# Patient Record
Sex: Female | Born: 1945
Health system: Southern US, Community
[De-identification: ages and names within clinical notes are randomized; demographics above are authoritative.]

## PROBLEM LIST (undated history)

## (undated) DIAGNOSIS — I10 Essential (primary) hypertension: Secondary | ICD-10-CM

## (undated) DIAGNOSIS — F419 Anxiety disorder, unspecified: Secondary | ICD-10-CM

## (undated) DIAGNOSIS — G4730 Sleep apnea, unspecified: Secondary | ICD-10-CM

## (undated) DIAGNOSIS — F32A Depression, unspecified: Secondary | ICD-10-CM

## (undated) DIAGNOSIS — T4145XA Adverse effect of unspecified anesthetic, initial encounter: Secondary | ICD-10-CM

## (undated) DIAGNOSIS — E785 Hyperlipidemia, unspecified: Secondary | ICD-10-CM

## (undated) DIAGNOSIS — T8859XA Other complications of anesthesia, initial encounter: Secondary | ICD-10-CM

## (undated) DIAGNOSIS — I639 Cerebral infarction, unspecified: Secondary | ICD-10-CM

## (undated) DIAGNOSIS — M8580 Other specified disorders of bone density and structure, unspecified site: Secondary | ICD-10-CM

## (undated) DIAGNOSIS — F329 Major depressive disorder, single episode, unspecified: Secondary | ICD-10-CM

## (undated) DIAGNOSIS — F0390 Unspecified dementia without behavioral disturbance: Secondary | ICD-10-CM

## (undated) DIAGNOSIS — M1990 Unspecified osteoarthritis, unspecified site: Secondary | ICD-10-CM

## (undated) HISTORY — PX: ROTATOR CUFF REPAIR: SHX139

## (undated) HISTORY — DX: Essential (primary) hypertension: I10

## (undated) HISTORY — PX: INTRACAPSULAR CATARACT EXTRACTION: SHX361

## (undated) HISTORY — DX: Hyperlipidemia, unspecified: E78.5

## (undated) HISTORY — DX: Cerebral infarction, unspecified: I63.9

## (undated) HISTORY — PX: COLONOSCOPY: SHX174

## (undated) HISTORY — DX: Unspecified dementia without behavioral disturbance: F03.90

## (undated) HISTORY — PX: TUBAL LIGATION: SHX77

## (undated) HISTORY — DX: Major depressive disorder, single episode, unspecified: F32.9

## (undated) HISTORY — DX: Other specified disorders of bone density and structure, unspecified site: M85.80

## (undated) HISTORY — DX: Depression, unspecified: F32.A

---
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
## 1997-07-11 ENCOUNTER — Other Ambulatory Visit: Admission: RE | Admit: 1997-07-11 | Discharge: 1997-07-11 | Payer: Self-pay | Admitting: Obstetrics and Gynecology

## 1998-02-18 ENCOUNTER — Other Ambulatory Visit: Admission: RE | Admit: 1998-02-18 | Discharge: 1998-02-18 | Payer: Self-pay | Admitting: Obstetrics and Gynecology

## 1998-09-01 ENCOUNTER — Other Ambulatory Visit: Admission: RE | Admit: 1998-09-01 | Discharge: 1998-09-01 | Payer: Self-pay | Admitting: Obstetrics and Gynecology

## 1999-03-23 ENCOUNTER — Other Ambulatory Visit: Admission: RE | Admit: 1999-03-23 | Discharge: 1999-03-23 | Payer: Self-pay | Admitting: Obstetrics and Gynecology

## 1999-06-22 ENCOUNTER — Encounter: Admission: RE | Admit: 1999-06-22 | Discharge: 1999-06-22 | Payer: Self-pay | Admitting: Obstetrics and Gynecology

## 1999-06-22 ENCOUNTER — Encounter: Payer: Self-pay | Admitting: Obstetrics and Gynecology

## 2000-04-08 ENCOUNTER — Other Ambulatory Visit: Admission: RE | Admit: 2000-04-08 | Discharge: 2000-04-08 | Payer: Self-pay | Admitting: Obstetrics and Gynecology

## 2000-06-23 ENCOUNTER — Encounter: Payer: Self-pay | Admitting: Obstetrics and Gynecology

## 2000-06-23 ENCOUNTER — Encounter: Admission: RE | Admit: 2000-06-23 | Discharge: 2000-06-23 | Payer: Self-pay | Admitting: Obstetrics and Gynecology

## 2001-04-14 ENCOUNTER — Other Ambulatory Visit: Admission: RE | Admit: 2001-04-14 | Discharge: 2001-04-14 | Payer: Self-pay | Admitting: Obstetrics and Gynecology

## 2001-07-13 ENCOUNTER — Encounter: Admission: RE | Admit: 2001-07-13 | Discharge: 2001-07-13 | Payer: Self-pay | Admitting: Obstetrics and Gynecology

## 2001-07-13 ENCOUNTER — Encounter: Payer: Self-pay | Admitting: Obstetrics and Gynecology

## 2002-06-14 ENCOUNTER — Other Ambulatory Visit: Admission: RE | Admit: 2002-06-14 | Discharge: 2002-06-14 | Payer: Self-pay | Admitting: Obstetrics and Gynecology

## 2002-11-28 ENCOUNTER — Encounter: Payer: Self-pay | Admitting: Obstetrics and Gynecology

## 2002-11-28 ENCOUNTER — Encounter: Admission: RE | Admit: 2002-11-28 | Discharge: 2002-11-28 | Payer: Self-pay | Admitting: Obstetrics and Gynecology

## 2003-08-06 ENCOUNTER — Other Ambulatory Visit: Admission: RE | Admit: 2003-08-06 | Discharge: 2003-08-06 | Payer: Self-pay | Admitting: Obstetrics and Gynecology

## 2003-12-03 ENCOUNTER — Encounter: Admission: RE | Admit: 2003-12-03 | Discharge: 2003-12-03 | Payer: Self-pay | Admitting: Obstetrics and Gynecology

## 2004-10-16 ENCOUNTER — Other Ambulatory Visit: Admission: RE | Admit: 2004-10-16 | Discharge: 2004-10-16 | Payer: Self-pay | Admitting: Addiction Medicine

## 2004-12-23 ENCOUNTER — Encounter: Admission: RE | Admit: 2004-12-23 | Discharge: 2004-12-23 | Payer: Self-pay | Admitting: Obstetrics and Gynecology

## 2005-01-04 ENCOUNTER — Encounter: Admission: RE | Admit: 2005-01-04 | Discharge: 2005-01-04 | Payer: Self-pay | Admitting: Obstetrics and Gynecology

## 2005-10-19 ENCOUNTER — Other Ambulatory Visit: Admission: RE | Admit: 2005-10-19 | Discharge: 2005-10-19 | Payer: Self-pay | Admitting: Obstetrics and Gynecology

## 2005-12-03 ENCOUNTER — Ambulatory Visit: Payer: Self-pay | Admitting: Internal Medicine

## 2005-12-13 ENCOUNTER — Ambulatory Visit: Payer: Self-pay | Admitting: Internal Medicine

## 2005-12-29 ENCOUNTER — Encounter: Admission: RE | Admit: 2005-12-29 | Discharge: 2005-12-29 | Payer: Self-pay | Admitting: Obstetrics and Gynecology

## 2006-10-25 ENCOUNTER — Other Ambulatory Visit: Admission: RE | Admit: 2006-10-25 | Discharge: 2006-10-25 | Payer: Self-pay | Admitting: Obstetrics and Gynecology

## 2006-10-25 ENCOUNTER — Encounter: Payer: Self-pay | Admitting: Obstetrics and Gynecology

## 2007-01-25 ENCOUNTER — Encounter: Admission: RE | Admit: 2007-01-25 | Discharge: 2007-01-25 | Payer: Self-pay | Admitting: Obstetrics and Gynecology

## 2007-12-19 ENCOUNTER — Other Ambulatory Visit: Admission: RE | Admit: 2007-12-19 | Discharge: 2007-12-19 | Payer: Self-pay | Admitting: Obstetrics and Gynecology

## 2008-01-29 ENCOUNTER — Encounter: Admission: RE | Admit: 2008-01-29 | Discharge: 2008-01-29 | Payer: Self-pay | Admitting: Internal Medicine

## 2008-12-25 ENCOUNTER — Ambulatory Visit: Payer: Self-pay | Admitting: Obstetrics and Gynecology

## 2008-12-25 ENCOUNTER — Other Ambulatory Visit: Admission: RE | Admit: 2008-12-25 | Discharge: 2008-12-25 | Payer: Self-pay | Admitting: Obstetrics and Gynecology

## 2008-12-25 ENCOUNTER — Encounter: Payer: Self-pay | Admitting: Obstetrics and Gynecology

## 2009-02-03 ENCOUNTER — Encounter: Admission: RE | Admit: 2009-02-03 | Discharge: 2009-02-03 | Payer: Self-pay | Admitting: Internal Medicine

## 2009-12-29 ENCOUNTER — Other Ambulatory Visit: Admission: RE | Admit: 2009-12-29 | Discharge: 2009-12-29 | Payer: Self-pay | Admitting: Obstetrics and Gynecology

## 2009-12-29 ENCOUNTER — Ambulatory Visit: Payer: Self-pay | Admitting: Obstetrics and Gynecology

## 2010-02-04 ENCOUNTER — Encounter: Admission: RE | Admit: 2010-02-04 | Discharge: 2010-02-04 | Payer: Self-pay | Admitting: Internal Medicine

## 2010-05-31 ENCOUNTER — Encounter: Payer: Self-pay | Admitting: Obstetrics and Gynecology

## 2010-12-31 ENCOUNTER — Other Ambulatory Visit (HOSPITAL_COMMUNITY)
Admission: RE | Admit: 2010-12-31 | Discharge: 2010-12-31 | Disposition: A | Discharge status: Home / Self Care | Payer: BC Managed Care – PPO | Source: Ambulatory Visit | Attending: Obstetrics and Gynecology | Admitting: Obstetrics and Gynecology

## 2010-12-31 ENCOUNTER — Encounter: Payer: Self-pay | Admitting: Obstetrics and Gynecology

## 2010-12-31 ENCOUNTER — Ambulatory Visit (INDEPENDENT_AMBULATORY_CARE_PROVIDER_SITE_OTHER): Payer: BC Managed Care – PPO | Admitting: Obstetrics and Gynecology

## 2010-12-31 VITALS — BP 110/64 | Ht 63.5 in | Wt 149.0 lb

## 2010-12-31 DIAGNOSIS — N952 Postmenopausal atrophic vaginitis: Secondary | ICD-10-CM

## 2010-12-31 DIAGNOSIS — I10 Essential (primary) hypertension: Secondary | ICD-10-CM | POA: Insufficient documentation

## 2010-12-31 DIAGNOSIS — M8580 Other specified disorders of bone density and structure, unspecified site: Secondary | ICD-10-CM | POA: Insufficient documentation

## 2010-12-31 DIAGNOSIS — Z01419 Encounter for gynecological examination (general) (routine) without abnormal findings: Secondary | ICD-10-CM

## 2010-12-31 DIAGNOSIS — N951 Menopausal and female climacteric states: Secondary | ICD-10-CM

## 2010-12-31 DIAGNOSIS — M899 Disorder of bone, unspecified: Secondary | ICD-10-CM

## 2010-12-31 DIAGNOSIS — Z780 Asymptomatic menopausal state: Secondary | ICD-10-CM

## 2010-12-31 DIAGNOSIS — E785 Hyperlipidemia, unspecified: Secondary | ICD-10-CM | POA: Insufficient documentation

## 2010-12-31 DIAGNOSIS — M949 Disorder of cartilage, unspecified: Secondary | ICD-10-CM

## 2010-12-31 NOTE — Progress Notes (Signed)
Patient came to see me today for further annual GYN care. She has vaginal dryness due to atrophic vaginitis but is not sexually active so does not need intervention. We are treating her with Prozac. She initially took it for PMS but she is now using a for menopausal symptoms with excellent results. She has osteopenia on bone density and is now doing them through her PCPs office. She takes calcium and D. She has had no fractures. She does not have loss of urine with stress. She is minimal nocturia. She is having no vaginal bleeding. She is having no pelvic pain. She is up-to-date on mammograms.  Past medical history, family history, and social history reviewed and in record.  ROS: 9 system ROS done. Other than pertinent positives listed above and hypertension and hypercholesterolemia there has been no change.  Physical examination: HEENT within normal limits. Neck: Thyroid not large. No masses. Supraclavicular nodes: not enlarged. Breasts: Examined in both sitting midline position. No skin changes and no masses. Abdomen: Soft no guarding rebound or masses or hernia. Pelvic: External: Within normal limits. BUS: Within normal limits. Vaginal:within normal limits. Good estrogen effect. No evidence of cystocele rectocele or enterocele. Cervix: clean. Uterus: Normal size and shape. Adnexa: No masses. Rectovaginal exam: Confirmatory and negative. Extremities: Within normal limits.  Assessment: 1. Menopausal symptoms 2. Atrophic vaginitis 3. Osteopenia  Plan: Continue Prozac. Continue mammograms.

## 2011-01-13 ENCOUNTER — Other Ambulatory Visit: Payer: Self-pay | Admitting: Internal Medicine

## 2011-01-13 DIAGNOSIS — Z1231 Encounter for screening mammogram for malignant neoplasm of breast: Secondary | ICD-10-CM

## 2011-02-08 ENCOUNTER — Ambulatory Visit
Admission: RE | Admit: 2011-02-08 | Discharge: 2011-02-08 | Disposition: A | Discharge status: Home / Self Care | Payer: BC Managed Care – PPO | Source: Ambulatory Visit | Attending: Internal Medicine | Admitting: Internal Medicine

## 2011-02-08 DIAGNOSIS — Z1231 Encounter for screening mammogram for malignant neoplasm of breast: Secondary | ICD-10-CM

## 2011-04-21 DIAGNOSIS — H268 Other specified cataract: Secondary | ICD-10-CM | POA: Insufficient documentation

## 2011-11-22 DIAGNOSIS — H40019 Open angle with borderline findings, low risk, unspecified eye: Secondary | ICD-10-CM | POA: Insufficient documentation

## 2012-01-03 ENCOUNTER — Encounter: Payer: Self-pay | Admitting: Obstetrics and Gynecology

## 2012-01-03 ENCOUNTER — Ambulatory Visit (INDEPENDENT_AMBULATORY_CARE_PROVIDER_SITE_OTHER): Payer: BC Managed Care – PPO | Admitting: Obstetrics and Gynecology

## 2012-01-03 VITALS — BP 124/78 | Ht 63.0 in | Wt 145.0 lb

## 2012-01-03 DIAGNOSIS — Z01419 Encounter for gynecological examination (general) (routine) without abnormal findings: Secondary | ICD-10-CM

## 2012-01-03 NOTE — Patient Instructions (Signed)
Schedule mammogram in September.

## 2012-01-03 NOTE — Progress Notes (Signed)
Patient came to see me today for her annual GYN exam. She is doing well without hormone replacement. She is having no vaginal bleeding. She is having no pelvic pain. She does her lab through PCP. She does have osteopenia but at the moment has not started medication. Last December she had a fall and tore  her right rotator cuff and the muscles around it. She has recuperated well. She's never had a abnormal Pap smear. Her last Pap smear was 2012. If she waits too long to go to the bathroom she does get some urgency and almost is incontinent. She has no nocturia. She has no dysuria.  Physical examination: Pamela Mason gardner present. HEENT within normal limits. Neck: Thyroid not large. No masses. Supraclavicular nodes: not enlarged. Breasts: Examined in both sitting and lying  position. No skin changes and no masses. Abdomen: Soft no guarding rebound or masses or hernia. Pelvic: External: Within normal limits. BUS: Within normal limits. Vaginal:within normal limits. Good estrogen effect. No evidence of cystocele rectocele or enterocele. Cervix: clean. Uterus: Normal size and shape. Adnexa: No masses. Rectovaginal exam: Confirmatory and negative. Extremities: Within normal limits.  Assessment: #1. Osteopenia #2. Urinary urgency  Plan: Mammogram in September. Continue bone densities with Dr. Waynard Mason. For the moment no treatment needed for urgency.The new Pap smear guidelines were discussed with the patient. No pap done.

## 2012-01-19 ENCOUNTER — Other Ambulatory Visit: Payer: Self-pay | Admitting: Internal Medicine

## 2012-01-19 DIAGNOSIS — Z1231 Encounter for screening mammogram for malignant neoplasm of breast: Secondary | ICD-10-CM

## 2012-02-10 ENCOUNTER — Ambulatory Visit
Admission: RE | Admit: 2012-02-10 | Discharge: 2012-02-10 | Disposition: A | Discharge status: Home / Self Care | Payer: BC Managed Care – PPO | Source: Ambulatory Visit | Attending: Internal Medicine | Admitting: Internal Medicine

## 2012-02-10 DIAGNOSIS — Z1231 Encounter for screening mammogram for malignant neoplasm of breast: Secondary | ICD-10-CM

## 2012-07-06 ENCOUNTER — Telehealth: Payer: Self-pay | Admitting: *Deleted

## 2012-07-06 NOTE — Telephone Encounter (Signed)
Prior Auth Approved 07-04-12 until 07-04-13

## 2013-01-03 ENCOUNTER — Encounter: Payer: Self-pay | Admitting: Gynecology

## 2013-01-03 ENCOUNTER — Ambulatory Visit (INDEPENDENT_AMBULATORY_CARE_PROVIDER_SITE_OTHER): Payer: 59 | Admitting: Gynecology

## 2013-01-03 VITALS — BP 124/78 | Ht 63.0 in | Wt 142.0 lb

## 2013-01-03 DIAGNOSIS — N952 Postmenopausal atrophic vaginitis: Secondary | ICD-10-CM

## 2013-01-03 DIAGNOSIS — M899 Disorder of bone, unspecified: Secondary | ICD-10-CM

## 2013-01-03 DIAGNOSIS — M8580 Other specified disorders of bone density and structure, unspecified site: Secondary | ICD-10-CM

## 2013-01-03 NOTE — Progress Notes (Signed)
Pamela Mason [facility] 11-Jun-1945 [id number]        67 y.o.  [id number] for followup exam.  Former patient of Dr. Eda Paschal. Several issues noted below.  Past medical history,surgical history, medications, allergies, family history and social history were all reviewed and documented in the EPIC chart.  ROS:  Performed and pertinent positives and negatives are included in the history, assessment and plan .  Exam: Kim assistant Filed Vitals:   01/03/13 1547  BP: 124/78  Height: 5\' 3"  (1.6 m)  Weight: 142 lb (64.411 kg)   General appearance  Normal Skin grossly normal Head/Neck normal with no cervical or supraclavicular adenopathy thyroid normal Lungs  clear Cardiac RR, without RMG Abdominal  soft, nontender, without masses, organomegaly or hernia Breasts  examined lying and sitting without masses, retractions, discharge or axillary adenopathy. Pelvic  Ext/BUS/vagina  normal with atrophic changes  Cervix  normal with atrophic changes  Uterus  axial, normal size, shape and contour, midline and mobile nontender   Adnexa  Without masses or tenderness    Anus and perineum  normal   Rectovaginal  normal sphincter tone without palpated masses or tenderness.    Assessment/Plan:  67 y.o. [id number] female for annual exam.   1. Postmenopausal/atrophic genital changes. Patient is asymptomatic without significant hot flashes, night sweats, vaginal dryness. Currently is not sexually active. We'll continue to monitor. 2. Osteopenia. Actively being followed by Dr. Waynard Edwards, recently started on alendronate. She will followup with him in reference to this. 3. Pap smear 2012. No Pap smear done today. No history of abnormal Pap smears previously. We'll plan repeat next year at 3 year interval. Options to stop screening altogether versus less frequent screening intervals discussed per current screening guidelines that she is at the age of 15. 4. Colonoscopy 2009 with recommended repeat at 10 years. 5. Mammography  02/2012. Continue with annual mammography this October. SBE monthly reviewed. 6. Health maintenance. No lab work done as it is all done through Dr. Dell Ponto office. Followup one year, sooner as needed.  Note: This document was prepared with digital dictation and possible smart phrase technology. Any transcriptional errors that result from this process are unintentional.   Dara Lords MD, 4:13 PM 01/03/2013

## 2013-01-03 NOTE — Patient Instructions (Signed)
Follow up in one year, sooner as needed. 

## 2013-01-31 ENCOUNTER — Other Ambulatory Visit: Payer: Self-pay | Admitting: Internal Medicine

## 2013-01-31 ENCOUNTER — Other Ambulatory Visit: Payer: Self-pay

## 2013-01-31 DIAGNOSIS — Z1231 Encounter for screening mammogram for malignant neoplasm of breast: Secondary | ICD-10-CM

## 2013-02-22 ENCOUNTER — Ambulatory Visit
Admission: RE | Admit: 2013-02-22 | Discharge: 2013-02-22 | Disposition: A | Discharge status: Home / Self Care | Payer: 59 | Source: Ambulatory Visit

## 2013-02-22 DIAGNOSIS — Z1231 Encounter for screening mammogram for malignant neoplasm of breast: Secondary | ICD-10-CM

## 2013-03-07 DIAGNOSIS — H109 Unspecified conjunctivitis: Secondary | ICD-10-CM | POA: Insufficient documentation

## 2013-04-02 ENCOUNTER — Other Ambulatory Visit: Payer: Self-pay | Admitting: Dermatology

## 2013-10-14 DIAGNOSIS — H35373 Puckering of macula, bilateral: Secondary | ICD-10-CM | POA: Insufficient documentation

## 2013-10-14 DIAGNOSIS — H43823 Vitreomacular adhesion, bilateral: Secondary | ICD-10-CM | POA: Insufficient documentation

## 2013-11-08 DIAGNOSIS — E538 Deficiency of other specified B group vitamins: Secondary | ICD-10-CM | POA: Diagnosis not present

## 2013-11-12 DIAGNOSIS — D1801 Hemangioma of skin and subcutaneous tissue: Secondary | ICD-10-CM | POA: Diagnosis not present

## 2013-11-12 DIAGNOSIS — L909 Atrophic disorder of skin, unspecified: Secondary | ICD-10-CM | POA: Diagnosis not present

## 2013-11-12 DIAGNOSIS — Z85820 Personal history of malignant melanoma of skin: Secondary | ICD-10-CM | POA: Diagnosis not present

## 2013-11-12 DIAGNOSIS — L919 Hypertrophic disorder of the skin, unspecified: Secondary | ICD-10-CM | POA: Diagnosis not present

## 2013-11-12 DIAGNOSIS — D239 Other benign neoplasm of skin, unspecified: Secondary | ICD-10-CM | POA: Diagnosis not present

## 2013-11-12 DIAGNOSIS — L821 Other seborrheic keratosis: Secondary | ICD-10-CM | POA: Diagnosis not present

## 2013-11-12 DIAGNOSIS — D235 Other benign neoplasm of skin of trunk: Secondary | ICD-10-CM | POA: Diagnosis not present

## 2014-01-04 ENCOUNTER — Encounter: Payer: 59 | Admitting: Gynecology

## 2014-01-10 DIAGNOSIS — E538 Deficiency of other specified B group vitamins: Secondary | ICD-10-CM | POA: Diagnosis not present

## 2014-01-15 ENCOUNTER — Encounter: Payer: Self-pay | Admitting: Gynecology

## 2014-01-15 ENCOUNTER — Ambulatory Visit (INDEPENDENT_AMBULATORY_CARE_PROVIDER_SITE_OTHER): Payer: Medicare Other | Admitting: Gynecology

## 2014-01-15 VITALS — BP 122/82 | Ht 63.5 in | Wt 141.2 lb

## 2014-01-15 DIAGNOSIS — N952 Postmenopausal atrophic vaginitis: Secondary | ICD-10-CM | POA: Diagnosis not present

## 2014-01-15 DIAGNOSIS — M8580 Other specified disorders of bone density and structure, unspecified site: Secondary | ICD-10-CM

## 2014-01-15 DIAGNOSIS — M899 Disorder of bone, unspecified: Secondary | ICD-10-CM | POA: Diagnosis not present

## 2014-01-15 DIAGNOSIS — M949 Disorder of cartilage, unspecified: Secondary | ICD-10-CM

## 2014-01-15 DIAGNOSIS — R82998 Other abnormal findings in urine: Secondary | ICD-10-CM | POA: Diagnosis not present

## 2014-01-15 DIAGNOSIS — Z124 Encounter for screening for malignant neoplasm of cervix: Secondary | ICD-10-CM

## 2014-01-15 NOTE — Progress Notes (Addendum)
Pamela Mason [facility] 11-Aug-1945 [id number]        68 y.o.  [id number] for follow up exam. Several issues that are below.  Past medical history,surgical history, problem list, medications, allergies, family history and social history were all reviewed and documented as reviewed in the EPIC chart.  ROS:  12 system ROS performed with pertinent positives and negatives included in the history, assessment and plan.   Additional significant findings :  none   Exam: Engineer, drilling Vitals:   01/15/14 1606  BP: 122/82  Height: 5' 3.5" (1.613 m)  Weight: 141 lb 3.2 oz (64.048 kg)   General appearance:  Normal affect, orientation and appearance. Skin: Grossly normal HEENT: Without gross lesions.  No cervical or supraclavicular adenopathy. Thyroid normal.  Lungs:  Clear without wheezing, rales or rhonchi Cardiac: RR, without RMG Abdominal:  Soft, nontender, without masses, guarding, rebound, organomegaly or hernia Breasts:  Examined lying and sitting without masses, retractions, discharge or axillary adenopathy. Pelvic:  Ext/BUS/vagina with generalized atrophic changes  Cervix with atrophic changes  Uterus axial to anteverted, normal size, shape and contour, midline and mobile nontender   Adnexa  Without masses or tenderness    Anus and perineum  Normal   Rectovaginal  Normal sphincter tone without palpated masses or tenderness.    Assessment/Plan:  68 y.o. [id number] female for follow up exam.  1. Postmenopausal/atrophic genital changes. Patient doing well without significant symptoms of hot flushes, night sweats, vaginal dryness. Is not sexually active. No vaginal bleeding. Continue to monitor. Report any vaginal bleeding. 2. Osteopenia. Actively being followed by Dr. Joylene Draft on Fosamax. Will continue to follow up with him in reference to this and ongoing DEXA studies. Increased calcium vitamin D reviewed. 3. Pap smear 2012. Pap done today. No history of abnormal Pap smears. Options to stop  screening altogether she is over the age of 79 versus less for screening intervals reviewed. Will readdress on an annual basis. 4. Mammography coming due in November and I reminded her to schedule this. SBE monthly reviewed. 5. Colonoscopy reported current and she will follow up for repeat at their recommended interval. 6. Health maintenance.  No routine blood work done as she has this done at Dr. Silvestre Mesi office. Follow up one year, sooner as needed.   Note: This document was prepared with digital dictation and possible smart phrase technology. Any transcriptional errors that result from this process are unintentional.   Anastasio Auerbach MD, 4:22 PM 01/15/2014

## 2014-01-15 NOTE — Patient Instructions (Signed)
Followup in one year for annual exam.  You may obtain a copy of any labs that were done today by logging onto MyChart as outlined in the instructions provided with your AVS (after visit summary). The office will not call with normal lab results but certainly if there are any significant abnormalities then we will contact you.   Health Maintenance, Female A healthy lifestyle and preventative care can promote health and wellness.  Maintain regular health, dental, and eye exams.  Eat a healthy diet. Foods like vegetables, fruits, whole grains, low-fat dairy products, and lean protein foods contain the nutrients you need without too many calories. Decrease your intake of foods high in solid fats, added sugars, and salt. Get information about a proper diet from your caregiver, if necessary.  Regular physical exercise is one of the most important things you can do for your health. Most adults should get at least 150 minutes of moderate-intensity exercise (any activity that increases your heart rate and causes you to sweat) each week. In addition, most adults need muscle-strengthening exercises on 2 or more days a week.   Maintain a healthy weight. The body mass index (BMI) is a screening tool to identify possible weight problems. It provides an estimate of body fat based on height and weight. Your caregiver can help determine your BMI, and can help you achieve or maintain a healthy weight. For adults 20 years and older:  A BMI below 18.5 is considered underweight.  A BMI of 18.5 to 24.9 is normal.  A BMI of 25 to 29.9 is considered overweight.  A BMI of 30 and above is considered obese.  Maintain normal blood lipids and cholesterol by exercising and minimizing your intake of saturated fat. Eat a balanced diet with plenty of fruits and vegetables. Blood tests for lipids and cholesterol should begin at age 20 and be repeated every 5 years. If your lipid or cholesterol levels are high, you are over  50, or you are a high risk for heart disease, you may need your cholesterol levels checked more frequently.Ongoing high lipid and cholesterol levels should be treated with medicines if diet and exercise are not effective.  If you smoke, find out from your caregiver how to quit. If you do not use tobacco, do not start.  Lung cancer screening is recommended for adults aged 55 80 years who are at high risk for developing lung cancer because of a history of smoking. Yearly low-dose computed tomography (CT) is recommended for people who have at least a 30-pack-year history of smoking and are a current smoker or have quit within the past 15 years. A pack year of smoking is smoking an average of 1 pack of cigarettes a day for 1 year (for example: 1 pack a day for 30 years or 2 packs a day for 15 years). Yearly screening should continue until the smoker has stopped smoking for at least 15 years. Yearly screening should also be stopped for people who develop a health problem that would prevent them from having lung cancer treatment.  If you are pregnant, do not drink alcohol. If you are breastfeeding, be very cautious about drinking alcohol. If you are not pregnant and choose to drink alcohol, do not exceed 1 drink per day. One drink is considered to be 12 ounces (355 mL) of beer, 5 ounces (148 mL) of wine, or 1.5 ounces (44 mL) of liquor.  Avoid use of street drugs. Do not share needles with anyone. Ask for help   if you need support or instructions about stopping the use of drugs.  High blood pressure causes heart disease and increases the risk of stroke. Blood pressure should be checked at least every 1 to 2 years. Ongoing high blood pressure should be treated with medicines, if weight loss and exercise are not effective.  If you are 55 to 69 years old, ask your caregiver if you should take aspirin to prevent strokes.  Diabetes screening involves taking a blood sample to check your fasting blood sugar level.  This should be done once every 3 years, after age 45, if you are within normal weight and without risk factors for diabetes. Testing should be considered at a younger age or be carried out more frequently if you are overweight and have at least 1 risk factor for diabetes.  Breast cancer screening is essential preventative care for women. You should practice "breast self-awareness." This means understanding the normal appearance and feel of your breasts and may include breast self-examination. Any changes detected, no matter how small, should be reported to a caregiver. Women in their 20s and 30s should have a clinical breast exam (CBE) by a caregiver as part of a regular health exam every 1 to 3 years. After age 40, women should have a CBE every year. Starting at age 40, women should consider having a mammogram (breast X-ray) every year. Women who have a family history of breast cancer should talk to their caregiver about genetic screening. Women at a high risk of breast cancer should talk to their caregiver about having an MRI and a mammogram every year.  Breast cancer gene (BRCA)-related cancer risk assessment is recommended for women who have family members with BRCA-related cancers. BRCA-related cancers include breast, ovarian, tubal, and peritoneal cancers. Having family members with these cancers may be associated with an increased risk for harmful changes (mutations) in the breast cancer genes BRCA1 and BRCA2. Results of the assessment will determine the need for genetic counseling and BRCA1 and BRCA2 testing.  The Pap test is a screening test for cervical cancer. Women should have a Pap test starting at age 21. Between ages 21 and 29, Pap tests should be repeated every 2 years. Beginning at age 30, you should have a Pap test every 3 years as long as the past 3 Pap tests have been normal. If you had a hysterectomy for a problem that was not cancer or a condition that could lead to cancer, then you no  longer need Pap tests. If you are between ages 65 and 70, and you have had normal Pap tests going back 10 years, you no longer need Pap tests. If you have had past treatment for cervical cancer or a condition that could lead to cancer, you need Pap tests and screening for cancer for at least 20 years after your treatment. If Pap tests have been discontinued, risk factors (such as a new sexual partner) need to be reassessed to determine if screening should be resumed. Some women have medical problems that increase the chance of getting cervical cancer. In these cases, your caregiver may recommend more frequent screening and Pap tests.  The human papillomavirus (HPV) test is an additional test that may be used for cervical cancer screening. The HPV test looks for the virus that can cause the cell changes on the cervix. The cells collected during the Pap test can be tested for HPV. The HPV test could be used to screen women aged 30 years and older, and   should be used in women of any age who have unclear Pap test results. After the age of 30, women should have HPV testing at the same frequency as a Pap test.  Colorectal cancer can be detected and often prevented. Most routine colorectal cancer screening begins at the age of 50 and continues through age 75. However, your caregiver may recommend screening at an earlier age if you have risk factors for colon cancer. On a yearly basis, your caregiver may provide home test kits to check for hidden blood in the stool. Use of a small camera at the end of a tube, to directly examine the colon (sigmoidoscopy or colonoscopy), can detect the earliest forms of colorectal cancer. Talk to your caregiver about this at age 50, when routine screening begins. Direct examination of the colon should be repeated every 5 to 10 years through age 75, unless early forms of pre-cancerous polyps or small growths are found.  Hepatitis C blood testing is recommended for all people born from  1945 through 1965 and any individual with known risks for hepatitis C.  Practice safe sex. Use condoms and avoid high-risk sexual practices to reduce the spread of sexually transmitted infections (STIs). Sexually active women aged 25 and younger should be checked for Chlamydia, which is a common sexually transmitted infection. Older women with new or multiple partners should also be tested for Chlamydia. Testing for other STIs is recommended if you are sexually active and at increased risk.  Osteoporosis is a disease in which the bones lose minerals and strength with aging. This can result in serious bone fractures. The risk of osteoporosis can be identified using a bone density scan. Women ages 65 and over and women at risk for fractures or osteoporosis should discuss screening with their caregivers. Ask your caregiver whether you should be taking a calcium supplement or vitamin D to reduce the rate of osteoporosis.  Menopause can be associated with physical symptoms and risks. Hormone replacement therapy is available to decrease symptoms and risks. You should talk to your caregiver about whether hormone replacement therapy is right for you.  Use sunscreen. Apply sunscreen liberally and repeatedly throughout the day. You should seek shade when your shadow is shorter than you. Protect yourself by wearing long sleeves, pants, a wide-brimmed hat, and sunglasses year round, whenever you are outdoors.  Notify your caregiver of new moles or changes in moles, especially if there is a change in shape or color. Also notify your caregiver if a mole is larger than the size of a pencil eraser.  Stay current with your immunizations. Document Released: 11/09/2010 Document Revised: 08/21/2012 Document Reviewed: 11/09/2010 [employer] Patient Information 2014 [employer].   

## 2014-01-17 ENCOUNTER — Other Ambulatory Visit (HOSPITAL_COMMUNITY)
Admission: RE | Admit: 2014-01-17 | Discharge: 2014-01-17 | Disposition: A | Discharge status: Home / Self Care | Payer: Medicare Other | Source: Ambulatory Visit | Attending: Gynecology | Admitting: Gynecology

## 2014-01-17 DIAGNOSIS — Z124 Encounter for screening for malignant neoplasm of cervix: Secondary | ICD-10-CM | POA: Diagnosis present

## 2014-01-17 LAB — URINALYSIS W MICROSCOPIC + REFLEX CULTURE
Bilirubin Urine: NEGATIVE
Casts: NONE SEEN
Glucose, UA: NEGATIVE mg/dL
HGB URINE DIPSTICK: NEGATIVE
Ketones, ur: NEGATIVE mg/dL
Nitrite: POSITIVE — AB
PROTEIN: NEGATIVE mg/dL
Specific Gravity, Urine: 1.03 (ref 1.005–1.030)
UROBILINOGEN UA: 0.2 mg/dL (ref 0.0–1.0)
pH: 5.5 (ref 5.0–8.0)

## 2014-01-17 NOTE — Addendum Note (Signed)
Addended by: Alen Blew on: 01/17/2014 09:38 AM   Modules accepted: Orders

## 2014-01-18 ENCOUNTER — Other Ambulatory Visit: Payer: Self-pay | Admitting: Gynecology

## 2014-01-18 LAB — CYTOLOGY - PAP

## 2014-01-18 MED ORDER — SULFAMETHOXAZOLE-TMP DS 800-160 MG PO TABS: 1.0000 | ORAL_TABLET | Freq: Two times a day (BID) | ORAL | Status: DC

## 2014-01-22 LAB — URINE CULTURE

## 2014-02-19 DIAGNOSIS — H6120 Impacted cerumen, unspecified ear: Secondary | ICD-10-CM | POA: Diagnosis not present

## 2014-02-19 DIAGNOSIS — I10 Essential (primary) hypertension: Secondary | ICD-10-CM | POA: Diagnosis not present

## 2014-02-19 DIAGNOSIS — H9192 Unspecified hearing loss, left ear: Secondary | ICD-10-CM | POA: Diagnosis not present

## 2014-02-19 DIAGNOSIS — Z6825 Body mass index (BMI) 25.0-25.9, adult: Secondary | ICD-10-CM | POA: Diagnosis not present

## 2014-03-01 ENCOUNTER — Other Ambulatory Visit: Payer: Self-pay

## 2014-03-01 DIAGNOSIS — Z1231 Encounter for screening mammogram for malignant neoplasm of breast: Secondary | ICD-10-CM

## 2014-03-07 DIAGNOSIS — Z23 Encounter for immunization: Secondary | ICD-10-CM | POA: Diagnosis not present

## 2014-03-07 DIAGNOSIS — E538 Deficiency of other specified B group vitamins: Secondary | ICD-10-CM | POA: Diagnosis not present

## 2014-03-11 ENCOUNTER — Encounter: Payer: Self-pay | Admitting: Gynecology

## 2014-03-15 ENCOUNTER — Ambulatory Visit: Payer: 59

## 2014-03-18 ENCOUNTER — Ambulatory Visit
Admission: RE | Admit: 2014-03-18 | Discharge: 2014-03-18 | Disposition: A | Discharge status: Home / Self Care | Payer: Medicare Other | Source: Ambulatory Visit

## 2014-03-18 ENCOUNTER — Encounter (INDEPENDENT_AMBULATORY_CARE_PROVIDER_SITE_OTHER): Payer: Self-pay

## 2014-03-18 DIAGNOSIS — Z1231 Encounter for screening mammogram for malignant neoplasm of breast: Secondary | ICD-10-CM

## 2014-03-19 ENCOUNTER — Ambulatory Visit: Payer: 59

## 2014-04-11 DIAGNOSIS — Z0000 Encounter for general adult medical examination without abnormal findings: Secondary | ICD-10-CM | POA: Diagnosis not present

## 2014-04-11 DIAGNOSIS — E785 Hyperlipidemia, unspecified: Secondary | ICD-10-CM | POA: Diagnosis not present

## 2014-04-11 DIAGNOSIS — N390 Urinary tract infection, site not specified: Secondary | ICD-10-CM | POA: Diagnosis not present

## 2014-04-11 DIAGNOSIS — R7301 Impaired fasting glucose: Secondary | ICD-10-CM | POA: Diagnosis not present

## 2014-04-11 DIAGNOSIS — E559 Vitamin D deficiency, unspecified: Secondary | ICD-10-CM | POA: Diagnosis not present

## 2014-04-11 DIAGNOSIS — E538 Deficiency of other specified B group vitamins: Secondary | ICD-10-CM | POA: Diagnosis not present

## 2014-04-11 DIAGNOSIS — Z008 Encounter for other general examination: Secondary | ICD-10-CM | POA: Diagnosis not present

## 2014-04-11 DIAGNOSIS — M8580 Other specified disorders of bone density and structure, unspecified site: Secondary | ICD-10-CM | POA: Diagnosis not present

## 2014-04-11 DIAGNOSIS — I10 Essential (primary) hypertension: Secondary | ICD-10-CM | POA: Diagnosis not present

## 2014-04-18 DIAGNOSIS — Z0000 Encounter for general adult medical examination without abnormal findings: Secondary | ICD-10-CM | POA: Diagnosis not present

## 2014-04-18 DIAGNOSIS — R7881 Bacteremia: Secondary | ICD-10-CM | POA: Diagnosis not present

## 2014-04-18 DIAGNOSIS — E785 Hyperlipidemia, unspecified: Secondary | ICD-10-CM | POA: Diagnosis not present

## 2014-04-18 DIAGNOSIS — N390 Urinary tract infection, site not specified: Secondary | ICD-10-CM | POA: Diagnosis not present

## 2014-04-18 DIAGNOSIS — E538 Deficiency of other specified B group vitamins: Secondary | ICD-10-CM | POA: Diagnosis not present

## 2014-04-18 DIAGNOSIS — R7301 Impaired fasting glucose: Secondary | ICD-10-CM | POA: Diagnosis not present

## 2014-04-18 DIAGNOSIS — F329 Major depressive disorder, single episode, unspecified: Secondary | ICD-10-CM | POA: Diagnosis not present

## 2014-04-18 DIAGNOSIS — Z6823 Body mass index (BMI) 23.0-23.9, adult: Secondary | ICD-10-CM | POA: Diagnosis not present

## 2014-04-18 DIAGNOSIS — L309 Dermatitis, unspecified: Secondary | ICD-10-CM | POA: Diagnosis not present

## 2014-04-18 DIAGNOSIS — I447 Left bundle-branch block, unspecified: Secondary | ICD-10-CM | POA: Diagnosis not present

## 2014-04-18 DIAGNOSIS — I10 Essential (primary) hypertension: Secondary | ICD-10-CM | POA: Diagnosis not present

## 2014-05-15 DIAGNOSIS — E538 Deficiency of other specified B group vitamins: Secondary | ICD-10-CM | POA: Diagnosis not present

## 2014-05-16 DIAGNOSIS — Z1212 Encounter for screening for malignant neoplasm of rectum: Secondary | ICD-10-CM | POA: Diagnosis not present

## 2014-07-11 DIAGNOSIS — E538 Deficiency of other specified B group vitamins: Secondary | ICD-10-CM | POA: Diagnosis not present

## 2014-08-27 DIAGNOSIS — M25561 Pain in right knee: Secondary | ICD-10-CM | POA: Diagnosis not present

## 2014-08-27 DIAGNOSIS — M1711 Unilateral primary osteoarthritis, right knee: Secondary | ICD-10-CM | POA: Diagnosis not present

## 2014-08-30 DIAGNOSIS — H25812 Combined forms of age-related cataract, left eye: Secondary | ICD-10-CM | POA: Insufficient documentation

## 2014-09-10 DIAGNOSIS — E538 Deficiency of other specified B group vitamins: Secondary | ICD-10-CM | POA: Diagnosis not present

## 2014-09-24 DIAGNOSIS — H25811 Combined forms of age-related cataract, right eye: Secondary | ICD-10-CM | POA: Diagnosis not present

## 2014-09-24 DIAGNOSIS — M810 Age-related osteoporosis without current pathological fracture: Secondary | ICD-10-CM | POA: Insufficient documentation

## 2014-09-24 DIAGNOSIS — F419 Anxiety disorder, unspecified: Secondary | ICD-10-CM | POA: Insufficient documentation

## 2014-10-01 DIAGNOSIS — F419 Anxiety disorder, unspecified: Secondary | ICD-10-CM | POA: Diagnosis not present

## 2014-10-01 DIAGNOSIS — Z6824 Body mass index (BMI) 24.0-24.9, adult: Secondary | ICD-10-CM | POA: Diagnosis not present

## 2014-10-01 DIAGNOSIS — E785 Hyperlipidemia, unspecified: Secondary | ICD-10-CM | POA: Diagnosis not present

## 2014-10-01 DIAGNOSIS — H25811 Combined forms of age-related cataract, right eye: Secondary | ICD-10-CM | POA: Diagnosis not present

## 2014-10-01 DIAGNOSIS — M179 Osteoarthritis of knee, unspecified: Secondary | ICD-10-CM | POA: Diagnosis not present

## 2014-10-01 DIAGNOSIS — M810 Age-related osteoporosis without current pathological fracture: Secondary | ICD-10-CM | POA: Diagnosis not present

## 2014-10-01 DIAGNOSIS — I10 Essential (primary) hypertension: Secondary | ICD-10-CM | POA: Diagnosis not present

## 2014-12-20 ENCOUNTER — Encounter: Payer: Self-pay | Admitting: Internal Medicine

## 2014-12-23 DIAGNOSIS — D1801 Hemangioma of skin and subcutaneous tissue: Secondary | ICD-10-CM | POA: Diagnosis not present

## 2014-12-23 DIAGNOSIS — L918 Other hypertrophic disorders of the skin: Secondary | ICD-10-CM | POA: Diagnosis not present

## 2014-12-23 DIAGNOSIS — Z85820 Personal history of malignant melanoma of skin: Secondary | ICD-10-CM | POA: Diagnosis not present

## 2014-12-23 DIAGNOSIS — L300 Nummular dermatitis: Secondary | ICD-10-CM | POA: Diagnosis not present

## 2014-12-23 DIAGNOSIS — D225 Melanocytic nevi of trunk: Secondary | ICD-10-CM | POA: Diagnosis not present

## 2014-12-23 DIAGNOSIS — L814 Other melanin hyperpigmentation: Secondary | ICD-10-CM | POA: Diagnosis not present

## 2014-12-23 DIAGNOSIS — L821 Other seborrheic keratosis: Secondary | ICD-10-CM | POA: Diagnosis not present

## 2014-12-23 DIAGNOSIS — L738 Other specified follicular disorders: Secondary | ICD-10-CM | POA: Diagnosis not present

## 2014-12-26 DIAGNOSIS — Z7982 Long term (current) use of aspirin: Secondary | ICD-10-CM | POA: Diagnosis not present

## 2014-12-26 DIAGNOSIS — E785 Hyperlipidemia, unspecified: Secondary | ICD-10-CM | POA: Diagnosis not present

## 2014-12-26 DIAGNOSIS — I10 Essential (primary) hypertension: Secondary | ICD-10-CM | POA: Diagnosis not present

## 2014-12-26 DIAGNOSIS — M810 Age-related osteoporosis without current pathological fracture: Secondary | ICD-10-CM | POA: Diagnosis not present

## 2014-12-26 DIAGNOSIS — H25812 Combined forms of age-related cataract, left eye: Secondary | ICD-10-CM | POA: Diagnosis not present

## 2014-12-26 DIAGNOSIS — Z79899 Other long term (current) drug therapy: Secondary | ICD-10-CM | POA: Diagnosis not present

## 2014-12-27 DIAGNOSIS — Z961 Presence of intraocular lens: Secondary | ICD-10-CM | POA: Insufficient documentation

## 2014-12-27 DIAGNOSIS — Z9842 Cataract extraction status, left eye: Secondary | ICD-10-CM | POA: Diagnosis not present

## 2014-12-27 DIAGNOSIS — Z9841 Cataract extraction status, right eye: Secondary | ICD-10-CM | POA: Diagnosis not present

## 2014-12-27 DIAGNOSIS — Z48810 Encounter for surgical aftercare following surgery on the sense organs: Secondary | ICD-10-CM | POA: Diagnosis not present

## 2014-12-31 DIAGNOSIS — E538 Deficiency of other specified B group vitamins: Secondary | ICD-10-CM | POA: Diagnosis not present

## 2015-01-17 ENCOUNTER — Ambulatory Visit (INDEPENDENT_AMBULATORY_CARE_PROVIDER_SITE_OTHER): Payer: Medicare Other | Admitting: Gynecology

## 2015-01-17 ENCOUNTER — Encounter: Payer: Self-pay | Admitting: Gynecology

## 2015-01-17 VITALS — BP 120/78 | Ht 63.0 in | Wt 143.0 lb

## 2015-01-17 DIAGNOSIS — Z01419 Encounter for gynecological examination (general) (routine) without abnormal findings: Secondary | ICD-10-CM

## 2015-01-17 DIAGNOSIS — N952 Postmenopausal atrophic vaginitis: Secondary | ICD-10-CM

## 2015-01-17 DIAGNOSIS — M8580 Other specified disorders of bone density and structure, unspecified site: Secondary | ICD-10-CM | POA: Diagnosis not present

## 2015-01-17 NOTE — Patient Instructions (Signed)

## 2015-01-17 NOTE — Progress Notes (Signed)
Pamela Mason [facility] Mar 23, 1946 [id number]        69 y.o.  [id number] for breast and pelvic exam.  Past medical history,surgical history, problem list, medications, allergies, family history and social history were all reviewed and documented as reviewed in the EPIC chart.  ROS:  Performed with pertinent positives and negatives included in the history, assessment and plan.   Additional significant findings :  none   Exam: [name] Vitals:   01/17/15 0845  BP: 120/78  Height: 5\' 3"  (1.6 m)  Weight: 143 lb (64.864 kg)   General appearance:  Normal affect, orientation and appearance. Skin: Grossly normal HEENT: Without gross lesions.  No cervical or supraclavicular adenopathy. Thyroid normal.  Lungs:  Clear without wheezing, rales or rhonchi Cardiac: RR, without RMG Abdominal:  Soft, nontender, without masses, guarding, rebound, organomegaly or hernia Breasts:  Examined lying and sitting without masses, retractions, discharge or axillary adenopathy. Pelvic:  Ext/BUS/vagina with atrophic changes  Cervix with atrophic changes  Uterus axial to anteverted, normal size, shape and contour, midline and mobile nontender   Adnexa  Without masses or tenderness    Anus and perineum  Normal   Rectovaginal  Normal sphincter tone without palpated masses or tenderness.    Assessment/Plan:  69 y.o. [id number] female for breast and pelvic exam.   1. Postmenopausal/atrophic genital changes. Patient without significant hot flushes, night sweats, vaginal dryness or any vaginal bleeding. Continue to monitor and report any vaginal bleeding. 2. Osteopenia. Followed by Dr. Joylene Draft on Fosamax. I do not have copies of her bone densities and she'll continue to follow up with him in reference to this. 3. Pap smear 2015. No Pap smear done today. Options to stop screening issues over the age of 6 with no history of abnormal Pap smears versus less frequent screening intervals reviewed. Will readdress on  annual basis. 4. Mammography coming due in November and I reminded her to schedule this. SBE monthly reviewed. 5. Colonoscopy approaching 10 years ago. She is going to follow up with the gastroenterologist to arrange for her colonoscopy. 6. Health maintenance. No routine blood work done as this is done at her primary physician's office. Follow up in one year, sooner as needed.   Anastasio Auerbach MD, 9:17 AM 01/17/2015

## 2015-02-15 DIAGNOSIS — Z23 Encounter for immunization: Secondary | ICD-10-CM | POA: Diagnosis not present

## 2015-02-18 DIAGNOSIS — E538 Deficiency of other specified B group vitamins: Secondary | ICD-10-CM | POA: Diagnosis not present

## 2015-02-19 ENCOUNTER — Other Ambulatory Visit: Payer: Self-pay

## 2015-02-19 DIAGNOSIS — Z1231 Encounter for screening mammogram for malignant neoplasm of breast: Secondary | ICD-10-CM

## 2015-03-18 DIAGNOSIS — M859 Disorder of bone density and structure, unspecified: Secondary | ICD-10-CM | POA: Diagnosis not present

## 2015-03-21 ENCOUNTER — Ambulatory Visit
Admission: RE | Admit: 2015-03-21 | Discharge: 2015-03-21 | Disposition: A | Discharge status: Home / Self Care | Payer: Medicare Other | Source: Ambulatory Visit

## 2015-03-21 DIAGNOSIS — Z1231 Encounter for screening mammogram for malignant neoplasm of breast: Secondary | ICD-10-CM | POA: Diagnosis not present

## 2015-05-16 DIAGNOSIS — H40003 Preglaucoma, unspecified, bilateral: Secondary | ICD-10-CM | POA: Diagnosis not present

## 2015-05-22 DIAGNOSIS — N390 Urinary tract infection, site not specified: Secondary | ICD-10-CM | POA: Diagnosis not present

## 2015-05-22 DIAGNOSIS — R7301 Impaired fasting glucose: Secondary | ICD-10-CM | POA: Diagnosis not present

## 2015-05-22 DIAGNOSIS — E784 Other hyperlipidemia: Secondary | ICD-10-CM | POA: Diagnosis not present

## 2015-05-22 DIAGNOSIS — I10 Essential (primary) hypertension: Secondary | ICD-10-CM | POA: Diagnosis not present

## 2015-05-22 DIAGNOSIS — E538 Deficiency of other specified B group vitamins: Secondary | ICD-10-CM | POA: Diagnosis not present

## 2015-05-22 DIAGNOSIS — E559 Vitamin D deficiency, unspecified: Secondary | ICD-10-CM | POA: Diagnosis not present

## 2015-05-22 DIAGNOSIS — R8299 Other abnormal findings in urine: Secondary | ICD-10-CM | POA: Diagnosis not present

## 2015-05-28 DIAGNOSIS — F329 Major depressive disorder, single episode, unspecified: Secondary | ICD-10-CM | POA: Diagnosis not present

## 2015-05-28 DIAGNOSIS — R7301 Impaired fasting glucose: Secondary | ICD-10-CM | POA: Diagnosis not present

## 2015-05-28 DIAGNOSIS — Z85820 Personal history of malignant melanoma of skin: Secondary | ICD-10-CM | POA: Diagnosis not present

## 2015-05-28 DIAGNOSIS — E538 Deficiency of other specified B group vitamins: Secondary | ICD-10-CM | POA: Diagnosis not present

## 2015-05-28 DIAGNOSIS — Z6825 Body mass index (BMI) 25.0-25.9, adult: Secondary | ICD-10-CM | POA: Diagnosis not present

## 2015-05-28 DIAGNOSIS — I10 Essential (primary) hypertension: Secondary | ICD-10-CM | POA: Diagnosis not present

## 2015-05-28 DIAGNOSIS — E784 Other hyperlipidemia: Secondary | ICD-10-CM | POA: Diagnosis not present

## 2015-05-28 DIAGNOSIS — Z0000 Encounter for general adult medical examination without abnormal findings: Secondary | ICD-10-CM | POA: Diagnosis not present

## 2015-05-28 DIAGNOSIS — Z1389 Encounter for screening for other disorder: Secondary | ICD-10-CM | POA: Diagnosis not present

## 2015-05-28 DIAGNOSIS — N390 Urinary tract infection, site not specified: Secondary | ICD-10-CM | POA: Diagnosis not present

## 2015-05-30 DIAGNOSIS — Z1212 Encounter for screening for malignant neoplasm of rectum: Secondary | ICD-10-CM | POA: Diagnosis not present

## 2015-07-30 DIAGNOSIS — H268 Other specified cataract: Secondary | ICD-10-CM | POA: Diagnosis not present

## 2015-07-30 DIAGNOSIS — Z961 Presence of intraocular lens: Secondary | ICD-10-CM | POA: Diagnosis not present

## 2015-08-14 DIAGNOSIS — N390 Urinary tract infection, site not specified: Secondary | ICD-10-CM | POA: Diagnosis not present

## 2015-08-14 DIAGNOSIS — R8299 Other abnormal findings in urine: Secondary | ICD-10-CM | POA: Diagnosis not present

## 2015-09-25 DIAGNOSIS — E538 Deficiency of other specified B group vitamins: Secondary | ICD-10-CM | POA: Diagnosis not present

## 2015-09-25 DIAGNOSIS — E784 Other hyperlipidemia: Secondary | ICD-10-CM | POA: Diagnosis not present

## 2015-09-25 DIAGNOSIS — N390 Urinary tract infection, site not specified: Secondary | ICD-10-CM | POA: Diagnosis not present

## 2015-12-31 ENCOUNTER — Encounter: Payer: Self-pay | Admitting: Internal Medicine

## 2016-01-19 ENCOUNTER — Encounter: Payer: Self-pay | Admitting: Gynecology

## 2016-01-19 ENCOUNTER — Ambulatory Visit (INDEPENDENT_AMBULATORY_CARE_PROVIDER_SITE_OTHER): Payer: Medicare Other | Admitting: Gynecology

## 2016-01-19 VITALS — BP 120/76 | Ht 63.0 in | Wt 144.0 lb

## 2016-01-19 DIAGNOSIS — N952 Postmenopausal atrophic vaginitis: Secondary | ICD-10-CM

## 2016-01-19 DIAGNOSIS — Z01419 Encounter for gynecological examination (general) (routine) without abnormal findings: Secondary | ICD-10-CM | POA: Diagnosis not present

## 2016-01-19 NOTE — Progress Notes (Signed)
    Pamela Mason [facility] [birth year] [id number]        70 y.o.  [id number]  for breast and pelvic exam  Past medical history,surgical history, problem list, medications, allergies, family history and social history were all reviewed and documented as reviewed in the EPIC chart.  ROS:  Performed with pertinent positives and negatives included in the history, assessment and plan.   Additional significant findings :  None   Exam: Pamela Mason assistant Vitals:   01/19/16 0928  BP: 120/76  Weight: 144 lb (65.3 kg)  Height: 5\' 3"  (1.6 m)   Body mass index is 25.51 kg/m.  General appearance:  Normal affect, orientation and appearance. Skin: Grossly normal HEENT: Without gross lesions.  No cervical or supraclavicular adenopathy. Thyroid normal.  Lungs:  Clear without wheezing, rales or rhonchi Cardiac: RR, without RMG Abdominal:  Soft, nontender, without masses, guarding, rebound, organomegaly or hernia Breasts:  Examined lying and sitting without masses, retractions, discharge or axillary adenopathy. Pelvic:  Ext/BUS/Vagina with atrophic changes  Cervix with atrophic changes  Uterus anteverted, normal size, shape and contour, midline and mobile nontender   Adnexa without masses or tenderness    Anus and perineum normal   Rectovaginal normal sphincter tone without palpated masses or tenderness.    Assessment/Plan:  70 y.o. [id number] female for breast and pelvic exam  1. Postmenopausal/atrophic genital changes. No significant hot flushes, night sweats, vaginal dryness or any vaginal bleeding. Continue to monitor and report any issues or vaginal bleeding. 2. Osteopenia followed by Dr. Joylene Mason on Fosamax. I do not have copies of her bone densities. She'll continue to follow up with Dr. Joylene Mason in reference to her bone health. 3. Colonoscopy 2007. Reviewed with her that she is due for repeat now. She states that she is going to discuss with Dr. Joylene Mason and schedule. 4. Mammography 03/2015. Reminded  her that she is coming due. SBE monthly reviewed. 5. Pap smear 01/2014. No Pap smear done today. No history of significant abnormal Pap smears. Options to stop screening per current screening guidelines based on age reviewed. Will readdressed on an annual basis. 6. Health maintenance. No routine lab work done as this is done at Dr. Silvestre Mason office. Follow up 1 year, sooner as needed.   Pamela Auerbach MD, 10:04 AM 01/19/2016

## 2016-01-19 NOTE — Patient Instructions (Signed)
Discussed with Dr. Joylene Draft about scheduling your colonoscopy.  Follow up with Dr. Joylene Draft in reference to your bone health

## 2016-02-11 DIAGNOSIS — Z85820 Personal history of malignant melanoma of skin: Secondary | ICD-10-CM | POA: Diagnosis not present

## 2016-02-11 DIAGNOSIS — D225 Melanocytic nevi of trunk: Secondary | ICD-10-CM | POA: Diagnosis not present

## 2016-02-11 DIAGNOSIS — D1801 Hemangioma of skin and subcutaneous tissue: Secondary | ICD-10-CM | POA: Diagnosis not present

## 2016-02-11 DIAGNOSIS — L738 Other specified follicular disorders: Secondary | ICD-10-CM | POA: Diagnosis not present

## 2016-02-11 DIAGNOSIS — D2272 Melanocytic nevi of left lower limb, including hip: Secondary | ICD-10-CM | POA: Diagnosis not present

## 2016-02-11 DIAGNOSIS — L918 Other hypertrophic disorders of the skin: Secondary | ICD-10-CM | POA: Diagnosis not present

## 2016-02-11 DIAGNOSIS — L821 Other seborrheic keratosis: Secondary | ICD-10-CM | POA: Diagnosis not present

## 2016-03-03 DIAGNOSIS — M25562 Pain in left knee: Secondary | ICD-10-CM | POA: Diagnosis not present

## 2016-03-03 DIAGNOSIS — G8929 Other chronic pain: Secondary | ICD-10-CM | POA: Diagnosis not present

## 2016-03-03 DIAGNOSIS — M1712 Unilateral primary osteoarthritis, left knee: Secondary | ICD-10-CM | POA: Diagnosis not present

## 2016-03-20 DIAGNOSIS — Z23 Encounter for immunization: Secondary | ICD-10-CM | POA: Diagnosis not present

## 2016-03-26 DIAGNOSIS — M1712 Unilateral primary osteoarthritis, left knee: Secondary | ICD-10-CM | POA: Diagnosis not present

## 2016-05-10 DIAGNOSIS — F0390 Unspecified dementia without behavioral disturbance: Secondary | ICD-10-CM

## 2016-05-10 HISTORY — DX: Unspecified dementia, unspecified severity, without behavioral disturbance, psychotic disturbance, mood disturbance, and anxiety: F03.90

## 2016-06-03 ENCOUNTER — Other Ambulatory Visit: Payer: Self-pay | Admitting: Internal Medicine

## 2016-06-03 DIAGNOSIS — Z1231 Encounter for screening mammogram for malignant neoplasm of breast: Secondary | ICD-10-CM

## 2016-06-04 DIAGNOSIS — I10 Essential (primary) hypertension: Secondary | ICD-10-CM | POA: Diagnosis not present

## 2016-06-04 DIAGNOSIS — E559 Vitamin D deficiency, unspecified: Secondary | ICD-10-CM | POA: Diagnosis not present

## 2016-06-04 DIAGNOSIS — M859 Disorder of bone density and structure, unspecified: Secondary | ICD-10-CM | POA: Diagnosis not present

## 2016-06-04 DIAGNOSIS — R7301 Impaired fasting glucose: Secondary | ICD-10-CM | POA: Diagnosis not present

## 2016-06-04 DIAGNOSIS — R8299 Other abnormal findings in urine: Secondary | ICD-10-CM | POA: Diagnosis not present

## 2016-06-04 DIAGNOSIS — Z0000 Encounter for general adult medical examination without abnormal findings: Secondary | ICD-10-CM | POA: Diagnosis not present

## 2016-06-04 DIAGNOSIS — E538 Deficiency of other specified B group vitamins: Secondary | ICD-10-CM | POA: Diagnosis not present

## 2016-06-04 DIAGNOSIS — E785 Hyperlipidemia, unspecified: Secondary | ICD-10-CM | POA: Diagnosis not present

## 2016-06-11 DIAGNOSIS — Z85820 Personal history of malignant melanoma of skin: Secondary | ICD-10-CM | POA: Diagnosis not present

## 2016-06-11 DIAGNOSIS — R7301 Impaired fasting glucose: Secondary | ICD-10-CM | POA: Diagnosis not present

## 2016-06-11 DIAGNOSIS — E559 Vitamin D deficiency, unspecified: Secondary | ICD-10-CM | POA: Diagnosis not present

## 2016-06-11 DIAGNOSIS — E538 Deficiency of other specified B group vitamins: Secondary | ICD-10-CM | POA: Diagnosis not present

## 2016-06-11 DIAGNOSIS — H6122 Impacted cerumen, left ear: Secondary | ICD-10-CM | POA: Diagnosis not present

## 2016-06-11 DIAGNOSIS — I10 Essential (primary) hypertension: Secondary | ICD-10-CM | POA: Diagnosis not present

## 2016-06-11 DIAGNOSIS — Z1389 Encounter for screening for other disorder: Secondary | ICD-10-CM | POA: Diagnosis not present

## 2016-06-11 DIAGNOSIS — Z0000 Encounter for general adult medical examination without abnormal findings: Secondary | ICD-10-CM | POA: Diagnosis not present

## 2016-06-11 DIAGNOSIS — I447 Left bundle-branch block, unspecified: Secondary | ICD-10-CM | POA: Diagnosis not present

## 2016-06-11 DIAGNOSIS — E784 Other hyperlipidemia: Secondary | ICD-10-CM | POA: Diagnosis not present

## 2016-06-11 DIAGNOSIS — Z6824 Body mass index (BMI) 24.0-24.9, adult: Secondary | ICD-10-CM | POA: Diagnosis not present

## 2016-06-11 DIAGNOSIS — M859 Disorder of bone density and structure, unspecified: Secondary | ICD-10-CM | POA: Diagnosis not present

## 2016-06-14 ENCOUNTER — Encounter: Payer: Self-pay | Admitting: Internal Medicine

## 2016-06-16 ENCOUNTER — Ambulatory Visit
Admission: RE | Admit: 2016-06-16 | Discharge: 2016-06-16 | Disposition: A | Discharge status: Home / Self Care | Payer: Medicare Other | Source: Ambulatory Visit | Attending: Internal Medicine | Admitting: Internal Medicine

## 2016-06-16 DIAGNOSIS — Z1231 Encounter for screening mammogram for malignant neoplasm of breast: Secondary | ICD-10-CM | POA: Diagnosis not present

## 2016-08-02 ENCOUNTER — Ambulatory Visit (AMBULATORY_SURGERY_CENTER): Payer: Self-pay | Admitting: *Deleted

## 2016-08-02 ENCOUNTER — Other Ambulatory Visit: Payer: Medicare Other

## 2016-08-02 VITALS — Ht 63.5 in | Wt 139.0 lb

## 2016-08-02 DIAGNOSIS — Z1211 Encounter for screening for malignant neoplasm of colon: Secondary | ICD-10-CM

## 2016-08-02 MED ORDER — NA SULFATE-K SULFATE-MG SULF 17.5-3.13-1.6 GM/177ML PO SOLN: ORAL | 0 refills | Status: DC

## 2016-08-02 NOTE — Progress Notes (Signed)
Patient denies any allergies to eggs or soy. Patient states "hard time waking up with anesthesia".  Patient denies any oxygen use at home and does not take any diet/weight loss medications. EMMI education declined by patient.

## 2016-08-04 DIAGNOSIS — H35372 Puckering of macula, left eye: Secondary | ICD-10-CM | POA: Diagnosis not present

## 2016-08-04 DIAGNOSIS — Z961 Presence of intraocular lens: Secondary | ICD-10-CM | POA: Diagnosis not present

## 2016-08-04 DIAGNOSIS — H268 Other specified cataract: Secondary | ICD-10-CM | POA: Diagnosis not present

## 2016-08-04 DIAGNOSIS — H33323 Round hole, bilateral: Secondary | ICD-10-CM | POA: Diagnosis not present

## 2016-08-16 ENCOUNTER — Encounter: Payer: Self-pay | Admitting: Internal Medicine

## 2016-08-16 ENCOUNTER — Ambulatory Visit (AMBULATORY_SURGERY_CENTER): Payer: Medicare Other | Admitting: Internal Medicine

## 2016-08-16 VITALS — BP 104/62 | HR 62 | Temp 98.0°F | Resp 12 | Ht 63.0 in | Wt 139.0 lb

## 2016-08-16 DIAGNOSIS — Z1211 Encounter for screening for malignant neoplasm of colon: Secondary | ICD-10-CM

## 2016-08-16 DIAGNOSIS — Z1212 Encounter for screening for malignant neoplasm of rectum: Secondary | ICD-10-CM

## 2016-08-16 DIAGNOSIS — F329 Major depressive disorder, single episode, unspecified: Secondary | ICD-10-CM | POA: Diagnosis not present

## 2016-08-16 DIAGNOSIS — I10 Essential (primary) hypertension: Secondary | ICD-10-CM | POA: Diagnosis not present

## 2016-08-16 MED ORDER — SODIUM CHLORIDE 0.9 % IV SOLN: 500.0000 mL | INTRAVENOUS | Status: DC

## 2016-08-16 NOTE — Op Note (Signed)
[place] Patient Name: Pamela Mason Procedure Date: 08/16/2016 10:57 AM MRN: [id number] Endoscopist: Docia Chuck. Henrene Pastor , MD Age: 71 Referring MD:  Date of Birth: 1945/10/12 Gender: Female Account #: [id number] Procedure:                Colonoscopy Indications:              Screening for colorectal malignant neoplasm.                            Negative index examination 2007 Medicines:                Monitored Anesthesia Care Procedure:                Pre-Anesthesia Assessment:                           - Prior to the procedure, a History and Physical                            was performed, and patient medications and                            allergies were reviewed. The patient's tolerance of                            previous anesthesia was also reviewed. The risks                            and benefits of the procedure and the sedation                            options and risks were discussed with the patient.                            All questions were answered, and informed consent                            was obtained. Prior Anticoagulants: The patient has                            taken no previous anticoagulant or antiplatelet                            agents. ASA Grade Assessment: II - A patient with                            mild systemic disease. After reviewing the risks                            and benefits, the patient was deemed in                            satisfactory condition to undergo the procedure.  After obtaining informed consent, the colonoscope                            was passed under direct vision. Throughout the                            procedure, the patient's blood pressure, pulse, and                            oxygen saturations were monitored continuously. The                            Colonoscope was introduced through the anus and                            advanced to the the cecum,  identified by                            appendiceal orifice and ileocecal valve. The                            ileocecal valve, appendiceal orifice, and rectum                            were photographed. The quality of the bowel                            preparation was excellent. The colonoscopy was                            performed without difficulty. The patient tolerated                            the procedure well. The bowel preparation used was                            SUPREP. Scope In: 11:04:39 AM Scope Out: 11:14:09 AM Scope Withdrawal Time: 0 hours 7 minutes 24 seconds  Total Procedure Duration: 0 hours 9 minutes 30 seconds  Findings:                 Multiple diverticula were found in the sigmoid                            colon.                           Internal hemorrhoids were found during retroflexion.                           The exam was otherwise without abnormality on                            direct and retroflexion views. Complications:            No immediate complications. Estimated blood loss:  None. Estimated Blood Loss:     Estimated blood loss: none. Impression:               - Diverticulosis in the sigmoid colon.                           - Internal hemorrhoids.                           - The examination was otherwise normal on direct                            and retroflexion views.                           - No specimens collected. Recommendation:           - Repeat colonoscopy is not recommended for                            screening purposes.                           - Patient has a contact number available for                            emergencies. The signs and symptoms of potential                            delayed complications were discussed with the                            patient. Return to normal activities tomorrow.                            Written discharge instructions were provided to the                             patient.                           - Resume previous diet.                           - Continue present medications. Docia Chuck. Henrene Pastor, MD 08/16/2016 11:17:51 AM This report has been signed electronically.

## 2016-08-16 NOTE — Patient Instructions (Signed)
Handouts given:Diverticulosis, and Hemorrhoids.    YOU HAD AN ENDOSCOPIC PROCEDURE TODAY AT [facility]:   Refer to the procedure report that was given to you for any specific questions about what was found during the examination.  If the procedure report does not answer your questions, please call your gastroenterologist to clarify.  If you requested that your care partner not be given the details of your procedure findings, then the procedure report has been included in a sealed envelope for you to review at your convenience later.  YOU SHOULD EXPECT: Some feelings of bloating in the abdomen. Passage of more gas than usual.  Walking can help get rid of the air that was put into your GI tract during the procedure and reduce the bloating. If you had a lower endoscopy (such as a colonoscopy or flexible sigmoidoscopy) you may notice spotting of blood in your stool or on the toilet paper. If you underwent a bowel prep for your procedure, you may not have a normal bowel movement for a few days.  Please Note:  You might notice some irritation and congestion in your nose or some drainage.  This is from the oxygen used during your procedure.  There is no need for concern and it should clear up in a day or so.  SYMPTOMS TO REPORT IMMEDIATELY:   Following lower endoscopy (colonoscopy or flexible sigmoidoscopy):  Excessive amounts of blood in the stool  Significant tenderness or worsening of abdominal pains  Swelling of the abdomen that is new, acute  Fever of 100F or higher   For urgent or emergent issues, a gastroenterologist can be reached at any hour by calling [phone].   DIET:  We do recommend a small meal at first, but then you may proceed to your regular diet.  Drink plenty of fluids but you should avoid alcoholic beverages for 24 hours.  ACTIVITY:  You should plan to take it easy for the rest of today and you should NOT DRIVE or use heavy machinery until tomorrow  (because of the sedation medicines used during the test).    FOLLOW UP: Our staff will call the number listed on your records the next business day following your procedure to check on you and address any questions or concerns that you may have regarding the information given to you following your procedure. If we do not reach you, we will leave a message.  However, if you are feeling well and you are not experiencing any problems, there is no need to return our call.  We will assume that you have returned to your regular daily activities without incident.  If any biopsies were taken you will be contacted by phone or by letter within the next 1-3 weeks.  Please call us at [phone] if you have not heard about the biopsies in 3 weeks.    SIGNATURES/CONFIDENTIALITY: You and/or your care partner have signed paperwork which will be entered into your electronic medical record.  These signatures attest to the fact that that the information above on your After Visit Summary has been reviewed and is understood.  Full responsibility of the confidentiality of this discharge information lies with you and/or your care-partner.

## 2016-08-16 NOTE — Progress Notes (Signed)
Pt's states no medical or surgical changes since previsit or office visit. 

## 2016-08-16 NOTE — Progress Notes (Signed)
Report to PACU, RN, vss, BBS= Clear.  

## 2016-08-17 ENCOUNTER — Telehealth: Payer: Self-pay | Admitting: *Deleted

## 2016-08-17 NOTE — Telephone Encounter (Signed)
  Follow up Call-  Call back number 08/16/2016  Post procedure Call Back phone  # [phone]  Permission to leave phone message Yes  Some recent data might be hidden     Patient questions:  Do you have a fever, pain , or abdominal swelling? No. Pain Score  0 *  Have you tolerated food without any problems? Yes.    Have you been able to return to your normal activities? Yes.    Do you have any questions about your discharge instructions: Diet   No. Medications  No. Follow up visit  No.  Do you have questions or concerns about your Care? No.  Actions: * If pain score is 4 or above: No action needed, pain <4.

## 2016-10-06 DIAGNOSIS — Z6823 Body mass index (BMI) 23.0-23.9, adult: Secondary | ICD-10-CM | POA: Diagnosis not present

## 2016-10-06 DIAGNOSIS — F3289 Other specified depressive episodes: Secondary | ICD-10-CM | POA: Diagnosis not present

## 2016-10-06 DIAGNOSIS — R413 Other amnesia: Secondary | ICD-10-CM | POA: Diagnosis not present

## 2016-10-07 ENCOUNTER — Other Ambulatory Visit: Payer: Self-pay | Admitting: Internal Medicine

## 2016-10-07 DIAGNOSIS — R413 Other amnesia: Secondary | ICD-10-CM

## 2016-11-01 ENCOUNTER — Ambulatory Visit
Admission: RE | Admit: 2016-11-01 | Discharge: 2016-11-01 | Disposition: A | Discharge status: Home / Self Care | Payer: Medicare Other | Source: Ambulatory Visit | Attending: Internal Medicine | Admitting: Internal Medicine

## 2016-11-01 DIAGNOSIS — R413 Other amnesia: Secondary | ICD-10-CM

## 2016-11-05 ENCOUNTER — Encounter: Payer: Self-pay | Admitting: Psychology

## 2016-12-10 ENCOUNTER — Encounter: Payer: Medicare Other | Attending: Psychology | Admitting: Psychology

## 2016-12-10 DIAGNOSIS — F329 Major depressive disorder, single episode, unspecified: Secondary | ICD-10-CM | POA: Insufficient documentation

## 2016-12-10 DIAGNOSIS — F0789 Other personality and behavioral disorders due to known physiological condition: Secondary | ICD-10-CM

## 2016-12-10 DIAGNOSIS — F09 Unspecified mental disorder due to known physiological condition: Secondary | ICD-10-CM

## 2016-12-10 DIAGNOSIS — F321 Major depressive disorder, single episode, moderate: Secondary | ICD-10-CM

## 2016-12-10 DIAGNOSIS — R413 Other amnesia: Secondary | ICD-10-CM | POA: Diagnosis not present

## 2016-12-23 DIAGNOSIS — F331 Major depressive disorder, recurrent, moderate: Secondary | ICD-10-CM | POA: Diagnosis not present

## 2016-12-24 DIAGNOSIS — F3289 Other specified depressive episodes: Secondary | ICD-10-CM | POA: Diagnosis not present

## 2016-12-24 DIAGNOSIS — R413 Other amnesia: Secondary | ICD-10-CM | POA: Diagnosis not present

## 2016-12-24 DIAGNOSIS — R7301 Impaired fasting glucose: Secondary | ICD-10-CM | POA: Diagnosis not present

## 2016-12-24 DIAGNOSIS — Z6823 Body mass index (BMI) 23.0-23.9, adult: Secondary | ICD-10-CM | POA: Diagnosis not present

## 2016-12-24 DIAGNOSIS — I10 Essential (primary) hypertension: Secondary | ICD-10-CM | POA: Diagnosis not present

## 2016-12-26 ENCOUNTER — Encounter: Payer: Self-pay | Admitting: Psychology

## 2016-12-26 NOTE — Progress Notes (Signed)
Neuropsychological Consultation   Patient:   Pamela Mason   DOB:   Apr 27, 1946  MR Number:  [id number]  Location:  [facility] [address] [place] [id number] [place] [address] Dept: [phone]           Date of Service:   12/10/2016  Start Time:   9 AM End Time:   10 AM  Provider/Observer:  Ilean Skill, Psy.D.       Clinical Neuropsychologist       Billing Code/Service: [phone] neurobehavioral status exam  Chief Complaint:    Pamela Mason is a 71 year old female referred by Dr. Crist Infante for neuropsychological evaluation due to concerns about possible early symptoms of cognitive dementia. The patient is describing is having increasing short-term memory issues. She reports that they're not significant this time but are causing some significant problems. The patient reports that she is not remembering everything that she should. The patient reports that she will read something have to go back and repeated again.  The patient also describes some word finding difficulties. She describes her memory issues as episodic in nature and that sometimes she seems to be doing okay. She denies any changes in motor functioning and denies any tremors. She also denies any geographic disorientation and reports that she is still driving without any problems. The patient has begun to worry more and feeling symptoms of depression. She is worried about the unknown and scared about what would happen if she does develop more memory issues.  Reason for Service:  Pamela Mason is a 71 year old female who was referred by her internal medicine physician due to concerns about possible early symptoms of dementia.The patient describes short-term memory deficits and learning new information deficits. She denies any geographic disorientation and is still driving without incident. The patient has been  worrying more and feeling some depressive symptoms due to her confusion and memory issues.  Current Status:  The patient describes mild symptoms of depression, confusion and memory problems.  Reliability of Information: Information is derived from 1 hour face-to-face clinical interview with the patient as well as review of available medical records.  Behavioral Observation: Pamela Mason  presents as a 71 y.o.-year-old Right handed Female who appeared her stated age. her dress was Appropriate and she was Well Groomed and her manners were Appropriate to the situation.  her participation was indicative of Appropriate and Attentive behaviors.  There were not any physical disabilities noted.  she displayed an appropriate level of cooperation and motivation.     Interactions:    Active Appropriate and Attentive  Attention:   within normal limits and attention span appeared shorter than expected for age  Memory:   abnormal; remote memory intact, recent memory impaired  Visuo-spatial:  within normal limits  Speech (Volume):  normal  Speech:   normal; some word finding issues noted.  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  Substance Use:  No concerns of substance abuse are reported.    Education:   Engineering geologist History:   Past Medical History:  Diagnosis Date  . Depression   . Hyperlipidemia   . Hypertension   . Osteopenia        Abuse/Trauma History: The patient denies any history of traumatic  or abusive experiences.  Psychiatric History:  The patient has been treated recently for issues of depression with SSRI medications (Prozac) as well as  Wellbutrin.  medical recordsindicate that she may have n having some issues with Wellbutrin.  Family Med/Psych History:  Family History  Problem Relation Age of Onset  . Hypertension Mother   . Ovarian cancer Mother   . Heart disease Father   . Colon cancer Neg Hx     Risk of Suicide/Violence: virtually non-existent the patient denies any suicidal or homicidal ideation.  Impression/DX:  Pamela Mason is a 71 year old female referred by Dr. Crist Infante for neuropsychological evaluation due to concerns about possible early symptoms of cognitive dementia. The patient is describing is having increasing short-term memory issues. She reports that they're not significant this time but are causing some significant problems. The patient reports that she is not remembering everything that she should. The patient reports that she will read something have to go back and repeated again.  The patient also describes some word finding difficulties. She describes her memory issues as episodic in nature and that sometimes she seems to be doing okay. She denies any changes in motor functioning and denies any tremors. She also denies any geographic disorientation and reports that she is still driving without any problems. The patient has begun to worry more and feeling symptoms of depression. She is worried about the unknown and scared about what would happen if she does develop more memory issues.  At this point, there do appear to be fairly mild symptoms that the patient is increasingly becoming anxious about. Anxiety and depression may be the primary culprits but this may be some early symptoms of difficulties. The patient denies any geographic disorientation or significant changes in expressive language but does note some word finding  difficulties.  Disposition/Plan:  We will set the patient up for neuropsychological testing initially utilizing the RBANS assessment tools.  Diagnosis:    Cognitive and neurobehavioral dysfunction  Current moderate episode of major depressive disorder without prior episode ([place])         Electronically Signed   _______________________ Ilean Skill, Psy.D.

## 2017-01-13 ENCOUNTER — Encounter: Payer: Self-pay | Admitting: Psychology

## 2017-01-13 ENCOUNTER — Encounter: Payer: Medicare Other | Attending: Psychology | Admitting: Psychology

## 2017-01-13 DIAGNOSIS — F0789 Other personality and behavioral disorders due to known physiological condition: Secondary | ICD-10-CM | POA: Diagnosis not present

## 2017-01-13 DIAGNOSIS — R413 Other amnesia: Secondary | ICD-10-CM | POA: Insufficient documentation

## 2017-01-13 DIAGNOSIS — F419 Anxiety disorder, unspecified: Secondary | ICD-10-CM | POA: Diagnosis not present

## 2017-01-13 DIAGNOSIS — F329 Major depressive disorder, single episode, unspecified: Secondary | ICD-10-CM | POA: Insufficient documentation

## 2017-01-13 DIAGNOSIS — G3184 Mild cognitive impairment, so stated: Secondary | ICD-10-CM

## 2017-01-13 DIAGNOSIS — F09 Unspecified mental disorder due to known physiological condition: Secondary | ICD-10-CM

## 2017-01-13 NOTE — Progress Notes (Signed)
Neuropsychological Consultation  Patient:  Pamela Mason   DOB: 1945-06-10  MR Number: [id number]  Location: [facility] [address], [place] [id number] [place] [address] Dept: [phone]  Start: 9 AM End: 11 AM  Provider/Observer:     Edgardo Roys PSYD  Chief Complaint:      Chief Complaint  Patient presents with  . Memory Loss  . Anxiety    Reason For Service:     Pamela Mason is a 71 year old female who was referred by her internal medicine physician due to concerns about possible early symptoms of dementia.The patient describes short-term memory deficits and learning new information deficits. She denies any geographic disorientation and is still driving without incident. The patient has been worrying more and feeling some depressive symptoms due to her confusion and memory issues.  Testing Administered:  RBANS A  Participation Level:   Active  Participation Quality:  Appropriate and Attentive      Behavioral Observation:  Well Groomed, Alert, and Appropriate.   Test Results:        RBANS Update Form A Total Scale 86  From a global perspective, the patient produced a composite score of INDICES within the battery of 86. This global composite represents a performance that falls in the low average range and is somewhat below what would be predicted based on her education and life experiences. The score would suggest some areas of impairment overall. The patient did show some significant variability in subtest performance and therefore this global indexes likely not a particularly great indices regarding the severity or efficient performance she displayed on various subtests.     RBANS Update Form A Immediate Memory 65  The patient produced an immediate memory index score that falls in the extremely low range of functioning. The patient had  significant difficulty initial learning both complex and simple verbal information. This level of memory difficulties and learning new information would suggest some difficulties managing basic day-to-day issues such as medication management, cooking, or appointment schedules.        RBANS Update Form A Visuospatial/ Constructional 121  The patient's performance on basic visual spatial perceptual and visual constructional tasks falls in the superior range of functioning. The patient did exceptionally well on measures requiring her to process visual information and showed no indications of visual neglect or visual inattention. This would suggest that she does maintain the visual processing and visual attentional abilities for driving with regard to this area of neuropsychological functioning.        RBANS Update Form A Attention 109  The patient produced and attentional index score of 109 which falls at the upper end of the average to high average range of functioning. The patient clearly was able to process simple auditory information as well as maintain good information processing speed and good visual scanning and visual search and abilities.         RBANS Update Form A Language 88  The patient produced a language index score of 88 which falls in the low average range of functioning.  The patient did fairly well with regard to targeted naming abilities but had greater difficulty with semantic fluency and verbal fluency challenges.     RBANS Update Form A Delayed Memory 71  The patient's performance with regard to delayed memory index which measures delayed recall and recognition of both verbal and visual information falls in the borderline range  of functioning. This pattern indicates that the patient is not only having difficulty with free recall and targeted recall of information that is also having difficulty storing and maintaining that information of her period of delay. However, this  pattern is most indicative of an reflex above the significant difficulties even with immediate memory. The degree with which she was unable to learn new information initially also lowered the baseline which with she could recall after period of delay.  Summary of Results:   Results of this current comprehensive objective neuropsychological battery assessing a broad range of neuropsychological areas strongly suggests significant memory difficulties in sharp contrast to her excellent encoding abilities and attentional abilities. The patient also showed excellent visual spatial abilities as well. The both the patient's immediate memory as well as delayed memory were significantly impaired and the patient showed significant impairments for both free recall as well as cued recall of learned information. The patient also had some mild but significant difficulties with verbal fluency and semantic fluency while her naming ability was within normal limits.  Impression/Diagnosis:   Results of the current neuropsychological evaluation are consistent with the patient's descriptions of significant and observable difficulties with learning new information and recalling that information after period of delay. The patient had reported that she has been doing very well with geographic orientation and her extremely good scores on visual spatial and visual constructional abilities would suggest that this is indeed true. The contrast between the significant memory deficits and mild deficits with regard to expressive language functioning compared to superior functioning for visual-spatial visual constructional abilities as well as attentional abilities, show patterns which are not specifically consistent with those typically found with cortical dementia such as Alzheimer's or Lewy body. The patient does have some potential risk factors for cerebrovascular issues that could explain some of the difficulties that she is having related  to hyperlipidemia and hypertension. However, she has been well managed for these conditions. At this point, the patient does clearly have some significant memory deficits that are related to simply free recall of information that she also showed difficulties with storage and fundamental memory processes. Therefore, the memory and language weaknesses that were identified cannot be simply explained by anxiety or other mood disorder types of symptoms such as anxiety or depression.  These memory deficits were identified were more related to verbal memory deficits and visual memory was more efficient.    Recommendations:   Given the pattern of neuropsychological strengths and weaknesses and the extreme variability of subtest performance it will be important to retest the patient and her proximally 6-9 months. We will need to assess the degree of change as a function of time to determine if there is any progressive deterioration in any of the specific neuropsychological areas. While we can't rule out the possibility of early stage Alzheimer's the patient's pattern of neuropsychological strengths and weaknesses are not consistent with the most typical pattern seen with Alzheimer's disease as the patient is showing significant memory weaknesses and deficits but not showing any visual-spatial or geographic disorientation types of patterns. The patient is driven in travel to a number of cities that she does not typically spend time in and had no issues or difficulties. Therefore, the patient does meet the diagnostic criterion for objective memory deficits but we will need repeat testing to do within subject comparisons for more accurate definitive diagnostic considerations.  Diagnosis:    Axis I: Memory impairment of gradual onset  Mild cognitive impairment with memory loss  Cognitive and neurobehavioral dysfunction  Anxiety   Ilean Skill, Psy.D. Neuropsychologist

## 2017-01-18 ENCOUNTER — Encounter (HOSPITAL_BASED_OUTPATIENT_CLINIC_OR_DEPARTMENT_OTHER): Payer: Medicare Other | Admitting: Psychology

## 2017-01-18 DIAGNOSIS — F0789 Other personality and behavioral disorders due to known physiological condition: Secondary | ICD-10-CM | POA: Diagnosis not present

## 2017-01-18 DIAGNOSIS — R413 Other amnesia: Secondary | ICD-10-CM | POA: Diagnosis not present

## 2017-01-18 DIAGNOSIS — F321 Major depressive disorder, single episode, moderate: Secondary | ICD-10-CM | POA: Diagnosis not present

## 2017-01-18 DIAGNOSIS — F329 Major depressive disorder, single episode, unspecified: Secondary | ICD-10-CM | POA: Diagnosis not present

## 2017-01-18 DIAGNOSIS — F09 Unspecified mental disorder due to known physiological condition: Secondary | ICD-10-CM | POA: Diagnosis not present

## 2017-01-18 DIAGNOSIS — F419 Anxiety disorder, unspecified: Secondary | ICD-10-CM

## 2017-01-18 DIAGNOSIS — G3184 Mild cognitive impairment, so stated: Secondary | ICD-10-CM

## 2017-01-19 ENCOUNTER — Encounter: Payer: Self-pay | Admitting: Gynecology

## 2017-01-19 ENCOUNTER — Ambulatory Visit (INDEPENDENT_AMBULATORY_CARE_PROVIDER_SITE_OTHER): Payer: Medicare Other | Admitting: Gynecology

## 2017-01-19 VITALS — BP 110/78 | Ht 63.0 in | Wt 134.0 lb

## 2017-01-19 DIAGNOSIS — Z01411 Encounter for gynecological examination (general) (routine) with abnormal findings: Secondary | ICD-10-CM

## 2017-01-19 DIAGNOSIS — N952 Postmenopausal atrophic vaginitis: Secondary | ICD-10-CM

## 2017-01-19 DIAGNOSIS — M8580 Other specified disorders of bone density and structure, unspecified site: Secondary | ICD-10-CM

## 2017-01-19 NOTE — Patient Instructions (Signed)
Followup in one year for annual exam, sooner if any issues 

## 2017-01-19 NOTE — Progress Notes (Signed)
    Shira Bobst [facility] [birth year] [id number]        71 y.o.  [id number] for breast and pelvic exam. Without GYN complaints.  Past medical history,surgical history, problem list, medications, allergies, family history and social history were all reviewed and documented as reviewed in the EPIC chart.  ROS:  Performed with pertinent positives and negatives included in the history, assessment and plan.   Additional significant findings :  None   Exam: Wandra Scot assistant Vitals:   01/19/17 0857  BP: 110/78  Weight: 134 lb (60.8 kg)  Height: 5\' 3"  (1.6 m)   Body mass index is 23.74 kg/m.  General appearance:  Normal affect, orientation and appearance. Skin: Grossly normal HEENT: Without gross lesions.  No cervical or supraclavicular adenopathy. Thyroid normal.  Lungs:  Clear without wheezing, rales or rhonchi Cardiac: RR, without RMG Abdominal:  Soft, nontender, without masses, guarding, rebound, organomegaly or hernia Breasts:  Examined lying and sitting without masses, retractions, discharge or axillary adenopathy. Pelvic:  Ext, BUS, Vagina: With atrophic changes  Cervix: With atrophic changes  Uterus: Anteverted, normal size, shape and contour, midline and mobile nontender   Adnexa: Without masses or tenderness    Anus and perineum: Normal   Rectovaginal: Normal sphincter tone without palpated masses or tenderness.    Assessment/Plan:  71 y.o. [id number] female for breast and pelvic exam .   1. Postmenopausal/atrophic genital changes. No significant hot flushes, night sweats, vaginal dryness or any bleeding. Continue to monitor and report any issues or bleeding. 2. Osteopenia. Followed by Dr. Joylene Draft. Previously on Fosamax but now off. She'll continue to follow up with him in reference to this. 3. Colonoscopy 2018. Repeat at their recommended interval. 4. Pap smear 2015. No Pap smear done today. No history of abnormal Pap smears previously. Options to stop screening per current  screening guidelines reviewed and the patient is comfortable with the stop screening recommendation. 5. Mammography 06/2016. Continue with annual mammography when due. SBE monthly reviewed. 6. Health maintenance. No routine lab work done as patient does this elsewhere. Follow up 1 year, sooner as needed.   Anastasio Auerbach MD, 9:14 AM 01/19/2017

## 2017-01-26 DIAGNOSIS — F331 Major depressive disorder, recurrent, moderate: Secondary | ICD-10-CM | POA: Diagnosis not present

## 2017-01-30 NOTE — Progress Notes (Signed)
Provided feedback regarding the results of the current evaluation.  Below are the results of that evaluation.  Summary of Results:                        Results of this current comprehensive objective neuropsychological battery assessing a broad range of neuropsychological areas strongly suggests significant memory difficulties in sharp contrast to her excellent encoding abilities and attentional abilities. The patient also showed excellent visual spatial abilities as well. The both the patient's immediate memory as well as delayed memory were significantly impaired and the patient showed significant impairments for both free recall as well as cued recall of learned information. The patient also had some mild but significant difficulties with verbal fluency and semantic fluency while her naming ability was within normal limits.  Impression/Diagnosis:                     Results of the current neuropsychological evaluation are consistent with the patient's descriptions of significant and observable difficulties with learning new information and recalling that information after period of delay. The patient had reported that she has been doing very well with geographic orientation and her extremely good scores on visual spatial and visual constructional abilities would suggest that this is indeed true. The contrast between the significant memory deficits and mild deficits with regard to expressive language functioning compared to superior functioning for visual-spatial visual constructional abilities as well as attentional abilities, show patterns which are not specifically consistent with those typically found with cortical dementia such as Alzheimer's or Lewy body. The patient does have some potential risk factors for cerebrovascular issues that could explain some of the difficulties that she is having related to hyperlipidemia and hypertension. However, she has been well managed for these conditions. At this  point, the patient does clearly have some significant memory deficits that are related to simply free recall of information that she also showed difficulties with storage and fundamental memory processes. Therefore, the memory and language weaknesses that were identified cannot be simply explained by anxiety or other mood disorder types of symptoms such as anxiety or depression.  These memory deficits were identified were more related to verbal memory deficits and visual memory was more efficient.    Recommendations:                          Given the pattern of neuropsychological strengths and weaknesses and the extreme variability of subtest performance it will be important to retest the patient and her proximally 6-9 months. We will need to assess the degree of change as a function of time to determine if there is any progressive deterioration in any of the specific neuropsychological areas. While we can't rule out the possibility of early stage Alzheimer's the patient's pattern of neuropsychological strengths and weaknesses are not consistent with the most typical pattern seen with Alzheimer's disease as the patient is showing significant memory weaknesses and deficits but not showing any visual-spatial or geographic disorientation types of patterns. The patient is driven in travel to a number of cities that she does not typically spend time in and had no issues or difficulties. Therefore, the patient does meet the diagnostic criterion for objective memory deficits but we will need repeat testing to do within subject comparisons for more accurate definitive diagnostic considerations.  Diagnosis:  Axis I: Memory impairment of gradual onset  Mild cognitive impairment with memory loss  Cognitive and neurobehavioral dysfunction  Anxiety

## 2017-03-05 DIAGNOSIS — Z23 Encounter for immunization: Secondary | ICD-10-CM | POA: Diagnosis not present

## 2017-03-15 DIAGNOSIS — M1712 Unilateral primary osteoarthritis, left knee: Secondary | ICD-10-CM | POA: Diagnosis not present

## 2017-03-15 DIAGNOSIS — M170 Bilateral primary osteoarthritis of knee: Secondary | ICD-10-CM | POA: Diagnosis not present

## 2017-03-15 DIAGNOSIS — M1711 Unilateral primary osteoarthritis, right knee: Secondary | ICD-10-CM | POA: Diagnosis not present

## 2017-03-16 DIAGNOSIS — F331 Major depressive disorder, recurrent, moderate: Secondary | ICD-10-CM | POA: Diagnosis not present

## 2017-03-24 DIAGNOSIS — H9193 Unspecified hearing loss, bilateral: Secondary | ICD-10-CM | POA: Diagnosis not present

## 2017-03-24 DIAGNOSIS — Z6823 Body mass index (BMI) 23.0-23.9, adult: Secondary | ICD-10-CM | POA: Diagnosis not present

## 2017-03-24 DIAGNOSIS — H6123 Impacted cerumen, bilateral: Secondary | ICD-10-CM | POA: Diagnosis not present

## 2017-04-12 DIAGNOSIS — Z85820 Personal history of malignant melanoma of skin: Secondary | ICD-10-CM | POA: Diagnosis not present

## 2017-04-12 DIAGNOSIS — D692 Other nonthrombocytopenic purpura: Secondary | ICD-10-CM | POA: Diagnosis not present

## 2017-04-12 DIAGNOSIS — D225 Melanocytic nevi of trunk: Secondary | ICD-10-CM | POA: Diagnosis not present

## 2017-04-12 DIAGNOSIS — M859 Disorder of bone density and structure, unspecified: Secondary | ICD-10-CM | POA: Diagnosis not present

## 2017-04-12 DIAGNOSIS — B078 Other viral warts: Secondary | ICD-10-CM | POA: Diagnosis not present

## 2017-04-12 DIAGNOSIS — D1801 Hemangioma of skin and subcutaneous tissue: Secondary | ICD-10-CM | POA: Diagnosis not present

## 2017-04-12 DIAGNOSIS — L738 Other specified follicular disorders: Secondary | ICD-10-CM | POA: Diagnosis not present

## 2017-05-11 DIAGNOSIS — F331 Major depressive disorder, recurrent, moderate: Secondary | ICD-10-CM | POA: Diagnosis not present

## 2017-05-17 ENCOUNTER — Other Ambulatory Visit: Payer: Self-pay | Admitting: Internal Medicine

## 2017-05-17 DIAGNOSIS — Z1231 Encounter for screening mammogram for malignant neoplasm of breast: Secondary | ICD-10-CM

## 2017-06-17 ENCOUNTER — Ambulatory Visit
Admission: RE | Admit: 2017-06-17 | Discharge: 2017-06-17 | Disposition: A | Discharge status: Home / Self Care | Payer: Medicare Other | Source: Ambulatory Visit | Attending: Internal Medicine | Admitting: Internal Medicine

## 2017-06-17 DIAGNOSIS — Z1231 Encounter for screening mammogram for malignant neoplasm of breast: Secondary | ICD-10-CM

## 2017-07-07 DIAGNOSIS — F331 Major depressive disorder, recurrent, moderate: Secondary | ICD-10-CM | POA: Diagnosis not present

## 2017-07-25 DIAGNOSIS — I10 Essential (primary) hypertension: Secondary | ICD-10-CM | POA: Diagnosis not present

## 2017-07-25 DIAGNOSIS — E7849 Other hyperlipidemia: Secondary | ICD-10-CM | POA: Diagnosis not present

## 2017-07-25 DIAGNOSIS — R7301 Impaired fasting glucose: Secondary | ICD-10-CM | POA: Diagnosis not present

## 2017-07-25 DIAGNOSIS — E559 Vitamin D deficiency, unspecified: Secondary | ICD-10-CM | POA: Diagnosis not present

## 2017-07-25 DIAGNOSIS — E538 Deficiency of other specified B group vitamins: Secondary | ICD-10-CM | POA: Diagnosis not present

## 2017-07-25 DIAGNOSIS — R82998 Other abnormal findings in urine: Secondary | ICD-10-CM | POA: Diagnosis not present

## 2017-08-01 DIAGNOSIS — Z1389 Encounter for screening for other disorder: Secondary | ICD-10-CM | POA: Diagnosis not present

## 2017-08-01 DIAGNOSIS — M859 Disorder of bone density and structure, unspecified: Secondary | ICD-10-CM | POA: Diagnosis not present

## 2017-08-01 DIAGNOSIS — E538 Deficiency of other specified B group vitamins: Secondary | ICD-10-CM | POA: Diagnosis not present

## 2017-08-01 DIAGNOSIS — I10 Essential (primary) hypertension: Secondary | ICD-10-CM | POA: Diagnosis not present

## 2017-08-01 DIAGNOSIS — F329 Major depressive disorder, single episode, unspecified: Secondary | ICD-10-CM | POA: Diagnosis not present

## 2017-08-01 DIAGNOSIS — Z6825 Body mass index (BMI) 25.0-25.9, adult: Secondary | ICD-10-CM | POA: Diagnosis not present

## 2017-08-01 DIAGNOSIS — I447 Left bundle-branch block, unspecified: Secondary | ICD-10-CM | POA: Diagnosis not present

## 2017-08-01 DIAGNOSIS — Z85820 Personal history of malignant melanoma of skin: Secondary | ICD-10-CM | POA: Diagnosis not present

## 2017-08-01 DIAGNOSIS — R7301 Impaired fasting glucose: Secondary | ICD-10-CM | POA: Diagnosis not present

## 2017-08-01 DIAGNOSIS — Z0000 Encounter for general adult medical examination without abnormal findings: Secondary | ICD-10-CM | POA: Diagnosis not present

## 2017-08-01 DIAGNOSIS — E785 Hyperlipidemia, unspecified: Secondary | ICD-10-CM | POA: Diagnosis not present

## 2017-08-01 DIAGNOSIS — R413 Other amnesia: Secondary | ICD-10-CM | POA: Diagnosis not present

## 2017-08-05 DIAGNOSIS — Z1212 Encounter for screening for malignant neoplasm of rectum: Secondary | ICD-10-CM | POA: Diagnosis not present

## 2017-08-22 DIAGNOSIS — Z23 Encounter for immunization: Secondary | ICD-10-CM | POA: Diagnosis not present

## 2017-09-07 DIAGNOSIS — Z961 Presence of intraocular lens: Secondary | ICD-10-CM | POA: Diagnosis not present

## 2017-09-07 DIAGNOSIS — F331 Major depressive disorder, recurrent, moderate: Secondary | ICD-10-CM | POA: Diagnosis not present

## 2017-09-07 DIAGNOSIS — H268 Other specified cataract: Secondary | ICD-10-CM | POA: Diagnosis not present

## 2017-09-14 DIAGNOSIS — M1712 Unilateral primary osteoarthritis, left knee: Secondary | ICD-10-CM | POA: Diagnosis not present

## 2017-10-12 DIAGNOSIS — F331 Major depressive disorder, recurrent, moderate: Secondary | ICD-10-CM | POA: Diagnosis not present

## 2017-10-18 ENCOUNTER — Ambulatory Visit: Payer: Medicare Other | Admitting: Psychology

## 2017-10-19 ENCOUNTER — Encounter: Payer: Self-pay | Admitting: Neurology

## 2017-10-19 ENCOUNTER — Ambulatory Visit (INDEPENDENT_AMBULATORY_CARE_PROVIDER_SITE_OTHER): Payer: Medicare Other | Admitting: Neurology

## 2017-10-19 VITALS — BP 133/86 | HR 80 | Ht 64.0 in | Wt 144.0 lb

## 2017-10-19 DIAGNOSIS — G3184 Mild cognitive impairment, so stated: Secondary | ICD-10-CM | POA: Diagnosis not present

## 2017-10-19 NOTE — Patient Instructions (Signed)
You have complaints of memory loss: memory loss or changes in cognitive function can have many reasons and does not always mean you have dementia. Conditions that can contribute to subjective or objective memory loss include: depression, stress, poor sleep from insomnia or sleep apnea, dehydration, fluctuation in blood sugar values, thyroid or electrolyte dysfunction and certain vitamin deficiencies. Dementia can be caused by stroke, brain atherosclerosis or brain vascular disease due to vascular risk factors (smoking, high blood pressure, high cholesterol, obesity and uncontrolled diabetes), certain degenerative brain disorders (including Parkinson's disease and Multiple sclerosis) and by Alzheimer's disease or other, more rare and sometimes hereditary causes.   We will monitor your symptoms and exam. You have had regular blood work through Dr. Joylene Draft.   You had a recent brain scan.   You can continue with the Galantamine long-acting at 8 mg.   Your memory loss is rather mild at this point, which, of course is reassuring. Also, you do not have a family history of dementia, which is good of course.   Please ask family and friends, if you snore and if so, how loud it is, and if you have breathing related issues in your sleep, such as: snorting sounds, choking sounds, pauses in your breathing or shallow breathing events. These may be symptoms of obstructive sleep apnea (OSA). We may consider sleep testing if needed.   You should keep your follow up with Dr. Jefm Miles.

## 2017-10-19 NOTE — Progress Notes (Signed)
Subjective:    Patient ID: Pamela Mason is a 72 y.o. female.  HPI     Star Age, MD, PhD [facility] [address] [place], [place] [address]  Dear Dr. Joylene Draft,   I saw your patient, Pamela Mason, upon your kind request, in my neurologic clinic today for initial consultation of her memory loss and recurrent headaches. The patient is unaccompanied today. As you Pamela Mason is a 72 year old right-handed woman with an underlying medical history of hypertension, osteopenia, hyperlipidemia, depression, and anxiety, who reports short-term memory problems for the past year or so, noted primarily by her children as she was forgetful and asks the same questions over again. I reviewed your office note from 08/01/17, which you kindly included. She had neuropsychological evaluation with Dr. Jefm Miles in September 2018 which indicated mild cognitive impairment and depression/anxiety, mild early dementia not excluded. Retesting was recommended in 6-9 months. She has been on galantamine 8 mg strength. She is on oral vitamin B12 supplement. For her depression she is on Prozac which was increased last year. She is widowed and lives alone. She is retired. She finished college. She has 3 children, smoked briefly in the 36s but quit in 67. She drinks alcohol in the form of wine, 1-2 glasses every other day or so. She does not have a history of alcohol dependence. She drinks caffeine in the form of coffee and soda. She had a brain MRI without contrast on 11/01/2016 and I reviewed the results: IMPRESSION: 1. No acute intracranial abnormality or mass. 2. Mild-to-moderate cerebral atrophy.  She tries to hydrate well with water. She sleeps well. She tries to be in bed around 10 and rise time is between 6 and 7. She's not sure if she snores. Her husband died at age 59 about 55 years ago. She does not have a BF or significant other. Her son, Pamela Mason is the oldest and lives in  [place]. She is comfortable driving to [place] and [place], [place] where her youngest daughter Pamela Mason lives. Her middle daughter Pamela Mason lives in [place] and she flies to [place], she recently returned from a visit. She has 7 grandchildren. She has not noticed any difficulty driving and uses a GPS successfully. She drove herself to this appointment. She has a follow-up appointment with Dr. Jefm Miles in July. She sees her psychiatrist on a regular basis, Dr. Aline August out of [place]. She was changed to Pristiq, and is now in the process of tapering off of Pristiq and starting sertraline titration. She has no family history of dementia, but she is not aware of her father's side of the history, her parents were divorced and she did not know her father after that. Her mother lived to be 51 years old and died from cancer. She has 1 sister, Pamela Mason who is 3 years younger and one brother, Pamela Mason who is 4 years younger, neither sibling with memory loss as far she knows. Her weight has remained stable. 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 She has been on galantamine since last year but does not know how long it has been. She tries to exercise in the form of walking in her neighborhood.  Her Past Medical History Is Significant For: Past Medical History:  Diagnosis Date  . Depression   . Hyperlipidemia   . Hypertension   . Osteopenia     Her Past Surgical History Is Significant For: Past Surgical History:  Procedure Laterality Date  . INTRACAPSULAR CATARACT EXTRACTION    .  ROTATOR CUFF REPAIR    . TUBAL LIGATION      Her Family History Is Significant For: Family History  Problem Relation Age of Onset  . Hypertension Mother   . Ovarian cancer Mother   . Heart disease Father   . Colon cancer Neg Hx     Her Social History Is Significant For: Social History   Socioeconomic History  . Marital status: Widowed    Spouse name: Not on file  . Number of children: Not on file   . Years of education: Not on file  . Highest education level: Not on file  Occupational History  . Not on file  Social Needs  . Financial resource strain: Not on file  . Food insecurity:    Worry: Not on file    Inability: Not on file  . Transportation needs:    Medical: Not on file    Non-medical: Not on file  Tobacco Use  . Smoking status: Former Research scientist (life sciences)  . Smokeless tobacco: Never Used  Substance and Sexual Activity  . Alcohol use: Yes    Alcohol/week: 7.2 oz    Types: 5 Standard drinks or equivalent, 7 Glasses of wine per week    Comment: wine daily  . Drug use: No  . Sexual activity: Never    Birth control/protection: Post-menopausal    Comment: 1st intercourse 54 yrs-Fewer than 5 partners  Lifestyle  . Physical activity:    Days per week: Not on file    Minutes per session: Not on file  . Stress: Not on file  Relationships  . Social connections:    Talks on phone: Not on file    Gets together: Not on file    Attends religious service: Not on file    Active member of club or organization: Not on file    Attends meetings of clubs or organizations: Not on file    Relationship status: Not on file  Other Topics Concern  . Not on file  Social History Narrative  . Not on file    Her Allergies Are:  Allergies  Allergen Reactions  . Tape Other (See Comments)    redness  :   Her Current Medications Are:  Outpatient Encounter Medications as of 10/19/2017  Medication Sig  . Cyanocobalamin (RA VITAMIN B-12 TR) 1000 MCG TBCR Take 1 tablet by mouth daily.  Marland Kitchen Desvenlafaxine Succinate (PRISTIQ PO) Take by mouth.  . ezetimibe (ZETIA) 10 MG tablet Take 10 mg by mouth daily.  Marland Kitchen galantamine (RAZADYNE ER) 8 MG 24 hr capsule Take 8 mg by mouth daily with breakfast.  . lisinopril (PRINIVIL,ZESTRIL) 10 MG tablet Take 1 tablet by mouth daily.  . Pediatric Multivitamins-Iron (FLINTSTONES PLUS IRON PO) Take by mouth.  . sertraline (ZOLOFT) 50 MG tablet Take 25 mg by mouth daily.   . simvastatin (ZOCOR) 40 MG tablet Take 40 mg by mouth at bedtime.    . Vitamin D, Ergocalciferol, (DRISDOL) 50000 units CAPS capsule Take 1 capsule by mouth once a week.  . [DISCONTINUED] aspirin 81 MG tablet Take 81 mg by mouth daily.    . [DISCONTINUED] buPROPion (WELLBUTRIN XL) 150 MG 24 hr tablet Take 150 mg by mouth daily.  . [DISCONTINUED] FLUoxetine (PROZAC) 40 MG capsule Take 40 mg by mouth daily.   Facility-Administered Encounter Medications as of 10/19/2017  Medication  . 0.9 %  sodium chloride infusion  :   Review of Systems:  Out of a complete 14 point review of systems, all  are reviewed and negative with the exception of these symptoms as listed below:  Review of Systems  Neurological:       Pt presents today to discuss her memory. Pt was told that she has a mild cognitive impairment.    Objective:  Neurological Exam  Physical Exam Physical Examination:   Vitals:   10/19/17 1005  BP: 133/86  Pulse: 80   General Examination: The patient is a very pleasant 72 y.o. female in no acute distress. She appears well-developed and well-nourished and well groomed.   HEENT: Normocephalic, atraumatic, pupils are equal, round and reactive to light and accommodation. Corrective eyeglasses in place, status post cataracts repairs. Extraocular tracking is good without limitation to gaze excursion or nystagmus noted. Normal smooth pursuit is noted. Hearing is grossly intact. Face is symmetric with normal facial animation and normal facial sensation. Speech is clear with no dysarthria noted. There is no hypophonia. There is no lip, neck/head, jaw or voice tremor. Neck is supple with full range of passive and active motion. There are no carotid bruits on auscultation. Oropharynx exam reveals: mild mouth dryness, adequate dental hygiene and moderate airway crowding, due to quite small airway entry and tonsils of 1+. Mallampati is class I. Tongue protrudes centrally and palate elevates  symmetrically.    Chest: Clear to auscultation without wheezing, rhonchi or crackles noted.  Heart: S1+S2+0, regular and normal without murmurs, rubs or gallops noted.   Abdomen: Soft, non-tender and non-distended with normal bowel sounds appreciated on auscultation.  Extremities: There is no pitting edema in the distal lower extremities bilaterally. Pedal pulses are intact.  Skin: Warm and dry without trophic changes noted.  Musculoskeletal: exam reveals no obvious joint deformities, tenderness or joint swelling or erythema.   Neurologically:  Mental status: The patient is awake, alert and oriented in all 4 spheres. Her immediate and remote memory, attention, language skills and fund of knowledge are fairly appropriate, she is able to give her history quite well but has difficulty with timeline and accurate dates.  On 10/19/2017: MMSE: 30/30, CDT: 4/4, AFT: 11/min.  Thought process is linear. Mood is normal and affect is normal.  Cranial nerves II - XII are as described above under HEENT exam. In addition: shoulder shrug is normal with equal shoulder height noted. Motor exam: Normal bulk, strength and tone is noted. There is no drift, tremor or rebound. Romberg is negative. Fine motor skills and coordination: grossly intact.  Cerebellar testing: No dysmetria or intention tremor. There is no truncal or gait ataxia.  Sensory exam: intact to light touch.  Gait, station and balance: She stands easily. No veering to one side is noted. No leaning to one side is noted. Posture is age-appropriate and stance is narrow based. Gait shows normal stride length and normal pace. No problems turning are noted.                Assessment and Plan:   In summary, SANAYA GWILLIAM is a very pleasant 72 y.o.-year old female with an underlying medical history of hypertension, osteopenia, hyperlipidemia, depression, and anxiety, who presents for neurologic consultation of her memory loss of about 1 years  duration. She has on examination mild memory issues. She had recent neuropsychological evaluation which supported the diagnosis of mild cognitive impairment. She does not have a telltale family history of dementia but is unfortunately not familiar with her father side of the history. She has an otherwise nonfocal neurological exam which is reassuring. She had a recent  brain MRI about a year ago. We will continue to monitor her. We talked about the importance of healthy lifestyle, physical activity, healthy nutrition and hydration. She indicates that she sleeps well, nevertheless, she does have a smaller appearing airway and is encouraged to talk to her family about snoring or any breathing dysfunction at night which may have been witnessed by her children or grandchildren. We may pursue a sleep study down the [place]. For now, no additional testing is recommended from my end of things. She can continue with her galantamine at the current dose as well. I will see her back routinely in 6 months, sooner if needed. I answered all her questions today and she was in agreement.  Thank you very much for allowing me to participate in the care of this nice patient. If I can be of any further assistance to you please do not hesitate to call me at [phone].  Sincerely,   Star Age, MD, PhD

## 2017-10-25 DIAGNOSIS — M25562 Pain in left knee: Secondary | ICD-10-CM | POA: Diagnosis not present

## 2017-10-25 DIAGNOSIS — M1712 Unilateral primary osteoarthritis, left knee: Secondary | ICD-10-CM | POA: Diagnosis not present

## 2017-11-17 DIAGNOSIS — M170 Bilateral primary osteoarthritis of knee: Secondary | ICD-10-CM | POA: Diagnosis not present

## 2017-11-17 DIAGNOSIS — M25562 Pain in left knee: Secondary | ICD-10-CM | POA: Diagnosis not present

## 2017-11-17 DIAGNOSIS — M1711 Unilateral primary osteoarthritis, right knee: Secondary | ICD-10-CM | POA: Diagnosis not present

## 2017-11-17 DIAGNOSIS — M1712 Unilateral primary osteoarthritis, left knee: Secondary | ICD-10-CM | POA: Diagnosis not present

## 2017-11-30 DIAGNOSIS — F331 Major depressive disorder, recurrent, moderate: Secondary | ICD-10-CM | POA: Diagnosis not present

## 2017-12-01 ENCOUNTER — Encounter: Payer: Self-pay | Admitting: Psychology

## 2017-12-01 ENCOUNTER — Encounter: Payer: Medicare Other | Attending: Psychology | Admitting: Psychology

## 2017-12-01 DIAGNOSIS — F419 Anxiety disorder, unspecified: Secondary | ICD-10-CM | POA: Diagnosis not present

## 2017-12-01 DIAGNOSIS — R413 Other amnesia: Secondary | ICD-10-CM | POA: Diagnosis not present

## 2017-12-01 DIAGNOSIS — G3184 Mild cognitive impairment, so stated: Secondary | ICD-10-CM | POA: Insufficient documentation

## 2017-12-01 NOTE — Progress Notes (Signed)
Today was a follow-up visit to continue to monitor the patient with regard to changes in memory and cognitive functioning.  This is a 63-month follow-up.  The patient was evaluated back in August/September 2018 with the results of the neuropsychological evaluation being consistent with her descriptions of significant and observable difficulties with learning new information and recalling that information after period of delay.  However, there were some areas that the patient did very well and in the formal testing including visual spatial abilities and attention abilities.  There were inconsistencies with typical patterns of degenerative dementia such as Alzheimer's dementia.  The patient did have memory issues and geographic disorientation.  However, today the patient reports that there have been no worsening of the symptoms as far as her subjective experiences.  The patient reports that she is continuing to take care of her grandkids as well as other activities that she had been doing.  The patient reports that she does not feel like her memory issues have changed over the past 9 months.  We have set the patient up for formal follow-up neuropsychological testing utilizing the RBANS B to make direct comparisons to the initial testing that utilized the RBANS A.  Edgardo Roys, PsyD  Neuropsychologist

## 2018-01-02 DIAGNOSIS — M1712 Unilateral primary osteoarthritis, left knee: Secondary | ICD-10-CM | POA: Diagnosis not present

## 2018-01-10 DIAGNOSIS — M1712 Unilateral primary osteoarthritis, left knee: Secondary | ICD-10-CM | POA: Diagnosis not present

## 2018-01-17 DIAGNOSIS — M1712 Unilateral primary osteoarthritis, left knee: Secondary | ICD-10-CM | POA: Diagnosis not present

## 2018-01-20 ENCOUNTER — Encounter: Payer: Self-pay | Admitting: Gynecology

## 2018-01-20 ENCOUNTER — Ambulatory Visit (INDEPENDENT_AMBULATORY_CARE_PROVIDER_SITE_OTHER): Payer: Medicare Other | Admitting: Gynecology

## 2018-01-20 VITALS — BP 122/82 | Ht 64.0 in | Wt 138.0 lb

## 2018-01-20 DIAGNOSIS — N952 Postmenopausal atrophic vaginitis: Secondary | ICD-10-CM

## 2018-01-20 DIAGNOSIS — Z01419 Encounter for gynecological examination (general) (routine) without abnormal findings: Secondary | ICD-10-CM | POA: Diagnosis not present

## 2018-01-20 NOTE — Patient Instructions (Signed)
You are due for your mammogram in February 2020.

## 2018-01-20 NOTE — Progress Notes (Signed)
    Pamela Mason [facility] [birth year] [id number]        72 y.o.  [id number] for breast and pelvic exam.  Patient notify me that she is being seen now for early dementia.  Past medical history,surgical history, problem list, medications, allergies, family history and social history were all reviewed and documented as reviewed in the EPIC chart.  ROS:  Performed with pertinent positives and negatives included in the history, assessment and plan.   Additional significant findings : None   Exam: Wandra Scot assistant Vitals:   01/20/18 0850  BP: 122/82  Weight: 138 lb (62.6 kg)  Height: 5\' 4"  (1.626 m)   Body mass index is 23.69 kg/m.  General appearance:  Normal affect, orientation and appearance. Skin: Grossly normal HEENT: Without gross lesions.  No cervical or supraclavicular adenopathy. Thyroid normal.  Lungs:  Clear without wheezing, rales or rhonchi Cardiac: RR, without RMG Abdominal:  Soft, nontender, without masses, guarding, rebound, organomegaly or hernia Breasts:  Examined lying and sitting without masses, retractions, discharge or axillary adenopathy. Pelvic:  Ext, BUS, Vagina: With atrophic changes  Cervix: With atrophic changes  Uterus: Anteverted, normal size, shape and contour, midline and mobile nontender   Adnexa: Without masses or tenderness    Anus and perineum: Normal   Rectovaginal: Normal sphincter tone without palpated masses or tenderness.    Assessment/Plan:  72 y.o. [id number] female for breast and pelvic exam.   1. Postmenopausal/atrophic genital changes.  No significant menopausal symptoms or any bleeding. 2. Osteopenia.  Being followed by Dr. Haynes Kerns historically.  We will continue to follow-up with him in reference to this.  Does have a history of Fosamax in the past. 3. Colonoscopy 2018.  Repeat at their recommended interval. 4. Pap smear 2015.  No Pap smear done today.  No history of abnormal Pap smears.  We discussed current screening guidelines and both  are comfortable with stop screening based on age. 5. Mammography 06/2017.  Continue with annual mammography when due next year.  Breast exam normal today. 6. Health maintenance.  No routine lab work done as this is done elsewhere.  The following is a list of her mental health providers that she gave me:  Psychiatrist Dr. Berniece Andreas Psychologist Dr. Sima Matas Neurologist Dr. Pauline Aus MD, 9:12 AM 01/20/2018

## 2018-01-24 DIAGNOSIS — F331 Major depressive disorder, recurrent, moderate: Secondary | ICD-10-CM | POA: Diagnosis not present

## 2018-01-31 ENCOUNTER — Encounter: Payer: Medicare Other | Attending: Psychology | Admitting: Psychology

## 2018-01-31 ENCOUNTER — Encounter: Payer: Self-pay | Admitting: Psychology

## 2018-01-31 DIAGNOSIS — G3184 Mild cognitive impairment, so stated: Secondary | ICD-10-CM | POA: Diagnosis not present

## 2018-01-31 DIAGNOSIS — F09 Unspecified mental disorder due to known physiological condition: Secondary | ICD-10-CM

## 2018-01-31 DIAGNOSIS — F0789 Other personality and behavioral disorders due to known physiological condition: Secondary | ICD-10-CM | POA: Diagnosis not present

## 2018-01-31 DIAGNOSIS — R413 Other amnesia: Secondary | ICD-10-CM

## 2018-01-31 DIAGNOSIS — F419 Anxiety disorder, unspecified: Secondary | ICD-10-CM | POA: Insufficient documentation

## 2018-01-31 NOTE — Progress Notes (Signed)
Today I administered the RBANS B neuropsychological test battery to follow-up with direct comparisons to previous assessments utilizing this measure in an alternative version of this measure approximately 1 year ago.  The patient appeared to fully participate during the testing procedures today and this does appear to be a fair and valid assessment.  Today was administration and scoring of this measure.  Administration and scoring took 1 hour.  I will be performing the interpretation and report writing on 02/03/2018.  Feedback to the patient will be will be provided on 02/06/2018.  The reason for the evaluation can be found in the patient's chart.  The patient has continued to report changes in memory and cognitive functioning.  We are performing a five 59-month follow-up visit.  The patient was evaluated back in August/September 2018 with the results of the neuropsychological evaluation being consistent with her description of significant and observable difficulties with learning new information and recalling that information after period of delay.  However, there were some areas that the patient did very well on and in the formal testing including visual spatial abilities and attentional abilities.  There were also inconsistencies with typical patterns of degenerative dementia such as Alzheimer's dementia.  While the patient did have some memory issues and geographic disorientation.  The patient has denied any worsening of her symptoms over the past 9 months to a year.

## 2018-02-03 ENCOUNTER — Encounter (HOSPITAL_BASED_OUTPATIENT_CLINIC_OR_DEPARTMENT_OTHER): Payer: Medicare Other | Admitting: Psychology

## 2018-02-03 ENCOUNTER — Encounter: Payer: Self-pay | Admitting: Psychology

## 2018-02-03 DIAGNOSIS — R413 Other amnesia: Secondary | ICD-10-CM

## 2018-02-03 DIAGNOSIS — F419 Anxiety disorder, unspecified: Secondary | ICD-10-CM

## 2018-02-03 DIAGNOSIS — G3184 Mild cognitive impairment, so stated: Secondary | ICD-10-CM | POA: Diagnosis not present

## 2018-02-03 NOTE — Progress Notes (Addendum)
Patient:  Pamela Mason   DOB: 12-Jun-1945  MR Number: [id number]  Location: [facility] [place], [id number] [place] [id number] Dept: [phone]  Start: 7:45 AM End: 8:45 AM  Provider/Observer:     Edgardo Roys PsyD  Chief Complaint:      Chief Complaint  Patient presents with  . Memory Loss  . Anxiety    Reason For Service:     Coralynn Gaona is a 72 year old female who was referred by Dr.Mark Perini for neuropsychological evaluation due to concerns about possible early symptoms of cognitive dementia.  The patient had been describing increasing short-term memory issues.  She reports that there not particularly significant the time but have been causing some significant problems with functioning.  The patient had been reporting that while she read something she has to go back and repeated again to remember it.  The patient also describes some word finding difficulties.  Initially she had been describing her memory issues as episodic in nature and that sometimes she seems to be doing okay.  She denied any changes in motor function and denied any tremors.  The patient also denied any geographic disorientation and reports that she had continued to drive without problems.  The patient reports that she has been worrying more and having more feelings of anxiety and depression.  She did acknowledge that would not there was a increase in worry and anxiety it was also highly correlated with her memory issues.  During the formal testing last year the patient showed excellent abilities for visual-spatial task as well as attention and concentration measures.  While there were some memory if issues noted the pattern of neuropsychological strengths and weaknesses was not consistent with what those typically seen with other cortical dementias.  The patient does have a past history  of migraine headache that was severe.  Patient did not have visual Aura.    The patient had been evaluated with formal neuropsychological testing in 2018.  During the clinical interview recently as a 1 year follow-up the patient reports that she feels like there was no worsening of her symptoms over the past year and both any objective signs of cognitive decline or subjective experiences.  The patient reports that she is continuing to take care of her grandkids and engage in all other activities that she had been doing.  The patient did not feel like her memory issues have changed over the past 9 months to year.  Testing Administered:  RBANS B  Participation Level:   Active  Participation Quality:  Appropriate and Attentive      Behavioral Observation:  Well Groomed, Alert, and Appropriate.   Test Results:   The patient was administered the RBANS B to allow for direct comparisons to her previous assessment utilizing the A version of this neuropsychological battery.  The patient appeared to fully participate in this does appear to be a fair and valid assessment of her current neuropsychological functioning.  Direct comparisons will be done to both the 2018 testing in the current 2019 testing.  These evaluations are essentially 1 year apart.    Test Results:  Total scale index score 2018 = 86 and 2019 = 99  The total index score is a global composite score of all of the subtest and indices of the neuropsychological test battery.  From a global perspective not only has the patient currently performing in the average range overall the patient is actually improved 15 points over the past year.  This is not indicative in any way of any type of progressive decline in overall functioning.                                                               RBANS Update Form A vs Form B Immediate Memory            2018 = 65   2019 = 81  The patient's immediate memory index score had initially been 1 showing some significant impairment on the first testing.  Her testing in 2018 produced an index score of 65 showing moderate deficits with regard to initial learning and memory.  Today, while the patient is showing some very mild deficits there is a 16 point increase in overall functioning.  While the patient did display some difficulties with story learning she did quite well on learning list of items on the current assessment.  There is clearly no indication of progressive decline in fact the patient's performance has been improving.                                                               RBANS Update Form A  Vs Form B Visuospatial/ Constructional          2018 = 121    2019 = 112  While the patient is overall score on this index is down somewhat from the first testing her initial performance was in the high average range to superior range and her current performance still is in the upper end of the average range.  While this is a relative decline within the patient's own performance both of these measures are indicative of good visual spatial/constructional abilities.                                                               RBANS Update Form A vs Form B  Attention    2018 = 109   2019 = 121  On the initial assessment in 2018 the patient performed in the upper end of the average range and there were no indications of deficits with regard to attention and concentration.    On the assessment in 2019 the patient's performance actually increased significantly and her performance falls in the superior range for functioning relative to her age-matched peers groups.  There are no indications of any deficits with regard to auditory or visual encoding or other as elements of attention and concentration.  She did excellent on task assessing auditory encoding  as well as overall information processing speed  and visual scanning/visual searching abilities.                                                                                     RBANS Update Form A vs Form B Language  2018 = 88 2019 = 82  The patient continues to produce a language index score in the low average range.  Statistically speaking her performance is consistent and essentially the same over this period of time with only a six-point difference (drop) between these 2 assessments.  The patient did excellent on measures of targeted naming task but did have some difficulty with semantic fluency measures.  This was true of both assessments and her deficits had to do with verbal fluency rather than targeted naming difficulties.                                                               RBANS Update Form A vs Form B  Delayed Memory   2018 = 71   2019 = 95  Comparing the 2018 in 2019 assessment scores the patient produced an index score on the current measure that is in the average range and is 24 standard points improvements from the initial assessment.  The patient's delayed memory was quite good for remembering previously learned list as well as recognition of the previously learning list.  She did well for recall of story information and remembered almost all of the information that she initially remembered after only a very brief delay.  The patient also did well on recall of visual information that she previously learned.  The patient's delayed memory function currently falls in the average range.   Summary of Results:                         Overall, the results of these 2 neuropsychological assessments with the first one being conducted in 2018 and the second one being done this month, shows significant improvements in her overall functioning.  The patient's only area of continued weakness has to do with immediate memory and learning for stories and verbal fluency/semantic fluency, although there was improvement over last years  functioning.  All other areas of neuropsychological functioning currently are in the average to superior range of functioning.  The patient has shown significant improvement in almost every area initially assessed when compared to her current functioning.  The patient currently shows no deficits with regard to delayed memory and only mild difficulties for immediate memory.  Attention and concentration, visual-spatial abilities are in the high average to superior range of functioning.    As before, her current patterns of neuropsychological strengths and weaknesses are not consistent with those typically seen with degenerative types of cortical dementia.  The patient did have MRI in 2018 that show no acute infarct etc but some mild generalized cerebral atrophy consistent with age.   There was  some scattered small foci of hyperintensity in the cerebral white matter and pons compatible with chronic small vessel ischemic disease.  White matter changes could explain issues with verbal fluency and memory retrieval.  The patient has a history of severe migraines from puberty to menopause happening each month at menstrual cycle.   There may have been some vascular issues associated with the recurrent chronic migraines or it is also possible that she is still having the prodromal phase and aura phase.  The patient denies ever having visual aura but the language and memory issues may be related to left frontal aura phase.  The patient reports that she stopped having the Attack Phase of migraines after menopause.  The patient is improving over past year.  Another possible culprit for some of her memory and verbal fluency issues are likely related to some anxiety which tends to affect these particular areas of functioning.   I do not think the patient would have any difficulty taking care of her grandchildren are going about in her normal day-to-day life.  If the patient does start having renewed worsening, neurological  review for possible vascular involvement would be warranted.  I have instructed the patient to maintain good diet, physical activity and sleep patterns to address what she can about small vessel ischemic disease.      Diagnosis:    Axis I: Anxiety  Memory impairment of gradual onset   Ilean Skill, Psy.D. Neuropsychologist

## 2018-02-06 ENCOUNTER — Encounter (HOSPITAL_BASED_OUTPATIENT_CLINIC_OR_DEPARTMENT_OTHER): Payer: Medicare Other | Admitting: Psychology

## 2018-02-06 ENCOUNTER — Encounter: Payer: Self-pay | Admitting: Psychology

## 2018-02-06 DIAGNOSIS — G3184 Mild cognitive impairment, so stated: Secondary | ICD-10-CM

## 2018-02-06 DIAGNOSIS — R413 Other amnesia: Secondary | ICD-10-CM

## 2018-02-06 DIAGNOSIS — F419 Anxiety disorder, unspecified: Secondary | ICD-10-CM

## 2018-02-06 NOTE — Progress Notes (Signed)
Today I provided feedback regarding the results of the current neuropsychological evaluation.  I will add a copy to the note today from the summary from her formal evaluation that was written up on 02/03/2018.  The patient has improved significantly over the past year with regard to cognitive functioning and while there does continue to be some mild issues related to verbal fluency and initial memory retrieval the patient does not show patterns consistent with any type of degenerative dementia.  The issues that may caused her problems may be related to some local small vessel disease affecting cortical white matter and/or issues related to her adult history of migraines and possible aura phases continuing but not having the attack phase.  Below you will find a summary of the results that were presented to the patient today.  Summary of Results: Overall, the results of these 2 neuropsychological assessments with the first one being conducted in 2018 and the second one being done this month, shows significant improvements in her overall functioning.  The patient's only area of continued weakness has to do with immediate memory and learning for stories and verbal fluency/semantic fluency, although there was improvement over last years functioning.  All other areas of neuropsychological functioning currently are in the average to superior range of functioning.  The patient has shown significant improvement in almost every area initially assessed when compared to her current functioning.  The patient currently shows no deficits with regard to delayed memory and only mild difficulties for immediate memory.  Attention and concentration, visual-spatial abilities are in the high average to superior range of functioning.    As before, her current patterns of neuropsychological strengths and weaknesses are not consistent with those typically seen with degenerative types of cortical dementia.  The  patient did have MRI in 2018 that show no acute infarct etc but some mild generalized cerebral atrophy consistent with age.   There was some scattered small foci of hyperintensity in the cerebral white matter and pons compatible with chronic small vessel ischemic disease.  White matter changes could explain issues with verbal fluency and memory retrieval.  The patient has a history of severe migraines from puberty to menopause happening each month at menstrual cycle.   There may have been some vascular issues associated with the recurrent chronic migraines or it is also possible that she is still having the prodromal phase and aura phase.  The patient denies ever having visual aura but the language and memory issues may be related to left frontal aura phase.  The patient reports that she stopped having the Attack Phase of migraines after menopause.  The patient is improving over past year.  Another possible culprit for some of her memory and verbal fluency issues are likely related to some anxiety which tends to affect these particular areas of functioning.   I do not think the patient would have any difficulty taking care of her grandchildren are going about in her normal day-to-day life.  If the patient does start having renewed worsening, neurological review for possible vascular involvement would be warranted.  I have instructed the patient to maintain good diet, physical activity and sleep patterns to address what she can about small vessel ischemic disease.      Diagnosis:                               Axis I: Anxiety  Memory impairment of gradual onset  Ilean Skill, Psy.D. Neuropsychologist

## 2018-02-15 DIAGNOSIS — Z23 Encounter for immunization: Secondary | ICD-10-CM | POA: Diagnosis not present

## 2018-02-20 DIAGNOSIS — Z6823 Body mass index (BMI) 23.0-23.9, adult: Secondary | ICD-10-CM | POA: Diagnosis not present

## 2018-02-20 DIAGNOSIS — K529 Noninfective gastroenteritis and colitis, unspecified: Secondary | ICD-10-CM | POA: Diagnosis not present

## 2018-03-01 DIAGNOSIS — K529 Noninfective gastroenteritis and colitis, unspecified: Secondary | ICD-10-CM | POA: Diagnosis not present

## 2018-03-01 DIAGNOSIS — R197 Diarrhea, unspecified: Secondary | ICD-10-CM | POA: Diagnosis not present

## 2018-03-08 DIAGNOSIS — R197 Diarrhea, unspecified: Secondary | ICD-10-CM | POA: Diagnosis not present

## 2018-03-08 DIAGNOSIS — K529 Noninfective gastroenteritis and colitis, unspecified: Secondary | ICD-10-CM | POA: Diagnosis not present

## 2018-03-08 DIAGNOSIS — Z6823 Body mass index (BMI) 23.0-23.9, adult: Secondary | ICD-10-CM | POA: Diagnosis not present

## 2018-03-24 IMAGING — MR MR HEAD W/O CM
11 series · 48 of 48 positions shown · non-contrast
Comparison: None.

CLINICAL DATA: Memory loss.

EXAM:
MRI HEAD WITHOUT CONTRAST
TECHNIQUE: Multiplanar, multiecho pulse sequences of the brain and surrounding
structures were obtained without intravenous contrast.

[Series 2: T1 · sagittal · 5.0mm · 0.45mm/px · 1 of 21 slices shown]
[im 1/21]
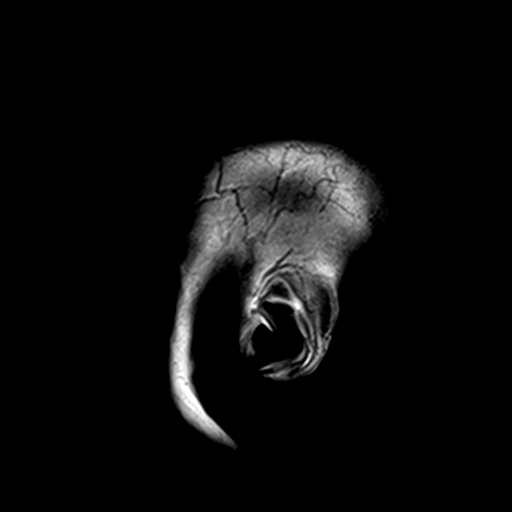

[Series 3: DWI · axial · 3.0mm · 1.80mm/px · z∈[-43,+103]mm · 9 of 100 slices shown (1 of 4)]
[im 1/100]
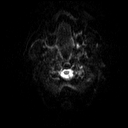
[im 13/100]
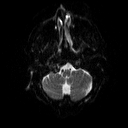
[im 25/100]
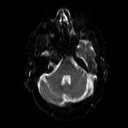
[im 38/100]
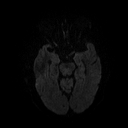
[im 50/100]
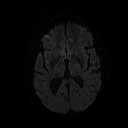
[im 62/100]
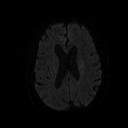
[im 75/100]
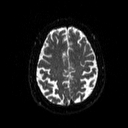
[im 87/100]
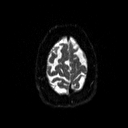
[im 100/100]
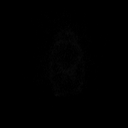

[Series 4: DWI · axial · 3.0mm · 1.80mm/px · z∈[-43,+103]mm · 4 of 48 slices shown (2 of 4)]
[im 1/48]
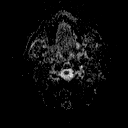
[im 16/48]
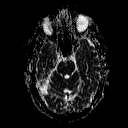
[im 32/48]
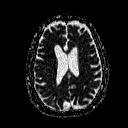
[im 48/48]
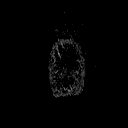

[Series 5: DWI · coronal · 5.0mm · 1.80mm/px · 6 of 68 slices shown (3 of 4)]
[im 1/68]
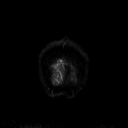
[im 14/68]
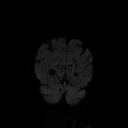
[im 27/68]
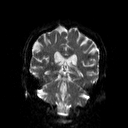
[im 41/68]
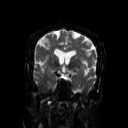
[im 54/68]
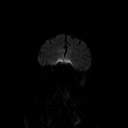
[im 68/68]
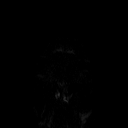

[Series 6: DWI · coronal · 5.0mm · 1.80mm/px · 3 of 34 slices shown (4 of 4)]
[im 1/34]
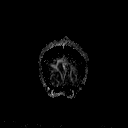
[im 17/34]
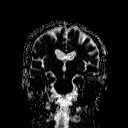
[im 34/34]
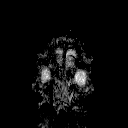

[Series 7: T2 · axial · 5.0mm · 0.51mm/px · z∈[-42,+104]mm · 2 of 22 slices shown (1 of 2)]
[im 1/22]
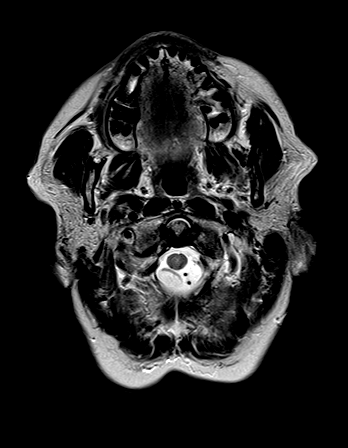
[im 22/22]
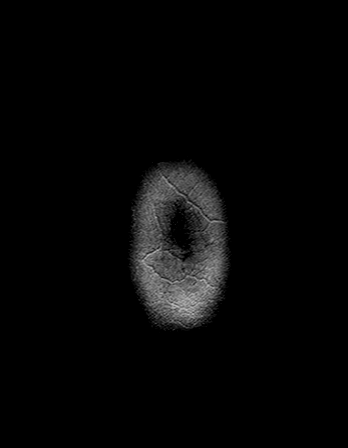

[Series 8: FLAIR · axial · 3.0mm · 0.45mm/px · z∈[-40,+103]mm · 3 of 32 slices shown]
[im 1/32]
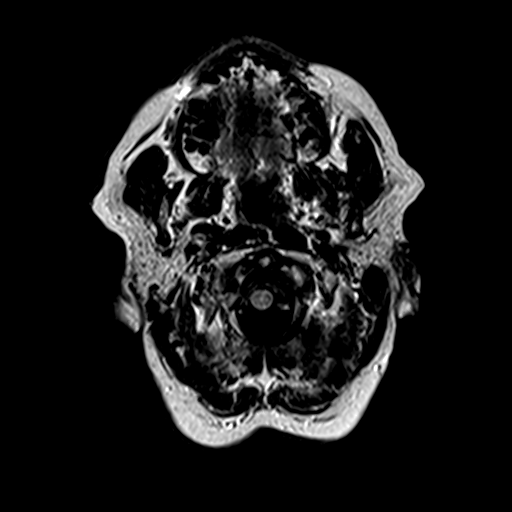
[im 16/32]
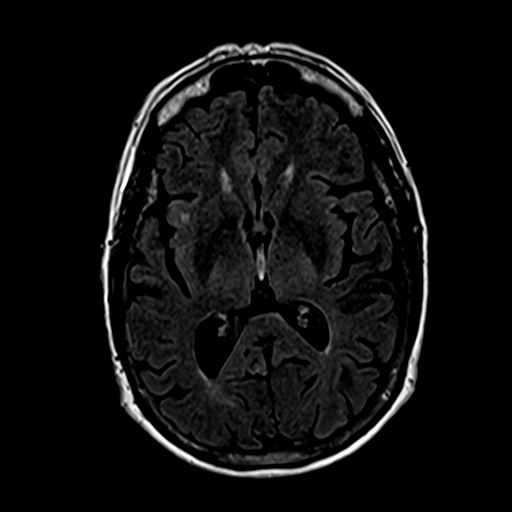
[im 32/32]
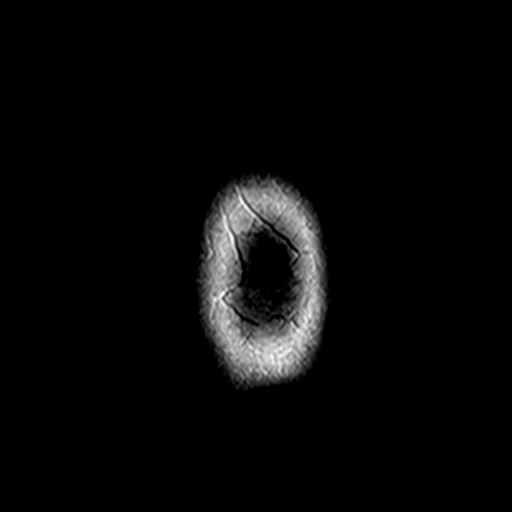

[Series 9: mip_images(sw) · axial · 40.0mm · 0.90mm/px · z∈[-23,+86]mm · 2 of 23 slices shown]
[im 1/23]
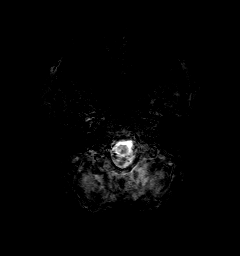
[im 23/23]
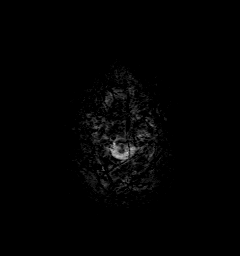

[Series 10: swi_images · axial · 5.0mm · 0.90mm/px · z∈[-40,+104]mm · 3 of 30 slices shown]
[im 1/30]
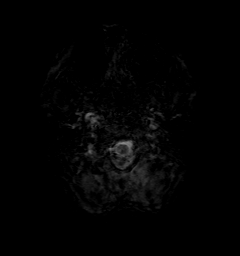
[im 15/30]
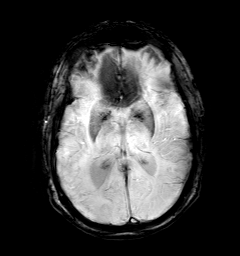
[im 30/30]
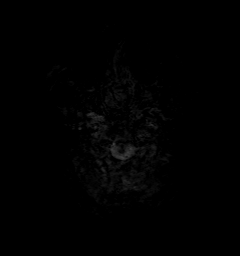

[Series 11: t1_mpr_tra · axial · 1.0mm · 0.72mm/px · z∈[-40,+102]mm · 13 of 144 slices shown]
[im 1/144]
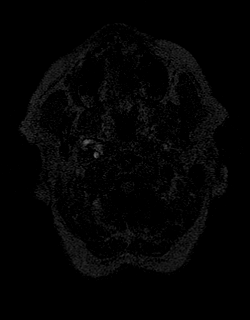
[im 12/144]
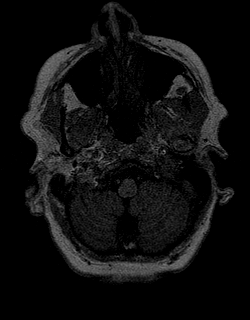
[im 24/144]
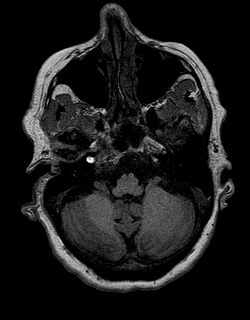
[im 36/144]
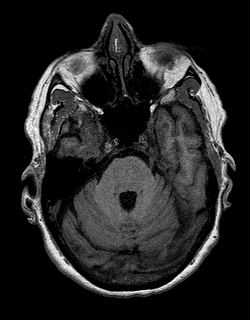
[im 48/144]
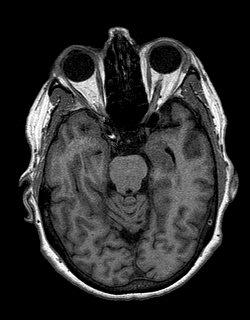
[im 60/144]
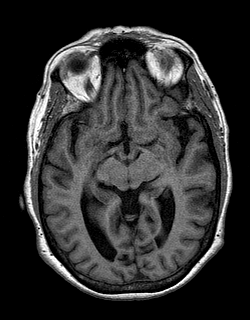
[im 72/144]
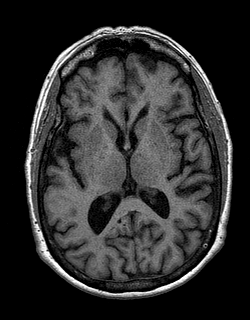
[im 84/144]
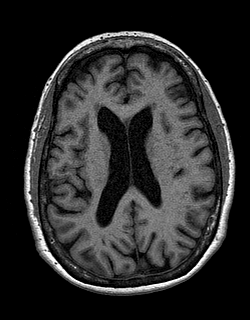
[im 96/144]
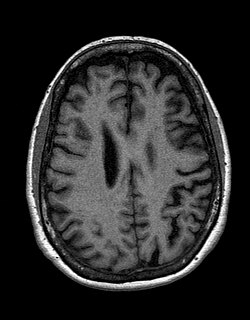
[im 108/144]
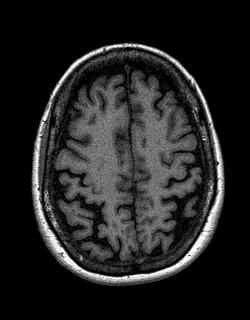
[im 120/144]
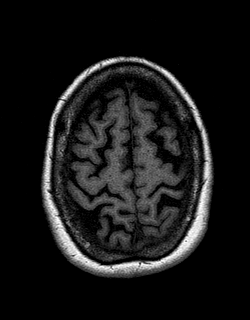
[im 132/144]
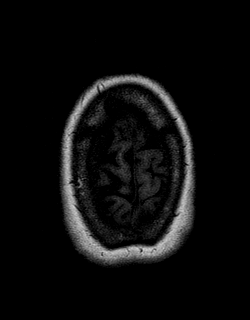
[im 144/144]
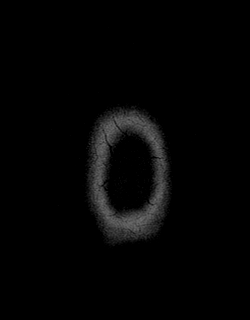

[Series 12: T2 · coronal · 5.0mm · 0.45mm/px · 2 of 25 slices shown (2 of 2)]
[im 1/25]
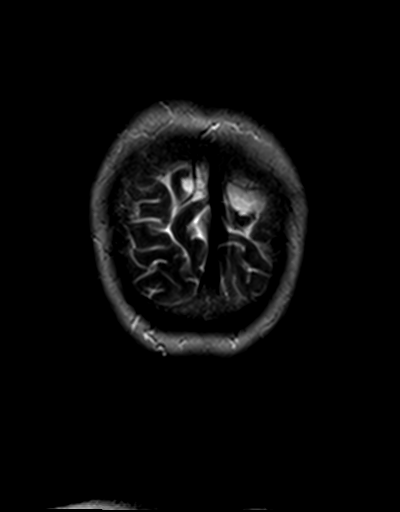
[im 25/25]
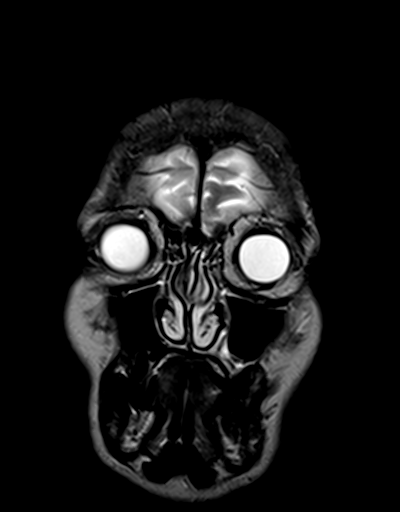

[48 of 48 positions shown; findings below may reference images not displayed]

FINDINGS: Brain: There is no evidence of acute infarct, intracranial
hemorrhage, mass, midline shift, or extra-axial fluid collection.
There is mild-to-moderate generalized cerebral atrophy. Scattered
small foci of T2 hyperintensity in the cerebral white matter and
pons are nonspecific but compatible with minimal chronic small
vessel ischemic disease, not greater than expected for patient's
age.

Vascular: Major intracranial vascular flow voids are preserved.

Skull and upper cervical spine: No suspicious marrow lesion.

Sinuses/Orbits: Trace right mastoid fluid and minimal scattered
paranasal sinus mucosal thickening. Bilateral cataract extraction.

Other: None.
IMPRESSION: 1. No acute intracranial abnormality or mass.
2. Mild-to-moderate cerebral atrophy.

## 2018-03-29 ENCOUNTER — Encounter: Payer: Self-pay | Admitting: Internal Medicine

## 2018-03-29 ENCOUNTER — Ambulatory Visit (INDEPENDENT_AMBULATORY_CARE_PROVIDER_SITE_OTHER): Payer: Medicare Other | Admitting: Internal Medicine

## 2018-03-29 VITALS — BP 116/78 | HR 72 | Ht 64.0 in | Wt 128.4 lb

## 2018-03-29 DIAGNOSIS — R197 Diarrhea, unspecified: Secondary | ICD-10-CM | POA: Diagnosis not present

## 2018-03-29 DIAGNOSIS — A09 Infectious gastroenteritis and colitis, unspecified: Secondary | ICD-10-CM

## 2018-03-29 NOTE — Patient Instructions (Signed)
Take 1 Restora a day for a month  You may take 1-2 Imodium daily for diarrhea

## 2018-03-29 NOTE — Progress Notes (Signed)
HISTORY OF PRESENT ILLNESS:  Pamela Mason is a 72 y.o. female with past medical history as listed below who presents today regarding problems with change in bowel habits, specifically diarrhea.  The patient was last seen in this facility August 16, 2016 when she underwent routine screening colonoscopy.  Examination was normal except for sigmoid diverticulosis and internal hemorrhoids.  Given her age, no routine follow-up screening recommended.  She tells me that she was in her usual state of health until October when she developed pretty significant watery diarrhea for several days.  There was no associated nausea, vomiting, abdominal pain, or fever.  No bleeding.  She was seen by Dr. Joylene Draft and was, according to the patient, treated with an unspecified antibiotic.  Her problems with diarrhea resolved for a short while then return.  Currently she describes before loose bowel movements daily exclusively after meals.  No nocturnal component.  Again, no nausea, vomiting, fever, bleeding, or pain.  Bowels are more formed than initially.  Now described as soft.  No incontinence.  She rarely used Kaopectate.  She has had mild weight loss.  She is going [place] next week with her family for Thanksgiving  REVIEW OF SYSTEMS:  All non-GI ROS negative except for arthritis, depression  Past Medical History:  Diagnosis Date  . Dementia ([place])   . Depression   . Hyperlipidemia   . Hypertension   . Osteopenia     Past Surgical History:  Procedure Laterality Date  . INTRACAPSULAR CATARACT EXTRACTION    . ROTATOR CUFF REPAIR    . TUBAL LIGATION      Social History Pamela Mason  reports that she has quit smoking. She has never used smokeless tobacco. She reports that she drinks about 12.0 standard drinks of alcohol per week. She reports that she does not use drugs.  family history includes Heart disease in her father; Hypertension in her mother; Ovarian cancer in her mother.  Allergies   Allergen Reactions  . Tape Other (See Comments)    redness       PHYSICAL EXAMINATION: Vital signs: BP 116/78   Pulse 72   Ht 5\' 4"  (1.626 m)   Wt 128 lb 6 oz (58.2 kg)   BMI 22.04 kg/m   Constitutional: generally well-appearing, no acute distress Psychiatric: alert and oriented x3, cooperative Eyes: extraocular movements intact, anicteric, conjunctiva pink Mouth: oral pharynx moist, no lesions Neck: supple no lymphadenopathy Cardiovascular: heart regular rate and rhythm, no murmur Lungs: clear to auscultation bilaterally Abdomen: soft, nontender, nondistended, no obvious ascites, no peritoneal signs, normal bowel sounds, no organomegaly Rectal: Omitted Extremities: no clubbing, cyanosis, or lower extremity edema bilaterally Skin: no lesions on visible extremities Neuro: No focal deficits.  Cranial nerves intact  ASSESSMENT:  1.  Acute diarrheal illness as described.  Followed by postprandial loose stools as described.  Initial insult was almost certainly a viral gastroenteritis.  The current symptom complex is the residual of that with a postinfectious motility disturbance as is often seen.  No alarm features 2.  Colonoscopy last year as described  PLAN:  1.  Reassurance 2.  Probiotic Restora 1 daily.  1 month of samples provided 3.  Okay to use Imodium once or twice daily as needed 4.  Time 5.  GI follow-up if symptoms persist despite the above measures  25-minute spent face-to-face with the patient.  Greater than 50% the time used for counseling regarding her diarrheal illness and its management

## 2018-04-20 ENCOUNTER — Encounter: Payer: Self-pay | Admitting: Neurology

## 2018-04-20 ENCOUNTER — Ambulatory Visit (INDEPENDENT_AMBULATORY_CARE_PROVIDER_SITE_OTHER): Payer: Medicare Other | Admitting: Neurology

## 2018-04-20 VITALS — BP 123/79 | HR 67 | Ht 64.0 in | Wt 127.0 lb

## 2018-04-20 DIAGNOSIS — G3184 Mild cognitive impairment, so stated: Secondary | ICD-10-CM | POA: Diagnosis not present

## 2018-04-20 NOTE — Progress Notes (Signed)
Subjective:    Patient ID: Pamela Mason is a 72 y.o. female.  HPI     Interim history:   Pamela Mason is a 72 year old right-handed woman with an underlying medical history of hypertension, osteopenia, hyperlipidemia, depression, and anxiety, who  for follow-up consultation of her memory loss. The patient is unaccompanied today. I first met her on 10/19/2013 at the request of her primary care physician, at which time she reported forgetfulness and her kids had noticed that she would ask the same questions over and over again. She also had neuropsychological testing prior to the visit with me which supported the diagnosis of mild cognitive impairment. She had been on galantamine 8 mg daily. She had a prior MRI in June 2018 which did show mild to moderate atrophy. We had talked about her snoring at the time and considered a sleep study.  Today, 04/20/2018: She reports doing well. She had the stomach flu unfortunately in October 2019 but is recuperating from it. Generally speaking, she eats well. She is still living independently, her 3 children are spread out, she has 7 grandchildren. She still drives to [place] without problems. She tries to hydrate well. She continues to be on galantamine 8 mg. She sees Pamela Mason and requests that I send her a copy of our visit today. She feels that she sleeps fairly well although she does snore. Epworth sleepiness score is 6 out of 24, fatigue score is 15 out of 63. Bedtime is around 10, rise time around 7. She denies recurrent headaches at this time she has nocturia about once per average night. She would like to hold off on sleep study testing at this time. She has no pets and does not typically watch TV at night while in bed.  The patient's allergies, current medications, family history, past medical history, past social history, past surgical history and problem list were reviewed and updated as appropriate.   Previously:  10/19/2017: (She)  reports short-term memory problems for the past year or so, noted primarily by her children as she was forgetful and asks the same questions over again. I reviewed your office note from 08/01/17, which you kindly included. She had neuropsychological evaluation with Pamela Mason in September 2018 which indicated mild cognitive impairment and depression/anxiety, mild early dementia not excluded. Retesting was recommended in 6-9 months. She has been on galantamine 8 mg strength. She is on oral vitamin B12 supplement. For her depression she is on Prozac which was increased last year. She is widowed and lives alone. She is retired. She finished college. She has 3 children, smoked briefly in the 38s but quit in 67. She drinks alcohol in the form of wine, 1-2 glasses every other day or so. She does not have a history of alcohol dependence. She drinks caffeine in the form of coffee and soda. She had a brain MRI without contrast on 11/01/2016 and I reviewed the results: IMPRESSION: 1. No acute intracranial abnormality or mass. 2. Mild-to-moderate cerebral atrophy.   She tries to hydrate well with water. She sleeps well. She tries to be in bed around 10 and rise time is between 6 and 7. She's not sure if she snores. Her husband died at age 67 about 58 years ago. She does not have a BF or significant other. Her son, Pamela Mason is the oldest and lives in [place]. She is comfortable driving to [place] and [place], [place] where her youngest daughter Pamela Mason lives. Her middle daughter Pamela Mason lives in [place] and  she flies to [place], she recently returned from a visit. She has 7 grandchildren. She has not noticed any difficulty driving and uses a GPS successfully. She drove herself to this appointment. She has a follow-up appointment with Pamela Mason in July. She sees her psychiatrist on a regular basis, Pamela Mason out of [place]. She was changed to Pristiq, and is now in the process of tapering off of Pristiq  and starting sertraline titration. She has no family history of dementia, but she is not aware of her father's side of the history, her parents were divorced and she did not know her father after that. Her mother lived to be 39 years old and died from cancer. She has 1 sister, Pamela Mason who is 3 years younger and one brother, Pamela Mason who is 4 years younger, neither sibling with memory loss as far she knows. Her weight has remained stable. 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 She has been on galantamine since last year but does not know how long it has been. She tries to exercise in the form of walking in her neighborhood.  His Past Medical History Is Significant For: Past Medical History:  Diagnosis Date  . Dementia ([place])   . Depression   . Hyperlipidemia   . Hypertension   . Osteopenia     His Past Surgical History Is Significant For: Past Surgical History:  Procedure Laterality Date  . INTRACAPSULAR CATARACT EXTRACTION    . ROTATOR CUFF REPAIR    . TUBAL LIGATION      His Family History Is Significant For: Family History  Problem Relation Age of Onset  . Hypertension Mother   . Ovarian cancer Mother   . Heart disease Father   . Colon cancer Neg Hx     His Social History Is Significant For: Social History   Socioeconomic History  . Marital status: Widowed    Spouse name: Not on file  . Number of children: Not on file  . Years of education: Not on file  . Highest education level: Not on file  Occupational History  . Not on file  Social Needs  . Financial resource strain: Not on file  . Food insecurity:    Worry: Not on file    Inability: Not on file  . Transportation needs:    Medical: Not on file    Non-medical: Not on file  Tobacco Use  . Smoking status: Former Research scientist (life sciences)  . Smokeless tobacco: Never Used  Substance and Sexual Activity  . Alcohol use: Yes    Alcohol/week: 12.0 standard drinks    Types: 7 Glasses of wine, 5 Standard drinks or equivalent  per week    Comment: wine daily  . Drug use: No  . Sexual activity: Not Currently    Birth control/protection: Post-menopausal    Comment: 1st intercourse 25 yrs-Fewer than 5 partners  Lifestyle  . Physical activity:    Days per week: Not on file    Minutes per session: Not on file  . Stress: Not on file  Relationships  . Social connections:    Talks on phone: Not on file    Gets together: Not on file    Attends religious service: Not on file    Active member of club or organization: Not on file    Attends meetings of clubs or organizations: Not on file    Relationship status: Not on file  Other Topics Concern  . Not on file  Social History Narrative  .  Not on file    His Allergies Are:  Allergies  Allergen Reactions  . Tape Other (See Comments)    redness  :   His Current Medications Are:  Outpatient Encounter Medications as of 04/20/2018  Medication Sig  . Cyanocobalamin (RA VITAMIN B-12 TR) 1000 MCG TBCR Take 1 tablet by mouth daily.  Marland Kitchen galantamine (RAZADYNE ER) 8 MG 24 hr capsule Take 8 mg by mouth daily with breakfast.  . lisinopril (PRINIVIL,ZESTRIL) 10 MG tablet Take 1 tablet by mouth daily.  . Pediatric Multivitamins-Iron (FLINTSTONES PLUS IRON PO) Take by mouth.  . sertraline (ZOLOFT) 50 MG tablet Take 100 mg by mouth daily.   . Vitamin D, Ergocalciferol, (DRISDOL) 1.25 MG (50000 UT) CAPS capsule Take 50,000 Units by mouth every 7 (seven) days.   No facility-administered encounter medications on file as of 04/20/2018.   :  Review of Systems:  Out of a complete 14 point review of systems, all are reviewed and negative with the exception of these symptoms as listed below: Review of Systems  Neurological:       Pt presents today to discuss her memory. Pt also reports that she has been told that she snores. Pt has never had a sleep study.  Epworth Sleepiness Scale 0= would never doze 1= slight chance of dozing 2= moderate chance of dozing 3= high chance of  dozing  Sitting and reading: 1 Watching TV: 2 Sitting inactive in a public place (ex. Theater or meeting): 0 As a passenger in a car for an hour without a break: 1 Lying down to rest in the afternoon: 2 Sitting and talking to someone: 0 Sitting quietly after lunch (no alcohol): 0 In a car, while stopped in traffic: 0 Total: 6     Objective:  Neurological Exam  Physical Exam Physical Examination:   Vitals:   04/20/18 0928  BP: 123/79  Pulse: 67    General Examination: The patient is a very pleasant 72 y.o. female in no acute distress. He appears well-developed and well-nourished and well groomed.   HEENT: Normocephalic, atraumatic, pupils are equal, round and reactive to light and accommodation. Corrective eyeglasses in place, status post cataracts repairs. Extraocular tracking is good without limitation to gaze excursion or nystagmus noted. Normal smooth pursuit is noted. Hearing is grossly intact. Face is symmetric with normal facial animation and normal facial sensation. Speech is clear with no dysarthria noted. There is no hypophonia. Neck is supple with full range of passive and active motion. There are no carotid bruits on auscultation. Oropharynx exam reveals: mild mouth dryness, adequate dental hygiene and moderate airway crowding, due to quite small airway entry and tonsils of 1+, thicker soft palate, larger appearing uvula. Mallampati is class I.   Chest: Clear to auscultation without wheezing, rhonchi or crackles noted.  Heart: S1+S2+0, regular and normal without murmurs, rubs or gallops noted.   Abdomen: Soft, non-tender and non-distended with normal bowel sounds appreciated on auscultation.  Extremities: There is no pitting edema in the distal lower extremities bilaterally.   Skin: Warm and dry without trophic changes noted.  Musculoskeletal: exam reveals no obvious joint deformities, tenderness or joint swelling or erythema.   Neurologically:  Mental  status: The patient is awake, alert and oriented in all 4 spheres. Her immediate and remote memory, attention, language skills and fund of knowledge are fairly appropriate, she is able to give her history quite well but has difficulty with timeline and accurate dates.  On 10/19/2017: MMSE: 30/30, CDT:  4/4, AFT: 11/min.  On 04/20/2018: MMSE: 30/30, CDT: 4/4, AFT: 8/min.  Thought process is linear. Mood is normal and affect is normal.  Cranial nerves II - XII are as described above under HEENT exam.  Motor exam: Normal bulk, strength and tone is noted. There is no tremor. Fine motor skills and coordination: grossly intact.  Cerebellar testing: No dysmetria or intention tremor. There is no truncal or gait ataxia.  Sensory exam: intact to light touch.  Gait, station and balance: She stands easily. No veering to one side is noted. No leaning to one side is noted. Posture is age-appropriate and stance is narrow based. Gait shows normal stride length and normal pace. No problems turning are noted.                Assessment and Plan:   In summary, Pamela Mason is a very pleasant 72 year old female with an underlying medical history of hypertension, osteopenia, hyperlipidemia, depression, and anxiety, who presents for neurologic follow-up consultation of her memory loss of about 18 months duration. She has mild issues. Neuropsychological evaluation last year supported the diagnosis of MCI. She had a brain MRI in June 2018. We may repeat this at some point and also repeat her neuropsychological testing and some point in the future. For now, we mutually agreed to continue with her current management. I did ask her to consider coming in for a sleep study. She would like to hold off at this time and would like to revisit this at our next visit. We talked about the importance of healthy lifestyle including exercise, healthy nutrition and good hydration with water. She is encouraged to monitor her ability to  drive.  For now, no additional testing will be ordered and She is advised to follow-up routinely in 6 months, sooner, if needed. I answered all her questions today and she was in agreement. I spent 25 minutes in total face-to-face time with the patient, more than 50% of which was spent in counseling and coordination of care, reviewing test results, reviewing medication and discussing or reviewing the diagnosis of memory loss, its prognosis and treatment options. Pertinent laboratory and imaging test results that were available during this visit with the patient were reviewed by me and considered in my medical decision making (see chart for details).

## 2018-04-20 NOTE — Patient Instructions (Addendum)
Please consider doing a sleep study to rule out sleep apnea, as sleep apnea in the moderate to severe range may increase the risk for vascular complications, including heart disease and stroke and may increase the risk for dementia.  We can certainly revisit next time.  I am glad you are doing well.  Continue with current management. See you in 6 months.

## 2018-05-11 ENCOUNTER — Other Ambulatory Visit: Payer: Self-pay | Admitting: Internal Medicine

## 2018-05-11 DIAGNOSIS — Z1231 Encounter for screening mammogram for malignant neoplasm of breast: Secondary | ICD-10-CM

## 2018-05-24 DIAGNOSIS — D2272 Melanocytic nevi of left lower limb, including hip: Secondary | ICD-10-CM | POA: Diagnosis not present

## 2018-05-24 DIAGNOSIS — L821 Other seborrheic keratosis: Secondary | ICD-10-CM | POA: Diagnosis not present

## 2018-05-24 DIAGNOSIS — D225 Melanocytic nevi of trunk: Secondary | ICD-10-CM | POA: Diagnosis not present

## 2018-05-24 DIAGNOSIS — D1801 Hemangioma of skin and subcutaneous tissue: Secondary | ICD-10-CM | POA: Diagnosis not present

## 2018-06-05 DIAGNOSIS — F331 Major depressive disorder, recurrent, moderate: Secondary | ICD-10-CM | POA: Diagnosis not present

## 2018-06-19 ENCOUNTER — Ambulatory Visit: Payer: Medicare Other

## 2018-06-21 ENCOUNTER — Ambulatory Visit
Admission: RE | Admit: 2018-06-21 | Discharge: 2018-06-21 | Disposition: A | Discharge status: Home / Self Care | Payer: Medicare Other | Source: Ambulatory Visit | Attending: Internal Medicine | Admitting: Internal Medicine

## 2018-06-21 DIAGNOSIS — Z1231 Encounter for screening mammogram for malignant neoplasm of breast: Secondary | ICD-10-CM | POA: Diagnosis not present

## 2018-08-24 DIAGNOSIS — M859 Disorder of bone density and structure, unspecified: Secondary | ICD-10-CM | POA: Diagnosis not present

## 2018-08-24 DIAGNOSIS — R7301 Impaired fasting glucose: Secondary | ICD-10-CM | POA: Diagnosis not present

## 2018-08-24 DIAGNOSIS — I10 Essential (primary) hypertension: Secondary | ICD-10-CM | POA: Diagnosis not present

## 2018-08-24 DIAGNOSIS — E538 Deficiency of other specified B group vitamins: Secondary | ICD-10-CM | POA: Diagnosis not present

## 2018-08-24 DIAGNOSIS — E7849 Other hyperlipidemia: Secondary | ICD-10-CM | POA: Diagnosis not present

## 2018-08-25 DIAGNOSIS — I10 Essential (primary) hypertension: Secondary | ICD-10-CM | POA: Diagnosis not present

## 2018-08-25 DIAGNOSIS — R82998 Other abnormal findings in urine: Secondary | ICD-10-CM | POA: Diagnosis not present

## 2018-08-29 DIAGNOSIS — F331 Major depressive disorder, recurrent, moderate: Secondary | ICD-10-CM | POA: Diagnosis not present

## 2018-08-31 DIAGNOSIS — H9193 Unspecified hearing loss, bilateral: Secondary | ICD-10-CM | POA: Diagnosis not present

## 2018-08-31 DIAGNOSIS — R7301 Impaired fasting glucose: Secondary | ICD-10-CM | POA: Diagnosis not present

## 2018-08-31 DIAGNOSIS — I10 Essential (primary) hypertension: Secondary | ICD-10-CM | POA: Diagnosis not present

## 2018-08-31 DIAGNOSIS — M8580 Other specified disorders of bone density and structure, unspecified site: Secondary | ICD-10-CM | POA: Diagnosis not present

## 2018-08-31 DIAGNOSIS — E538 Deficiency of other specified B group vitamins: Secondary | ICD-10-CM | POA: Diagnosis not present

## 2018-08-31 DIAGNOSIS — Z85820 Personal history of malignant melanoma of skin: Secondary | ICD-10-CM | POA: Diagnosis not present

## 2018-08-31 DIAGNOSIS — Z0000 Encounter for general adult medical examination without abnormal findings: Secondary | ICD-10-CM | POA: Diagnosis not present

## 2018-08-31 DIAGNOSIS — F329 Major depressive disorder, single episode, unspecified: Secondary | ICD-10-CM | POA: Diagnosis not present

## 2018-08-31 DIAGNOSIS — E785 Hyperlipidemia, unspecified: Secondary | ICD-10-CM | POA: Diagnosis not present

## 2018-08-31 DIAGNOSIS — R413 Other amnesia: Secondary | ICD-10-CM | POA: Diagnosis not present

## 2018-08-31 DIAGNOSIS — E559 Vitamin D deficiency, unspecified: Secondary | ICD-10-CM | POA: Diagnosis not present

## 2018-08-31 DIAGNOSIS — Z1331 Encounter for screening for depression: Secondary | ICD-10-CM | POA: Diagnosis not present

## 2018-09-05 ENCOUNTER — Other Ambulatory Visit: Payer: Self-pay | Admitting: Internal Medicine

## 2018-09-05 DIAGNOSIS — E785 Hyperlipidemia, unspecified: Secondary | ICD-10-CM

## 2018-09-14 DIAGNOSIS — M1712 Unilateral primary osteoarthritis, left knee: Secondary | ICD-10-CM | POA: Diagnosis not present

## 2018-09-21 DIAGNOSIS — M1712 Unilateral primary osteoarthritis, left knee: Secondary | ICD-10-CM | POA: Diagnosis not present

## 2018-09-28 DIAGNOSIS — M1712 Unilateral primary osteoarthritis, left knee: Secondary | ICD-10-CM | POA: Diagnosis not present

## 2018-10-18 ENCOUNTER — Telehealth: Payer: Self-pay | Admitting: Neurology

## 2018-10-18 NOTE — Telephone Encounter (Signed)
Due to current COVID 19 pandemic, our office is severely reducing in office visits until further notice, in order to minimize the risk to our patients and healthcare providers.   Called patient regarding her 6/15 appointment. LVM asking her to call me back and gave my direct contact info for patient to call me back to discuss virtual visit.

## 2018-10-19 NOTE — Telephone Encounter (Signed)
I called pt. Pt's meds, allergies, and PMH were updated.  Pt reports that she is doing fairly well. She considered her sleep study and is interested in discussing it further with Dr. Rexene Alberts. Pt has never had a sleep study and does not know if she snores.  MMSE was completed via phone: 19/22, AFT of 6.  Pt verbalized understanding of doxy.me process.  Epworth Sleepiness Scale 0= would never doze 1= slight chance of dozing 2= moderate chance of dozing 3= high chance of dozing  Sitting and reading: 0 Watching TV: 1 Sitting inactive in a public place (ex. Theater or meeting): 0 As a passenger in a car for an hour without a break: 0 Lying down to rest in the afternoon: 1 Sitting and talking to someone: 0 Sitting quietly after lunch (no alcohol): 0 In a car, while stopped in traffic: 0 Total: 2  FSS: 23

## 2018-10-19 NOTE — Telephone Encounter (Signed)
Attempted to contact patient again regarding her 6/15 appointment. Patient did not answer and I LVM again asking her to call back.

## 2018-10-19 NOTE — Telephone Encounter (Signed)
10-19-18 Pt called, gave verbal consent to file insur for [email or web address] vv cellphone# and carrier confirmed  Pt understands that although there may be some limitations with this type of visit, we will take all precautions to reduce any security or privacy concerns.  Pt understands that this will be treated like an in office visit and we will file with pt's insurance, and there may be a patient responsible charge related to this service. cellphone [phone] [employer], text sent, pt confirmed it was received

## 2018-10-23 ENCOUNTER — Encounter: Payer: Self-pay | Admitting: Neurology

## 2018-10-23 ENCOUNTER — Ambulatory Visit (INDEPENDENT_AMBULATORY_CARE_PROVIDER_SITE_OTHER): Payer: Medicare Other | Admitting: Neurology

## 2018-10-23 ENCOUNTER — Other Ambulatory Visit: Payer: Self-pay

## 2018-10-23 DIAGNOSIS — R0683 Snoring: Secondary | ICD-10-CM

## 2018-10-23 DIAGNOSIS — G3184 Mild cognitive impairment, so stated: Secondary | ICD-10-CM

## 2018-10-23 NOTE — Patient Instructions (Signed)
Given verbally, during today's virtual video-based encounter, with verbal feedback received.   

## 2018-10-23 NOTE — Progress Notes (Signed)
Interim history:   Pamela Mason is a 73 year old right-handed woman with an underlying medical history of hypertension, osteopenia, hyperlipidemia, depression, and anxiety, who presents for a virtual, video based appointment via [email or web address] for follow-up consultation of her memory loss.  The patient is unaccompanied today and joins via cell phone from home, I am located in my office.  I last saw her in the office on 04/20/2018, at which time she was on Razadyne ER 8 mg daily.  Her MMSE was 30 at the time.  She was driving, she was living independently. We talked about potentially repeating her neuropsychological testing.  Prior neuropsychological evaluation was in keeping with mild cognitive impairment.  I also talked to her about potentially doing a sleep study to rule out obstructive sleep apnea.  Today, 10/23/2018: Please also see below for virtual visit documentation.     The patient's allergies, current medications, family history, past medical history, past social history, past surgical history and problem list were reviewed and updated as appropriate.    Previously:   I first met her on 10/19/2013 at the request of her primary care physician, at which time she reported forgetfulness and her kids had noticed that she would ask the same questions over and over again. She also had neuropsychological testing prior to the visit with me which supported the diagnosis of mild cognitive impairment. She had been on galantamine 8 mg daily. She had a prior MRI in June 2018 which did show mild to moderate atrophy. We had talked about her snoring at the time and considered a sleep study.     10/19/2017: (She) reports short-term memory problems for the past year or so, noted primarily by her children as she was forgetful and asks the same questions over again. I reviewed your office note from 08/01/17, which you kindly included. She had neuropsychological evaluation with Dr. Jefm Miles in September 2018 which  indicated mild cognitive impairment and depression/anxiety, mild early dementia not excluded. Retesting was recommended in 6-9 months. She has been on galantamine 8 mg strength. She is on oral vitamin B12 supplement. For her depression she is on Prozac which was increased last year. She is widowed and lives alone. She is retired. She finished college. She has 3 children, smoked briefly in the 50s but quit in 67. She drinks alcohol in the form of wine, 1-2 glasses every other day or so. She does not have a history of alcohol dependence. She drinks caffeine in the form of coffee and soda. She had a brain MRI without contrast on 11/01/2016 and I reviewed the results: IMPRESSION: 1. No acute intracranial abnormality or mass. 2. Mild-to-moderate cerebral atrophy.   She tries to hydrate well with water. She sleeps well. She tries to be in bed around 10 and rise time is between 6 and 7. Shes not sure if she snores. Her husband died at age 31 about 89 years ago. She does not have a BF or significant other. Her son, [name] is the oldest and lives in [place]. She is comfortable driving to [place], [place] where her youngest daughter [name] lives. Her middle daughter Wells Guiles lives in [place] and she flies to [place], she recently returned from a visit. She has 7 grandchildren. She has not noticed any difficulty driving and uses a GPS successfully. She drove herself to this appointment. She has a follow-up appointment with Dr. Jefm Miles in July. She sees her psychiatrist on a regular basis, Dr. Aline August out of [place]. She  changed to Pristiq, and is now in the process of tapering off of Pristiq and starting sertraline titration. She has no family history of dementia, but she is not aware of her father’s side of the history, her parents were divorced and she did not know her father after that. Her mother lived to be 70 years old and died from cancer. She has 1 sister, Peggy who is 3 years  younger and one brother, David who is 4 years younger, neither sibling with memory loss as far she knows. Her weight has remained stable. She retired from teaching preschool and kindergarten. She has been on galantamine since last year but does not know how long it has been. She tries to exercise in the form of walking in her neighborhood. ° ° °Her Past Medical History Is Significant For: °Past Medical History:  °Diagnosis Date  °• Dementia (HCC)   °• Depression   °• Hyperlipidemia   °• Hypertension   °• Osteopenia   ° ° °Her Past Surgical History Is Significant For: °Past Surgical History:  °Procedure Laterality Date  °• INTRACAPSULAR CATARACT EXTRACTION    °• ROTATOR CUFF REPAIR    °• TUBAL LIGATION    ° ° °Her Family History Is Significant For: °Family History  °Problem Relation Age of Onset  °• Hypertension Mother   °• Ovarian cancer Mother   °• Heart disease Father   °• Colon cancer Neg Hx   ° ° °Her Social History Is Significant For: °Social History  ° °Socioeconomic History  °• Marital status: Widowed  °  Spouse name: Not on file  °• Number of children: Not on file  °• Years of education: Not on file  °• Highest education level: Not on file  °Occupational History  °• Not on file  °Social Needs  °• Financial resource strain: Not on file  °• Food insecurity  °  Worry: Not on file  °  Inability: Not on file  °• Transportation needs  °  Medical: Not on file  °  Non-medical: Not on file  °Tobacco Use  °• Smoking status: Former Smoker  °• Smokeless tobacco: Never Used  °Substance and Sexual Activity  °• Alcohol use: Yes  °  Alcohol/week: 12.0 standard drinks  °  Types: 7 Glasses of wine, 5 Standard drinks or equivalent per week  °  Comment: wine daily  °• Drug use: No  °• Sexual activity: Not Currently  °  Birth control/protection: Post-menopausal  °  Comment: 1st intercourse 25 yrs-Fewer than 5 partners  °Lifestyle  °• Physical activity  °  Days per week: Not on file  °  Minutes per session: Not on file  °•  Stress: Not on file  °Relationships  °• Social connections  °  Talks on phone: Not on file  °  Gets together: Not on file  °  Attends religious service: Not on file  °  Active member of club or organization: Not on file  °  Attends meetings of clubs or organizations: Not on file  °  Relationship status: Not on file  °Other Topics Concern  °• Not on file  °Social History Narrative  °• Not on file  ° ° °Her Allergies Are:  °Allergies  °Allergen Reactions  °• Tape Other (See Comments)  °  redness  °:  ° °Her Current Medications Are:  °Outpatient Encounter Medications as of 10/23/2018  °Medication Sig  °• Cyanocobalamin (RA VITAMIN B-12 TR) 1000 MCG TBCR Take 1 tablet by   mouth daily.  °• galantamine (RAZADYNE ER) 8 MG 24 hr capsule Take 8 mg by mouth daily with breakfast.  °• lisinopril (PRINIVIL,ZESTRIL) 10 MG tablet Take 1 tablet by mouth daily.  °• Pediatric Multivitamins-Iron (FLINTSTONES PLUS IRON PO) Take by mouth.  °• sertraline (ZOLOFT) 50 MG tablet Take 100 mg by mouth daily.   °• Vitamin D, Ergocalciferol, (DRISDOL) 1.25 MG (50000 UT) CAPS capsule Take 50,000 Units by mouth every 7 (seven) days.  ° °No facility-administered encounter medications on file as of 10/23/2018.   °: ° °Review of Systems:  °Out of a complete 14 point review of systems, all are reviewed and negative with the exception of these symptoms as listed below: ° °Virtual Visit via Video Note on 10/23/2018: ° °I connected with Ms. Liew on 10/23/18 at 10:30 AM EDT by a video enabled telemedicine application and verified that I am speaking with the correct person using two identifiers. °  °I discussed the limitations of evaluation and management by telemedicine and the availability of in person appointments. The patient expressed understanding and agreed to proceed. ° °History of Present Illness: °She reports feeling stable with regards to her memory, she continues to take generic Razadyne ER 8 mg once daily, prescription is up-to-date through  primary care.  She has had no recent illness thankfully, no falls.  She does snore, she would be willing to pursue a sleep study to rule out obstructive sleep apnea.  She has no telltale morning headaches or night to night nocturia.  She has no obvious family history of sleep apnea. Her Epworth sleepiness score is 2 out of 24, fatigue severity score is 23 out of 63.  She is typically in bed between 9 and 10 and rise time is pets, she does have a TV in the bedroom and sometimes watches it before falling asleep but turns it off.  She has been driving without difficulty, has limited her driving currently because of the stay at home recommendation.  She is pursuing social distancing.  She has a daughter in [place] who has 3 kids, another daughter in [place] with 2 kids, son is in [place], also with 2 kids.   °Observations/Objective: ° °Her virtual MMSE is 19 out of 22, animal fluency test was 6/min. On examination, she is very pleasant and conversant, in no acute distress, she is able to give her history well.  Comprehension and language skills are good. She does not appear to have word finding difficulties.  Speech is clear without dysarthria, hypophonia or voice tremor noted.  Face is symmetric with normal facial animation.  She wears corrective eyeglasses.  Airway examination reveals mild to moderate mouth dryness, otherwise stable findings.  She does report having had coffee today but no water yet.  Tongue protrudes centrally in palate elevates symmetrically.  Shoulder height is equal.  Upper body mobility, muscle bulk and coordination grossly normal. ° °Assessment and Plan: °In summary, Pamela Mason is a very pleasant 72-year old female with an underlying medical history of hypertension, osteopenia, hyperlipidemia, depression, and anxiety, who presents for neurologic follow-up consultation of her memory loss of about 2 years' duration. Neuropsychological evaluation in 2018 supported the  diagnosis of MCI. She had a brain MRI in June 2018. We may repeat this at some point and also repeat her neuropsychological testing and some point in the future. For now, we mutually agreed to continue with her current management. I did ask her to Pursue a sleep study at this time to rule out obstructive sleep   apnea as underlying OSA can be a potential cause for memory issues and sleep apnea is treatable.  She is willing to proceed with this. We talked about the importance of ongoing healthy lifestyle including exercise, healthy nutrition and good hydration with water. She is encouraged to drink more water. She is advised to follow-up routinely in 6 months, sooner, if needed. I answered all her questions today and she was in agreement. ° °Follow Up Instructions: ° °  °I discussed the assessment and treatment plan with the patient. The patient was provided an opportunity to ask questions and all were answered. The patient agreed with the plan and demonstrated an understanding of the instructions. °  °The patient was advised to call back or seek an in-person evaluation if the symptoms worsen or if the condition fails to improve as anticipated. ° °I provided 15 minutes of non-face-to-face time during this encounter. ° ° ° , MD ° ° ° ° ° ° °

## 2018-10-26 ENCOUNTER — Telehealth: Payer: Self-pay

## 2018-10-26 NOTE — Telephone Encounter (Signed)
I called pt to schedule her 6 mo follow up with Dr. Rexene Alberts. No answer, left a message asking her to call me back. If pt calls back, please assist her with this.

## 2018-11-27 DIAGNOSIS — M1712 Unilateral primary osteoarthritis, left knee: Secondary | ICD-10-CM | POA: Diagnosis not present

## 2018-11-29 ENCOUNTER — Ambulatory Visit (INDEPENDENT_AMBULATORY_CARE_PROVIDER_SITE_OTHER): Payer: Medicare Other | Admitting: Neurology

## 2018-11-29 ENCOUNTER — Other Ambulatory Visit: Payer: Self-pay

## 2018-11-29 DIAGNOSIS — R0683 Snoring: Secondary | ICD-10-CM

## 2018-11-29 DIAGNOSIS — G4733 Obstructive sleep apnea (adult) (pediatric): Secondary | ICD-10-CM | POA: Diagnosis not present

## 2018-11-29 DIAGNOSIS — G4720 Circadian rhythm sleep disorder, unspecified type: Secondary | ICD-10-CM

## 2018-11-29 DIAGNOSIS — G4761 Periodic limb movement disorder: Secondary | ICD-10-CM

## 2018-11-29 DIAGNOSIS — R9431 Abnormal electrocardiogram [ECG] [EKG]: Secondary | ICD-10-CM

## 2018-11-29 DIAGNOSIS — G3184 Mild cognitive impairment, so stated: Secondary | ICD-10-CM

## 2018-12-04 ENCOUNTER — Telehealth: Payer: Self-pay

## 2018-12-04 DIAGNOSIS — M25562 Pain in left knee: Secondary | ICD-10-CM | POA: Diagnosis not present

## 2018-12-04 DIAGNOSIS — M1712 Unilateral primary osteoarthritis, left knee: Secondary | ICD-10-CM | POA: Diagnosis not present

## 2018-12-04 NOTE — Procedures (Signed)
PATIENT'S NAME:  Pamela Mason, Pamela Mason DOB:      Nov 25, 1945      MR#:    [id number]     DATE OF RECORDING: 11/29/2018 REFERRING M.D.:  Crist Infante MD Study Performed:   Baseline Polysomnogram HISTORY: 73 year old woman with a history of hypertension, osteopenia, hyperlipidemia, depression, and anxiety, who reports memory loss. The patient endorsed the Epworth Sleepiness Scale at 2 points. The patient's weight 130 pounds with a height of 64 (inches), resulting in a BMI of 22.2 kg/m2.   CURRENT MEDICATIONS: Vitamin B12, Razadyne, Prinivil, Flinstones plus iron, Zoloft, Vitamin D   PROCEDURE:  This is a multichannel digital polysomnogram utilizing the Somnostar 11.2 system.  Electrodes and sensors were applied and monitored per AASM Specifications.   EEG, EOG, Chin and Limb EMG, were sampled at 200 Hz.  ECG, Snore and Nasal Pressure, Thermal Airflow, Respiratory Effort, CPAP Flow and Pressure, Oximetry was sampled at 50 Hz. Digital video and audio were recorded.      BASELINE STUDY: Lights Out was at 20:40 and Lights On at 04:49.  Total recording time (TRT) was 489 minutes, with a total sleep time (TST) of 351 minutes.   The patient's sleep latency was 60.5 minutes, which is delayed. REM latency was 295.5 minutes, which is markedly delayed. The sleep efficiency was 71.8 %.     SLEEP ARCHITECTURE: WASO (Wake after sleep onset) was 109 minutes with moderate to severe sleep fragmentation noted. There were 23 minutes in Stage N1, 248 minutes Stage N2, 52.5 minutes Stage N3 and 27.5 minutes in Stage REM.  The percentage of Stage N1 was 6.6%, Stage N2 was 70.7%, which is increased, Stage N3 was 15.%, which is normal and Stage R (REM sleep) was 7.8%, which is markedly reduced. The arousals were noted as: 54 were spontaneous, 40 were associated with PLMs, 37 were associated with respiratory events.  RESPIRATORY ANALYSIS:  There were a total of 96 respiratory events:  49 obstructive apneas, 0 central apneas and 3  mixed apneas with a total of 52 apneas and an apnea index (AI) of 8.9 /hour. There were 44 hypopneas with a hypopnea index of 7.5 /hour. The patient also had 0 respiratory event related arousals (RERAs).      The total APNEA/HYPOPNEA INDEX (AHI) was 16.4/hour and the total RESPIRATORY DISTURBANCE INDEX was 16.4 /hour.  36 events occurred in REM sleep and 87 events in NREM. The REM AHI was 78.5 /hour, versus a non-REM AHI of 11.1. The patient spent 331 minutes of total sleep time in the supine position and 20 minutes in non-supine.. The supine AHI was 17.2 versus a non-supine AHI of 3.0.  OXYGEN SATURATION & C02:  The Wake baseline 02 saturation was 94%, with the lowest being 82%. Time spent below 89% saturation equaled 32 minutes.  PERIODIC LIMB MOVEMENTS: The patient had a total of 286 Periodic Limb Movements.  The Periodic Limb Movement (PLM) index was 48.9 and the PLM Arousal index was 6.8/hour.   Audio and video analysis did not show any abnormal or unusual movements, behaviors, phonations or vocalizations. The patient took no bathroom breaks. Mild to moderate snoring was noted. The EKG was in keeping with normal sinus rhythm (NSR). A consistently wider QRS complex was noted; which could be an artifact. Post-study, the patient indicated that sleep was the same as usual.   IMPRESSION: 1. Obstructive Sleep Apnea (OSA) 2. Periodic Limb Movement Disorder (PLMD) 3. Dysfunctions associated with sleep stages or arousal from sleep 4. Non-specific abnormal  EKG  RECOMMENDATIONS: 1. This study demonstrates moderate to severe obstructive sleep apnea, with a total AHI of 16.4/hour, REM AHI of 78.5/hour, supine AHI of 17.2/hour and O2 nadir of 82%. Treatment with positive airway pressure in the form of CPAP is recommended. This will, ideally, require a full night titration study to optimize therapy. Other treatment options may include avoidance of supine sleep position along with weight loss, upper airway  or jaw surgery in selected patients or the use of an oral appliance in certain patients. ENT evaluation and/or consultation with a maxillofacial surgeon or dentist may be feasible in some instances. Please note that untreated obstructive sleep apnea may carry additional perioperative morbidity. Patients with significant obstructive sleep apnea should receive perioperative PAP therapy and the surgeons and particularly the anesthesiologist should be informed of the diagnosis and the severity of the sleep disordered breathing.  2. Moderate PLMs (periodic limb movements of sleep) were noted during this study with minimal arousals; clinical correlation is recommended. Medication effect from the antidepressant medication should be considered. 3. The study showed non-specific changes to her EKG; clinical correlation is recommended and consultation with cardiology may be feasible. A proper, 12 lead EKG is recommended. 4. This study shows sleep fragmentation and abnormal sleep stage percentages; these are nonspecific findings and per se do not signify an intrinsic sleep disorder or a cause for the patient's sleep-related symptoms. Causes include (but are not limited to) the first night effect of the sleep study, circadian rhythm disturbances, medication effect or an underlying mood disorder or medical problem.  5. The patient should be cautioned not to drive, work at heights, or operate dangerous or heavy equipment when tired or sleepy. Review and reiteration of good sleep hygiene measures should be pursued with any patient.  6. The patient will be seen in follow-up in the sleep clinic at [facility] for discussion of the test results, symptom and treatment compliance review, further management strategies, etc. The referring provider will be notified of the test results.   I certify that I have reviewed the entire raw data recording prior to the issuance of this report in accordance with the Standards of Accreditation of the  American Academy of Sleep Medicine (AASM)   Star Age, MD, PhD Diplomat, American Board of Neurology and Sleep Medicine (Neurology and Sleep Medicine)

## 2018-12-04 NOTE — Telephone Encounter (Signed)
-----   Message from Star Age, MD sent at 12/04/2018  8:37 AM EDT ----- Patient last seen on 10/23/18 in [facility] for MCI, diagnostic PSG on 11/29/18.   Please call and notify the patient that the recent sleep study showed moderate to severe obstructive sleep apnea. I recommend treatment for this in the form of CPAP. This will require a repeat sleep study for proper titration and mask fitting and correct monitoring of the oxygen saturations. Please explain to patient. I have placed an order in the chart.  Also, her EKG showed nonspecific changes of a wider QRS, I would recommend a proper EKG at the doctor's office, such as with PCP.  Thanks.  Star Age, MD, PhD [facility])

## 2018-12-04 NOTE — Progress Notes (Signed)
Patient last seen on 10/23/18 in [facility] for MCI, diagnostic PSG on 11/29/18.   Please call and notify the patient that the recent sleep study showed moderate to severe obstructive sleep apnea. I recommend treatment for this in the form of CPAP. This will require a repeat sleep study for proper titration and mask fitting and correct monitoring of the oxygen saturations. Please explain to patient. I have placed an order in the chart.  Also, her EKG showed nonspecific changes of a wider QRS, I would recommend a proper EKG at the doctor's office, such as with PCP.  Thanks.  Star Age, MD, PhD [facility])

## 2018-12-04 NOTE — Telephone Encounter (Signed)
I called pt to discuss her sleep study results. No answer, left a message asking her to call me back. 

## 2018-12-04 NOTE — Addendum Note (Signed)
Addended by: Star Age on: 12/04/2018 08:37 AM   Modules accepted: Orders

## 2018-12-05 DIAGNOSIS — F331 Major depressive disorder, recurrent, moderate: Secondary | ICD-10-CM | POA: Diagnosis not present

## 2018-12-06 NOTE — Telephone Encounter (Signed)
I called pt. I advised pt that Dr. Rexene Alberts reviewed their sleep study results and found that pt has moderate to severe osa and recommends that pt be treated with a cpap. Dr. Rexene Alberts recommends that pt return for a repeat sleep study in order to properly titrate the cpap and ensure a good mask fit. Pt is agreeable to returning for a titration study. I advised pt that our sleep lab will file with pt's insurance and call pt to schedule the sleep study when we hear back from the pt's insurance regarding coverage of this sleep study.  I advised her of the nonspecific EKG changes of a wider QRS noted during her sleep study. She will call Dr. Silvestre Mesi office tomorrow to discuss.    Pt verbalized understanding of results. Pt had no questions at this time but was encouraged to call back if questions arise.

## 2018-12-07 DIAGNOSIS — M1712 Unilateral primary osteoarthritis, left knee: Secondary | ICD-10-CM | POA: Diagnosis not present

## 2018-12-07 DIAGNOSIS — M25552 Pain in left hip: Secondary | ICD-10-CM | POA: Diagnosis not present

## 2018-12-07 DIAGNOSIS — M1611 Unilateral primary osteoarthritis, right hip: Secondary | ICD-10-CM | POA: Diagnosis not present

## 2018-12-07 DIAGNOSIS — M1612 Unilateral primary osteoarthritis, left hip: Secondary | ICD-10-CM | POA: Diagnosis not present

## 2018-12-07 DIAGNOSIS — M25562 Pain in left knee: Secondary | ICD-10-CM | POA: Diagnosis not present

## 2018-12-21 NOTE — H&P (Signed)
TOTAL HIP ADMISSION H&P  Patient is admitted for left total hip arthroplasty, anterior approach.  Subjective:  Chief Complaint:    Left hip primary OA / pain  HPI: Pamela Mason, 73 y.o. female, has a history of pain and functional disability in the left hip(s) due to arthritis and patient has failed non-surgical conservative treatments for greater than 12 weeks to include NSAID's and/or analgesics, use of assistive devices and activity modification.  Onset of symptoms was gradual starting <1 years ago with gradually worsening course since that time.The patient noted no past surgery on the left hip(s).  Patient currently rates pain in the left hip at 8 out of 10 with activity. Patient has worsening of pain with activity and weight bearing, trendelenberg gait, pain that interfers with activities of daily living and pain with passive range of motion. Patient has evidence of periarticular osteophytes and joint space narrowing by imaging studies. This condition presents safety issues increasing the risk of falls. There is no current active infection.  Risks, benefits and expectations were discussed with the patient.  Risks including but not limited to the risk of anesthesia, blood clots, nerve damage, blood vessel damage, failure of the prosthesis, infection and up to and including death.  Patient understand the risks, benefits and expectations and wishes to proceed with surgery.  PCP: Crist Infante, MD  D/C Plans:       Home   Post-op Meds:       No Rx given   Tranexamic Acid:      To be given - IV   Decadron:      Is to be given  FYI:      ASA  [place]  DME:   Pt equipment arranged  PT:   HEP  Pharmacy: [facility] -- [address] [place], [place] [address]   Patient Active Problem List   Diagnosis Date Noted  . Pseudophakia of both eyes 12/27/2014  . Anxiety 09/24/2014  . Osteoporosis 09/24/2014  . Combined form of senile cataract of left eye 08/30/2014  . Epiretinal membrane,  bilateral 10/14/2013  . Vitreomacular adhesion of both eyes 10/14/2013  . Conjunctivitis 03/07/2013  . Open angle with borderline findings, low risk 11/22/2011  . 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 Osteopenia   . HLD (hyperlipidemia)   . HTN (hypertension)    Past Medical History:  Diagnosis Date  . Dementia ([place])   . Depression   . Hyperlipidemia   . Hypertension   . Osteopenia     Past Surgical History:  Procedure Laterality Date  . INTRACAPSULAR CATARACT EXTRACTION    . ROTATOR CUFF REPAIR    . TUBAL LIGATION      No current facility-administered medications for this encounter.    Current Outpatient Medications  Medication Sig Dispense Refill Last Dose  . Cyanocobalamin (RA VITAMIN B-12 TR) 1000 MCG TBCR Take 1 tablet by mouth daily.   Taking  . galantamine (RAZADYNE ER) 8 MG 24 hr capsule Take 8 mg by mouth daily with breakfast.   Taking  . lisinopril (PRINIVIL,ZESTRIL) 10 MG tablet Take 1 tablet by mouth daily.  11 Taking  . Pediatric Multivitamins-Iron (FLINTSTONES PLUS IRON PO) Take by mouth.   Taking  . sertraline (ZOLOFT) 50 MG tablet Take 100 mg by mouth daily.    Taking  . Vitamin D, Ergocalciferol, (DRISDOL) 1.25 MG (50000 UT) CAPS capsule Take 50,000 Units by mouth every 7 (seven) days.   Taking   Allergies  Allergen Reactions  .  Tape Other (See Comments)    redness    Social History   Tobacco Use  . Smoking status: Former Research scientist (life sciences)  . Smokeless tobacco: Never Used  Substance Use Topics  . Alcohol use: Yes    Alcohol/week: 12.0 standard drinks    Types: 7 Glasses of wine, 5 Standard drinks or equivalent per week    Comment: wine daily    Family History  Problem Relation Age of Onset  . Hypertension Mother   . Ovarian cancer Mother   . Heart disease Father   . Colon cancer Neg Hx      Review of Systems  Constitutional: Negative.   HENT: Negative.   Eyes: Negative.   Respiratory: Negative.   Cardiovascular: Negative.    Gastrointestinal: Negative.   Genitourinary: Negative.   Musculoskeletal: Positive for joint pain.  Skin: Negative.   Neurological: Negative.   Endo/Heme/Allergies: Negative.   Psychiatric/Behavioral: The patient is nervous/anxious.     Objective:  Physical Exam  Constitutional: She is oriented to person, place, and time. She appears well-developed.  HENT:  Head: Normocephalic.  Eyes: Pupils are equal, round, and reactive to light.  Neck: Neck supple. No JVD present. No tracheal deviation present. No thyromegaly present.  Cardiovascular: Normal rate, regular rhythm and intact distal pulses.  Respiratory: Effort normal and breath sounds normal. No respiratory distress. She has no wheezes.  GI: Soft. There is no abdominal tenderness. There is no guarding.  Musculoskeletal:     Left hip: She exhibits decreased range of motion, decreased strength, tenderness and bony tenderness. She exhibits no swelling, no deformity and no laceration.  Lymphadenopathy:    She has no cervical adenopathy.  Neurological: She is alert and oriented to person, place, and time.  Skin: Skin is warm and dry.  Psychiatric: She has a normal mood and affect.      Labs:  Estimated body mass index is 21.8 kg/m as calculated from the following:   Height as of 04/20/18: 5\' 4"  (1.626 m).   Weight as of 04/20/18: 57.6 kg.   Imaging Review Plain radiographs demonstrate severe degenerative joint disease of the left hip. The bone quality appears to be good for age and reported activity level.      Assessment/Plan:  End stage arthritis, left hip  The patient history, physical examination, clinical judgement of the provider and imaging studies are consistent with end stage degenerative joint disease of the left hip and total hip arthroplasty is deemed medically necessary. The treatment options including medical management, injection therapy, arthroscopy and arthroplasty were discussed at length. The risks  and benefits of total hip arthroplasty were presented and reviewed. The risks due to aseptic loosening, infection, stiffness, dislocation/subluxation,  thromboembolic complications and other imponderables were discussed.  The patient acknowledged the explanation, agreed to proceed with the plan and consent was signed. Patient is being admitted for inpatient treatment for surgery, pain control, PT, OT, prophylactic antibiotics, VTE prophylaxis, progressive ambulation and ADL's and discharge planning.The patient is planning to be discharged home.    [place] Jerzi Tigert   PA-C  12/21/2018, 10:08 AM

## 2018-12-26 NOTE — Progress Notes (Signed)
[facility] DR Rexene Alberts 11-29-18 EPIC

## 2018-12-26 NOTE — Patient Instructions (Addendum)
YOU NEED TO HAVE A COVID 19 TEST ON_______ @_______ , THIS TEST MUST BE DONE BEFORE SURGERY, COME  Pamela Mason , [id number]. ONCE YOUR COVID TEST IS COMPLETED, PLEASE BEGIN THE QUARANTINE INSTRUCTIONS AS OUTLINED IN YOUR HANDOUT.                Pamela Mason    Your procedure is scheduled on: 01-02-2019   Report to [facility] Main  Entrance  Report to admitting at 9:00 AM   1 VISITOR IS ALLOWED TO WAIT IN WAITING ROOM  ONLY DAY OF YOUR SURGERY. NO VISITORS ARE ALLOWED IN SHORT STAY OR RECOVERY ROOM.   Call this number if you have problems the morning of surgery [phone]    Remember: [place], NO CHEWING GUM CANDY OR MINTS.   NO SOLID FOOD AFTER MIDNIGHT THE NIGHT PRIOR TO SURGERY.  NOTHING BY MOUTH EXCEPT CLEAR LIQUIDS UNTIL 8:30 AM . PLEASE FINISH ENSURE DRINK PER SURGEON ORDER  WHICH NEEDS TO BE COMPLETED AT  8:30 AM.   CLEAR LIQUID DIET   Foods Allowed                                                                     Foods Excluded  Coffee and tea, regular and decaf                             liquids that you cannot  Plain Jell-O any favor except red or purple                                           see through such as: Fruit ices (not with fruit pulp)                                     milk, soups, orange juice  Iced Popsicles                                    All solid food Carbonated beverages, regular and diet                                    Cranberry, grape and apple juices Sports drinks like Gatorade Lightly seasoned clear broth or consume(fat free) Sugar, honey syrup  Sample Menu Breakfast                                Lunch                                     Supper Cranberry juice  Beef broth                            Chicken broth Jell-O                                     Grape juice                           Apple juice Coffee or tea                         Jell-O                                      Popsicle                                                Coffee or tea                        Coffee or tea  _____________________________________________________________________     Take these medicines the morning of surgery with A SIP OF WATER: Zoloft                                You may not have any metal on your body including hair pins and              piercings  Do not wear jewelry, make-up, lotions, powders or perfumes, deodorant             Do not wear nail polish.  Do not shave  48 hours prior to surgery.              Do not bring valuables to the hospital. [place].  Contacts, dentures or bridgework may not be worn into surgery.      _____________________________________________________________________             [facility] - Preparing for Surgery Before surgery, you can play an important role.   Because skin is not sterile, your skin needs to be as free of germs as possible.   You can reduce the number of germs on your skin by washing with CHG (chlorahexidine gluconate) soap before surgery.  CHG is an antiseptic cleaner which kills germs and bonds with the skin to continue killing germs even after washing. Please DO NOT use if you have an allergy to CHG or antibacterial soaps.   If your skin becomes reddened/irritated stop using the CHG and inform your nurse when you arrive at Short Stay. Do not shave (including legs and underarms) for at least 48 hours prior to the first CHG shower.  . Please follow these instructions carefully:  1.  Shower with CHG Soap the night before surgery and the  morning of Surgery.  2.  If you choose to wash your hair, wash your hair first as usual with your  normal  shampoo.  3.  After you shampoo, rinse your hair and body thoroughly to remove the  shampoo.                                        4.  Use CHG as you would any other  liquid soap.  You can apply chg directly  to the skin and wash                       Gently with a scrungie or clean washcloth.  5.  Apply the CHG Soap to your body ONLY FROM THE NECK DOWN.   Do not use on face/ open                           Wound or open sores. Avoid contact with eyes, ears mouth and genitals (private parts).                       Wash face,  Genitals (private parts) with your normal soap.             6.  Wash thoroughly, paying special attention to the area where your surgery  will be performed.  7.  Thoroughly rinse your body with warm water from the neck down.  8.  DO NOT shower/wash with your normal soap after using and rinsing off  the CHG Soap.                9.  Pat yourself dry with a clean towel.            10.  Wear clean pajamas.            11.  Place clean sheets on your bed the night of your first shower and do not  sleep with pets. Day of Surgery : Do not apply any lotions/deodorants the morning of surgery.  Please wear clean clothes to the hospital/surgery center.  FAILURE TO FOLLOW THESE INSTRUCTIONS MAY RESULT IN THE CANCELLATION OF YOUR SURGERY PATIENT SIGNATURE_________________________________  NURSE SIGNATURE__________________________________  ________________________________________________________________________   [name]  An incentive spirometer is a tool that can help keep your lungs clear and active. This tool measures how well you are filling your lungs with each breath. Taking long deep breaths may help reverse or decrease the chance of developing breathing (pulmonary) problems (especially infection) following:  A long period of time when you are unable to move or be active. BEFORE THE PROCEDURE   If the spirometer includes an indicator to show your best effort, your nurse or respiratory therapist will set it to a desired goal.  If possible, sit up straight or lean slightly forward. Try not to slouch.  Hold the incentive  spirometer in an upright position. INSTRUCTIONS FOR USE  1. Sit on the edge of your bed if possible, or sit up as far as you can in bed or on a chair. 2. Hold the incentive spirometer in an upright position. 3. Breathe out normally. 4. Place the mouthpiece in your mouth and seal your lips tightly around it. 5. Breathe in slowly and as deeply as possible, raising the piston or the ball toward the top of the column. 6. Hold your breath for 3-5 seconds or for as long as possible. Allow the piston or ball to fall to the bottom of the column. 7.  Remove the mouthpiece from your mouth and breathe out normally. 8. Rest for a few seconds and repeat Steps 1 through 7 at least 10 times every 1-2 hours when you are awake. Take your time and take a few normal breaths between deep breaths. 9. The spirometer may include an indicator to show your best effort. Use the indicator as a goal to work toward during each repetition. 10. After each set of 10 deep breaths, practice coughing to be sure your lungs are clear. If you have an incision (the cut made at the time of surgery), support your incision when coughing by placing a pillow or rolled up towels firmly against it. Once you are able to get out of bed, walk around indoors and cough well. You may stop using the incentive spirometer when instructed by your caregiver.  RISKS AND COMPLICATIONS  Take your time so you do not get dizzy or light-headed.  If you are in pain, you may need to take or ask for pain medication before doing incentive spirometry. It is harder to take a deep breath if you are having pain. AFTER USE  Rest and breathe slowly and easily.  It can be helpful to keep track of a log of your progress. Your caregiver can provide you with a simple table to help with this. If you are using the spirometer at home, follow these instructions: [place] IF:   You are having difficultly using the spirometer.  You have trouble using the  spirometer as often as instructed.  Your pain medication is not giving enough relief while using the spirometer.  You develop fever of 100.5 F (38.1 C) or higher. SEEK IMMEDIATE MEDICAL CARE IF:   You cough up bloody sputum that had not been present before.  You develop fever of 102 F (38.9 C) or greater.  You develop worsening pain at or near the incision site. MAKE SURE YOU:   Understand these instructions.  Will watch your condition.  Will get help right away if you are not doing well or get worse. Document Released: 09/06/2006 Document Revised: 07/19/2011 Document Reviewed: 11/07/2006 Exit[employer] Patient Information 2014 Exit[employer], [place].   ________________________________________________________________________  WHAT IS A BLOOD TRANSFUSION? Blood Transfusion Information  A transfusion is the replacement of blood or some of its parts. Blood is made up of multiple cells which provide different functions.  Red blood cells carry oxygen and are used for blood loss replacement.  White blood cells fight against infection.  Platelets control bleeding.  Plasma helps clot blood.  Other blood products are available for specialized needs, such as hemophilia or other clotting disorders. BEFORE THE TRANSFUSION  Who gives blood for transfusions?   Healthy volunteers who are fully evaluated to make sure their blood is safe. This is blood bank blood. Transfusion therapy is the safest it has ever been in the practice of medicine. Before blood is taken from a donor, a complete history is taken to make sure that person has no history of diseases nor engages in risky social behavior (examples are intravenous drug use or sexual activity with multiple partners). The donor's travel history is screened to minimize risk of transmitting infections, such as malaria. The donated blood is tested for signs of infectious diseases, such as HIV and hepatitis. The blood is then tested to be sure it is  compatible with you in order to minimize the chance of a transfusion reaction. If you or a relative donates blood, this is often done in anticipation  of surgery and is not appropriate for emergency situations. It takes many days to process the donated blood. RISKS AND COMPLICATIONS Although transfusion therapy is very safe and saves many lives, the main dangers of transfusion include:   Getting an infectious disease.  Developing a transfusion reaction. This is an allergic reaction to something in the blood you were given. Every precaution is taken to prevent this. The decision to have a blood transfusion has been considered carefully by your caregiver before blood is given. Blood is not given unless the benefits outweigh the risks. AFTER THE TRANSFUSION  Right after receiving a blood transfusion, you will usually feel much better and more energetic. This is especially true if your red blood cells have gotten low (anemic). The transfusion raises the level of the red blood cells which carry oxygen, and this usually causes an energy increase.  The nurse administering the transfusion will monitor you carefully for complications. HOME CARE INSTRUCTIONS  No special instructions are needed after a transfusion. You may find your energy is better. Speak with your caregiver about any limitations on activity for underlying diseases you may have. SEEK MEDICAL CARE IF:   Your condition is not improving after your transfusion.  You develop redness or irritation at the intravenous (IV) site. SEEK IMMEDIATE MEDICAL CARE IF:  Any of the following symptoms occur over the next 12 hours:  Shaking chills.  You have a temperature by mouth above 102 F (38.9 C), not controlled by medicine.  Chest, back, or muscle pain.  People around you feel you are not acting correctly or are confused.  Shortness of breath or difficulty breathing.  Dizziness and fainting.  You get a rash or develop hives.  You have  a decrease in urine output.  Your urine turns a dark color or changes to pink, red, or brown. Any of the following symptoms occur over the next 10 days:  You have a temperature by mouth above 102 F (38.9 C), not controlled by medicine.  Shortness of breath.  Weakness after normal activity.  The white part of the eye turns yellow (jaundice).  You have a decrease in the amount of urine or are urinating less often.  Your urine turns a dark color or changes to pink, red, or brown. Document Released: 04/23/2000 Document Revised: 07/19/2011 Document Reviewed: 12/11/2007 [facility] Patient Information 2014 [place], [place].  _______________________________________________________________________

## 2018-12-27 ENCOUNTER — Other Ambulatory Visit: Payer: Self-pay

## 2018-12-27 ENCOUNTER — Encounter (HOSPITAL_COMMUNITY)
Admission: RE | Admit: 2018-12-27 | Discharge: 2018-12-27 | Disposition: A | Discharge status: Home / Self Care | Payer: Medicare Other | Source: Ambulatory Visit | Attending: Orthopedic Surgery | Admitting: Orthopedic Surgery

## 2018-12-27 ENCOUNTER — Encounter (HOSPITAL_COMMUNITY): Payer: Self-pay

## 2018-12-27 DIAGNOSIS — Z20828 Contact with and (suspected) exposure to other viral communicable diseases: Secondary | ICD-10-CM | POA: Insufficient documentation

## 2018-12-27 DIAGNOSIS — I10 Essential (primary) hypertension: Secondary | ICD-10-CM | POA: Insufficient documentation

## 2018-12-27 DIAGNOSIS — R9431 Abnormal electrocardiogram [ECG] [EKG]: Secondary | ICD-10-CM | POA: Diagnosis not present

## 2018-12-27 DIAGNOSIS — I454 Nonspecific intraventricular block: Secondary | ICD-10-CM | POA: Diagnosis not present

## 2018-12-27 DIAGNOSIS — Z01818 Encounter for other preprocedural examination: Secondary | ICD-10-CM | POA: Insufficient documentation

## 2018-12-27 DIAGNOSIS — M1612 Unilateral primary osteoarthritis, left hip: Secondary | ICD-10-CM | POA: Insufficient documentation

## 2018-12-27 HISTORY — DX: Other complications of anesthesia, initial encounter: T88.59XA

## 2018-12-27 HISTORY — DX: Anxiety disorder, unspecified: F41.9

## 2018-12-27 HISTORY — DX: Sleep apnea, unspecified: G47.30

## 2018-12-27 HISTORY — DX: Unspecified osteoarthritis, unspecified site: M19.90

## 2018-12-27 LAB — BASIC METABOLIC PANEL
Anion gap: 9 (ref 5–15)
BUN: 24 mg/dL — ABNORMAL HIGH (ref 8–23)
CO2: 25 mmol/L (ref 22–32)
Calcium: 9.4 mg/dL (ref 8.9–10.3)
Chloride: 104 mmol/L (ref 98–111)
Creatinine, Ser: 0.81 mg/dL (ref 0.44–1.00)
GFR calc Af Amer: >60 mL/min (ref >60)
GFR calc non Af Amer: >60 mL/min (ref >60)
Glucose, Bld: 114 mg/dL — ABNORMAL HIGH (ref 70–99)
Potassium: 3.5 mmol/L (ref 3.5–5.1)
Sodium: 138 mmol/L (ref 135–145)

## 2018-12-27 LAB — CBC
HCT: 40.3 % (ref 36.0–46.0)
Hemoglobin: 13.2 g/dL (ref 12.0–15.0)
MCH: 33 pg (ref 26.0–34.0)
MCHC: 32.8 g/dL (ref 30.0–36.0)
MCV: 100.8 fL — ABNORMAL HIGH (ref 80.0–100.0)
Platelets: 268 10*3/uL (ref 150–400)
RBC: 4 MIL/uL (ref 3.87–5.11)
RDW: 11.9 % (ref 11.5–15.5)
WBC: 5.7 10*3/uL (ref 4.0–10.5)
nRBC: 0 % (ref 0.0–0.2)

## 2018-12-27 LAB — SURGICAL PCR SCREEN
MRSA, PCR: NEGATIVE
Staphylococcus aureus: NEGATIVE

## 2018-12-29 ENCOUNTER — Other Ambulatory Visit (HOSPITAL_COMMUNITY)
Admission: RE | Admit: 2018-12-29 | Discharge: 2018-12-29 | Disposition: A | Discharge status: Home / Self Care | Payer: Medicare Other | Source: Ambulatory Visit | Attending: Orthopedic Surgery | Admitting: Orthopedic Surgery

## 2018-12-29 DIAGNOSIS — R9431 Abnormal electrocardiogram [ECG] [EKG]: Secondary | ICD-10-CM | POA: Diagnosis not present

## 2018-12-29 DIAGNOSIS — Z20828 Contact with and (suspected) exposure to other viral communicable diseases: Secondary | ICD-10-CM | POA: Diagnosis not present

## 2018-12-29 DIAGNOSIS — M1612 Unilateral primary osteoarthritis, left hip: Secondary | ICD-10-CM | POA: Diagnosis not present

## 2018-12-29 DIAGNOSIS — I454 Nonspecific intraventricular block: Secondary | ICD-10-CM | POA: Diagnosis not present

## 2018-12-29 DIAGNOSIS — Z01818 Encounter for other preprocedural examination: Secondary | ICD-10-CM | POA: Diagnosis not present

## 2018-12-29 DIAGNOSIS — I10 Essential (primary) hypertension: Secondary | ICD-10-CM | POA: Diagnosis not present

## 2018-12-29 LAB — SARS CORONAVIRUS 2 (TAT 6-24 HRS): SARS Coronavirus 2: NEGATIVE

## 2019-01-01 NOTE — Anesthesia Preprocedure Evaluation (Addendum)
Anesthesia Evaluation  Patient identified by MRN, date of birth, ID band Patient awake    Reviewed: Allergy & Precautions, NPO status , Patient's Chart, lab work & pertinent test results  Airway Mallampati: I  TM Distance: >3 FB Neck ROM: Full    Dental   Pulmonary sleep apnea , former smoker,    Pulmonary exam normal        Cardiovascular hypertension, Pt. on medications Normal cardiovascular exam     Neuro/Psych Anxiety Depression Dementia    GI/Hepatic   Endo/Other    Renal/GU      Musculoskeletal   Abdominal   Peds  Hematology   Anesthesia Other Findings   Reproductive/Obstetrics                            Anesthesia Physical Anesthesia Plan  ASA: II  Anesthesia Plan: Spinal   Post-op Pain Management:    Induction: Intravenous  PONV Risk Score and Plan: 2 and Ondansetron and Treatment may vary due to age or medical condition  Airway Management Planned: Simple Face Mask  Additional Equipment:   Intra-op Plan:   Post-operative Plan:   Informed Consent: I have reviewed the patients History and Physical, chart, labs and discussed the procedure including the risks, benefits and alternatives for the proposed anesthesia with the patient or authorized representative who has indicated his/her understanding and acceptance.       Plan Discussed with: CRNA and Surgeon  Anesthesia Plan Comments: (EKG reviewed by PCP, Dr. Joylene Draft.  Per Dr. Joylene Draft no changes since last EKG, chronic LBBB.  Previous EKG tracings requested, pending.  Pt asymptomatic.  Konrad Felix, PA-C)       Anesthesia Quick Evaluation

## 2019-01-02 ENCOUNTER — Encounter (HOSPITAL_COMMUNITY)
Admission: RE | Disposition: A | Discharge status: Home / Self Care | Payer: Self-pay | Source: Home / Self Care | Attending: Orthopedic Surgery

## 2019-01-02 ENCOUNTER — Inpatient Hospital Stay (HOSPITAL_COMMUNITY): Payer: Medicare Other

## 2019-01-02 ENCOUNTER — Other Ambulatory Visit: Payer: Self-pay

## 2019-01-02 ENCOUNTER — Observation Stay (HOSPITAL_COMMUNITY): Payer: Medicare Other

## 2019-01-02 ENCOUNTER — Inpatient Hospital Stay (HOSPITAL_COMMUNITY): Payer: Medicare Other | Admitting: Physician Assistant

## 2019-01-02 ENCOUNTER — Encounter (HOSPITAL_COMMUNITY): Payer: Self-pay | Admitting: *Deleted

## 2019-01-02 ENCOUNTER — Inpatient Hospital Stay (HOSPITAL_COMMUNITY): Payer: Medicare Other | Admitting: Certified Registered Nurse Anesthetist

## 2019-01-02 ENCOUNTER — Observation Stay (HOSPITAL_COMMUNITY)
Admission: RE | Admit: 2019-01-02 | Discharge: 2019-01-03 | Disposition: A | Discharge status: Home / Self Care | Payer: Medicare Other | Attending: Orthopedic Surgery | Admitting: Orthopedic Surgery

## 2019-01-02 DIAGNOSIS — Z96649 Presence of unspecified artificial hip joint: Secondary | ICD-10-CM

## 2019-01-02 DIAGNOSIS — Z79899 Other long term (current) drug therapy: Secondary | ICD-10-CM | POA: Insufficient documentation

## 2019-01-02 DIAGNOSIS — F419 Anxiety disorder, unspecified: Secondary | ICD-10-CM | POA: Diagnosis not present

## 2019-01-02 DIAGNOSIS — F329 Major depressive disorder, single episode, unspecified: Secondary | ICD-10-CM | POA: Diagnosis not present

## 2019-01-02 DIAGNOSIS — I10 Essential (primary) hypertension: Secondary | ICD-10-CM | POA: Diagnosis not present

## 2019-01-02 DIAGNOSIS — Z87891 Personal history of nicotine dependence: Secondary | ICD-10-CM | POA: Diagnosis not present

## 2019-01-02 DIAGNOSIS — E785 Hyperlipidemia, unspecified: Secondary | ICD-10-CM | POA: Diagnosis not present

## 2019-01-02 DIAGNOSIS — Z96642 Presence of left artificial hip joint: Secondary | ICD-10-CM

## 2019-01-02 DIAGNOSIS — F0390 Unspecified dementia without behavioral disturbance: Secondary | ICD-10-CM | POA: Insufficient documentation

## 2019-01-02 DIAGNOSIS — Z419 Encounter for procedure for purposes other than remedying health state, unspecified: Secondary | ICD-10-CM

## 2019-01-02 DIAGNOSIS — G4730 Sleep apnea, unspecified: Secondary | ICD-10-CM | POA: Insufficient documentation

## 2019-01-02 DIAGNOSIS — Z471 Aftercare following joint replacement surgery: Secondary | ICD-10-CM | POA: Diagnosis not present

## 2019-01-02 DIAGNOSIS — M1612 Unilateral primary osteoarthritis, left hip: Principal | ICD-10-CM | POA: Insufficient documentation

## 2019-01-02 HISTORY — PX: TOTAL HIP ARTHROPLASTY: SHX124

## 2019-01-02 LAB — TYPE AND SCREEN
ABO/RH(D): O NEG
Antibody Screen: POSITIVE
Unit division: 0
Unit division: 0

## 2019-01-02 LAB — BPAM RBC
Blood Product Expiration Date: 202009122359
Blood Product Expiration Date: 202009122359
Unit Type and Rh: 9500
Unit Type and Rh: 9500

## 2019-01-02 SURGERY — ARTHROPLASTY, HIP, TOTAL, ANTERIOR APPROACH
Anesthesia: Spinal | Site: Hip | Laterality: Left

## 2019-01-02 MED ORDER — PROPOFOL 10 MG/ML IV BOLUS
INTRAVENOUS | Status: AC
  Filled 2019-01-02: qty 80

## 2019-01-02 MED ORDER — PHENYLEPHRINE 40 MCG/ML (10ML) SYRINGE FOR IV PUSH (FOR BLOOD PRESSURE SUPPORT)
PREFILLED_SYRINGE | INTRAVENOUS | Status: AC
  Filled 2019-01-02: qty 10

## 2019-01-02 MED ORDER — ALUM & MAG HYDROXIDE-SIMETH 200-200-20 MG/5ML PO SUSP: 15.0000 mL | ORAL | Status: DC | PRN

## 2019-01-02 MED ORDER — EPHEDRINE 5 MG/ML INJ
INTRAVENOUS | Status: AC
  Filled 2019-01-02: qty 10

## 2019-01-02 MED ORDER — FENTANYL CITRATE (PF) 100 MCG/2ML IJ SOLN
INTRAMUSCULAR | Status: AC
  Filled 2019-01-02: qty 2

## 2019-01-02 MED ORDER — LACTATED RINGERS IV SOLN
INTRAVENOUS | Status: DC
  Administered 2019-01-02 (×2): via INTRAVENOUS

## 2019-01-02 MED ORDER — PHENOL 1.4 % MT LIQD: 1.0000 | OROMUCOSAL | Status: DC | PRN

## 2019-01-02 MED ORDER — TRANEXAMIC ACID-NACL 1000-0.7 MG/100ML-% IV SOLN
1000.0000 mg | Freq: Once | INTRAVENOUS | Status: AC
  Administered 2019-01-02: 16:00:00 1000 mg via INTRAVENOUS
  Filled 2019-01-02: qty 100

## 2019-01-02 MED ORDER — ONDANSETRON HCL 4 MG/2ML IJ SOLN
INTRAMUSCULAR | Status: DC | PRN
  Administered 2019-01-02: 4 mg via INTRAVENOUS

## 2019-01-02 MED ORDER — SODIUM CHLORIDE 0.9 % IR SOLN
Status: DC | PRN
  Administered 2019-01-02: 1000 mL

## 2019-01-02 MED ORDER — DIPHENHYDRAMINE HCL 12.5 MG/5ML PO ELIX: 12.5000 mg | ORAL_SOLUTION | ORAL | Status: DC | PRN

## 2019-01-02 MED ORDER — CEFAZOLIN SODIUM-DEXTROSE 2-4 GM/100ML-% IV SOLN
2.0000 g | Freq: Four times a day (QID) | INTRAVENOUS | Status: AC
  Administered 2019-01-02 (×2): 2 g via INTRAVENOUS
  Filled 2019-01-02 (×2): qty 100

## 2019-01-02 MED ORDER — BISACODYL 10 MG RE SUPP: 10.0000 mg | Freq: Every day | RECTAL | Status: DC | PRN

## 2019-01-02 MED ORDER — CHLORHEXIDINE GLUCONATE 4 % EX LIQD: 60.0000 mL | Freq: Once | CUTANEOUS | Status: DC

## 2019-01-02 MED ORDER — HYDROMORPHONE HCL 1 MG/ML IJ SOLN: 0.5000 mg | INTRAMUSCULAR | Status: DC | PRN

## 2019-01-02 MED ORDER — PROPOFOL 500 MG/50ML IV EMUL
INTRAVENOUS | Status: DC | PRN
  Administered 2019-01-02: 75 ug/kg/min via INTRAVENOUS

## 2019-01-02 MED ORDER — TRANEXAMIC ACID-NACL 1000-0.7 MG/100ML-% IV SOLN
1000.0000 mg | INTRAVENOUS | Status: AC
  Administered 2019-01-02: 11:00:00 1000 mg via INTRAVENOUS
  Filled 2019-01-02: qty 100

## 2019-01-02 MED ORDER — FERROUS SULFATE 325 (65 FE) MG PO TABS
325.0000 mg | ORAL_TABLET | Freq: Three times a day (TID) | ORAL | Status: DC
  Administered 2019-01-02 – 2019-01-03 (×2): 325 mg via ORAL
  Filled 2019-01-02 (×2): qty 1

## 2019-01-02 MED ORDER — DEXAMETHASONE SODIUM PHOSPHATE 10 MG/ML IJ SOLN
10.0000 mg | Freq: Once | INTRAMUSCULAR | Status: AC
  Administered 2019-01-03: 10 mg via INTRAVENOUS
  Filled 2019-01-02: qty 1

## 2019-01-02 MED ORDER — METOCLOPRAMIDE HCL 5 MG/ML IJ SOLN: 5.0000 mg | Freq: Three times a day (TID) | INTRAMUSCULAR | Status: DC | PRN

## 2019-01-02 MED ORDER — SIMVASTATIN 40 MG PO TABS
40.0000 mg | ORAL_TABLET | Freq: Every evening | ORAL | Status: DC
  Administered 2019-01-02: 40 mg via ORAL
  Filled 2019-01-02: qty 1

## 2019-01-02 MED ORDER — MAGNESIUM CITRATE PO SOLN: 1.0000 | Freq: Once | ORAL | Status: DC | PRN

## 2019-01-02 MED ORDER — DEXAMETHASONE SODIUM PHOSPHATE 10 MG/ML IJ SOLN
INTRAMUSCULAR | Status: AC
  Filled 2019-01-02: qty 1

## 2019-01-02 MED ORDER — EPHEDRINE SULFATE-NACL 50-0.9 MG/10ML-% IV SOSY
PREFILLED_SYRINGE | INTRAVENOUS | Status: DC | PRN
  Administered 2019-01-02 (×7): 10 mg via INTRAVENOUS

## 2019-01-02 MED ORDER — CELECOXIB 200 MG PO CAPS
200.0000 mg | ORAL_CAPSULE | Freq: Two times a day (BID) | ORAL | Status: DC
  Administered 2019-01-02 – 2019-01-03 (×2): 200 mg via ORAL
  Filled 2019-01-02 (×2): qty 1

## 2019-01-02 MED ORDER — DOCUSATE SODIUM 100 MG PO CAPS
100.0000 mg | ORAL_CAPSULE | Freq: Two times a day (BID) | ORAL | Status: DC
  Administered 2019-01-02 – 2019-01-03 (×2): 100 mg via ORAL
  Filled 2019-01-02 (×2): qty 1

## 2019-01-02 MED ORDER — GALANTAMINE HYDROBROMIDE ER 8 MG PO CP24
8.0000 mg | ORAL_CAPSULE | Freq: Every day | ORAL | Status: DC
  Filled 2019-01-02: qty 1

## 2019-01-02 MED ORDER — SODIUM CHLORIDE 0.9 % IV SOLN
INTRAVENOUS | Status: DC
  Administered 2019-01-02 – 2019-01-03 (×2): via INTRAVENOUS

## 2019-01-02 MED ORDER — HYDROCODONE-ACETAMINOPHEN 5-325 MG PO TABS: 1.0000 | ORAL_TABLET | ORAL | Status: DC | PRN

## 2019-01-02 MED ORDER — HYDROMORPHONE HCL 1 MG/ML IJ SOLN: 0.2500 mg | INTRAMUSCULAR | Status: DC | PRN

## 2019-01-02 MED ORDER — PHENYLEPHRINE 40 MCG/ML (10ML) SYRINGE FOR IV PUSH (FOR BLOOD PRESSURE SUPPORT)
PREFILLED_SYRINGE | INTRAVENOUS | Status: DC | PRN
  Administered 2019-01-02 (×4): 80 ug via INTRAVENOUS

## 2019-01-02 MED ORDER — POLYETHYLENE GLYCOL 3350 17 G PO PACK
17.0000 g | PACK | Freq: Two times a day (BID) | ORAL | Status: DC
  Administered 2019-01-02 – 2019-01-03 (×2): 17 g via ORAL
  Filled 2019-01-02 (×2): qty 1

## 2019-01-02 MED ORDER — HYDROCODONE-ACETAMINOPHEN 7.5-325 MG PO TABS
1.0000 | ORAL_TABLET | ORAL | Status: DC | PRN
  Administered 2019-01-02 – 2019-01-03 (×3): 1 via ORAL
  Filled 2019-01-02 (×3): qty 1

## 2019-01-02 MED ORDER — METHOCARBAMOL 500 MG IVPB - SIMPLE MED
500.0000 mg | Freq: Four times a day (QID) | INTRAVENOUS | Status: DC | PRN
  Filled 2019-01-02: qty 50

## 2019-01-02 MED ORDER — EZETIMIBE 10 MG PO TABS
10.0000 mg | ORAL_TABLET | Freq: Every evening | ORAL | Status: DC
  Administered 2019-01-02: 18:00:00 10 mg via ORAL
  Filled 2019-01-02: qty 1

## 2019-01-02 MED ORDER — ONDANSETRON HCL 4 MG/2ML IJ SOLN: 4.0000 mg | Freq: Four times a day (QID) | INTRAMUSCULAR | Status: DC | PRN

## 2019-01-02 MED ORDER — ONDANSETRON HCL 4 MG/2ML IJ SOLN: 4.0000 mg | Freq: Once | INTRAMUSCULAR | Status: DC | PRN

## 2019-01-02 MED ORDER — METHOCARBAMOL 500 MG PO TABS
500.0000 mg | ORAL_TABLET | Freq: Four times a day (QID) | ORAL | Status: DC | PRN
  Administered 2019-01-02 – 2019-01-03 (×2): 500 mg via ORAL
  Filled 2019-01-02 (×2): qty 1

## 2019-01-02 MED ORDER — METOCLOPRAMIDE HCL 5 MG PO TABS: 5.0000 mg | ORAL_TABLET | Freq: Three times a day (TID) | ORAL | Status: DC | PRN

## 2019-01-02 MED ORDER — PROPOFOL 10 MG/ML IV BOLUS
INTRAVENOUS | Status: DC | PRN
  Administered 2019-01-02: 20 mg via INTRAVENOUS
  Administered 2019-01-02: 10 mg via INTRAVENOUS
  Administered 2019-01-02: 20 mg via INTRAVENOUS

## 2019-01-02 MED ORDER — FENTANYL CITRATE (PF) 100 MCG/2ML IJ SOLN
INTRAMUSCULAR | Status: DC | PRN
  Administered 2019-01-02: 50 ug via INTRAVENOUS

## 2019-01-02 MED ORDER — CEFAZOLIN SODIUM-DEXTROSE 2-4 GM/100ML-% IV SOLN
2.0000 g | INTRAVENOUS | Status: AC
  Administered 2019-01-02: 11:00:00 2 g via INTRAVENOUS
  Filled 2019-01-02: qty 100

## 2019-01-02 MED ORDER — ONDANSETRON HCL 4 MG PO TABS: 4.0000 mg | ORAL_TABLET | Freq: Four times a day (QID) | ORAL | Status: DC | PRN

## 2019-01-02 MED ORDER — MENTHOL 3 MG MT LOZG: 1.0000 | LOZENGE | OROMUCOSAL | Status: DC | PRN

## 2019-01-02 MED ORDER — APIXABAN 2.5 MG PO TABS
2.5000 mg | ORAL_TABLET | Freq: Two times a day (BID) | ORAL | Status: DC
  Administered 2019-01-03: 09:00:00 2.5 mg via ORAL
  Filled 2019-01-02: qty 1

## 2019-01-02 MED ORDER — ONDANSETRON HCL 4 MG/2ML IJ SOLN
INTRAMUSCULAR | Status: AC
  Filled 2019-01-02: qty 2

## 2019-01-02 MED ORDER — DEXAMETHASONE SODIUM PHOSPHATE 10 MG/ML IJ SOLN
10.0000 mg | Freq: Once | INTRAMUSCULAR | Status: AC
  Administered 2019-01-02: 11:00:00 10 mg via INTRAVENOUS

## 2019-01-02 MED ORDER — SERTRALINE HCL 100 MG PO TABS
100.0000 mg | ORAL_TABLET | Freq: Every day | ORAL | Status: DC
  Administered 2019-01-03: 09:00:00 100 mg via ORAL
  Filled 2019-01-02: qty 1

## 2019-01-02 MED ORDER — MEPERIDINE HCL 50 MG/ML IJ SOLN: 6.2500 mg | INTRAMUSCULAR | Status: DC | PRN

## 2019-01-02 MED ORDER — ACETAMINOPHEN 325 MG PO TABS: 325.0000 mg | ORAL_TABLET | Freq: Four times a day (QID) | ORAL | Status: DC | PRN

## 2019-01-02 SURGICAL SUPPLY — 43 items
ADH SKN CLS APL DERMABOND .7 (GAUZE/BANDAGES/DRESSINGS) ×1
BAG DECANTER FOR FLEXI CONT (MISCELLANEOUS) IMPLANT
BAG SPEC THK2 15X12 ZIP CLS (MISCELLANEOUS)
BAG ZIPLOCK 12X15 (MISCELLANEOUS) IMPLANT
BLADE SAG 18X100X1.27 (BLADE) ×3 IMPLANT
COVER PERINEAL POST (MISCELLANEOUS) ×3 IMPLANT
COVER SURGICAL LIGHT HANDLE (MISCELLANEOUS) ×3 IMPLANT
COVER WAND RF STERILE (DRAPES) IMPLANT
CUP ACETBLR 52 OD PINNACLE (Hips) ×2 IMPLANT
DERMABOND ADVANCED (GAUZE/BANDAGES/DRESSINGS) ×2
DERMABOND ADVANCED .7 DNX12 (GAUZE/BANDAGES/DRESSINGS) ×1 IMPLANT
DRAPE STERI IOBAN 125X83 (DRAPES) ×3 IMPLANT
DRAPE U-SHAPE 47X51 STRL (DRAPES) ×6 IMPLANT
DRESSING AQUACEL AG SP 3.5X10 (GAUZE/BANDAGES/DRESSINGS) ×1 IMPLANT
DRSG AQUACEL AG SP 3.5X10 (GAUZE/BANDAGES/DRESSINGS) ×3
DURAPREP 26ML APPLICATOR (WOUND CARE) ×3 IMPLANT
ELECT REM PT RETURN 15FT ADLT (MISCELLANEOUS) ×3 IMPLANT
ELIMINATOR HOLE APEX DEPUY (Hips) ×2 IMPLANT
FEM STEM 12/14 TAPER SZ 4 HIP (Orthopedic Implant) ×3 IMPLANT
FEMORAL STEM 12/14 TPR SZ4 HIP (Orthopedic Implant) IMPLANT
GLOVE BIO SURGEON STRL SZ 6 (GLOVE) ×6 IMPLANT
GLOVE BIOGEL PI IND STRL 6.5 (GLOVE) ×1 IMPLANT
GLOVE BIOGEL PI IND STRL 7.5 (GLOVE) ×1 IMPLANT
GLOVE BIOGEL PI INDICATOR 6.5 (GLOVE) ×2
GLOVE BIOGEL PI INDICATOR 7.5 (GLOVE) ×2
GLOVE ORTHO TXT STRL SZ7.5 (GLOVE) ×6 IMPLANT
GOWN STRL REUS W/TWL LRG LVL3 (GOWN DISPOSABLE) ×6 IMPLANT
HEAD CERAMIC DELTA 36 PLUS 1.5 (Hips) ×2 IMPLANT
HOLDER FOLEY CATH W/STRAP (MISCELLANEOUS) ×3 IMPLANT
KIT TURNOVER KIT A (KITS) IMPLANT
LINER NEUTRAL 52X36MM PLUS 4 (Liner) ×2 IMPLANT
PACK ANTERIOR HIP CUSTOM (KITS) ×3 IMPLANT
SCREW 6.5MMX30MM (Screw) ×2 IMPLANT
SUT MNCRL AB 4-0 PS2 18 (SUTURE) ×3 IMPLANT
SUT STRATAFIX 0 PDS 27 VIOLET (SUTURE) ×3
SUT VIC AB 1 CT1 36 (SUTURE) ×9 IMPLANT
SUT VIC AB 2-0 CT1 27 (SUTURE) ×6
SUT VIC AB 2-0 CT1 TAPERPNT 27 (SUTURE) ×2 IMPLANT
SUTURE STRATFX 0 PDS 27 VIOLET (SUTURE) ×1 IMPLANT
TRAY FOL W/BAG SLVR 16FR STRL (SET/KITS/TRAYS/PACK) IMPLANT
TRAY FOLEY W/BAG SLVR 16FR LF (SET/KITS/TRAYS/PACK) ×3
WATER STERILE IRR 1000ML POUR (IV SOLUTION) ×5 IMPLANT
YANKAUER SUCT BULB TIP 10FT TU (MISCELLANEOUS) ×2 IMPLANT

## 2019-01-02 NOTE — Anesthesia Postprocedure Evaluation (Signed)
Anesthesia Post Note  Patient: Pamela Mason  Procedure(s) Performed: TOTAL HIP ARTHROPLASTY ANTERIOR APPROACH (Left Hip)     Patient location during evaluation: PACU Anesthesia Type: Spinal Level of consciousness: oriented and awake and alert Pain management: pain level controlled Vital Signs Assessment: post-procedure vital signs reviewed and stable Respiratory status: spontaneous breathing, respiratory function stable and patient connected to nasal cannula oxygen Cardiovascular status: blood pressure returned to baseline and stable Postop Assessment: no headache, no backache and no apparent nausea or vomiting Anesthetic complications: no    Last Vitals:  Vitals:   01/02/19 1330 01/02/19 1345  BP: 115/69 118/73  Pulse: 69 73  Resp: 15 20  Temp:    SpO2: 100% 100%    Last Pain:  Vitals:   01/02/19 1345  TempSrc:   PainSc: 0-No pain                 Mariely Mahr DAVID

## 2019-01-02 NOTE — Anesthesia Procedure Notes (Signed)
Procedure Name: MAC Performed by: Linford Quintela L, CRNA Pre-anesthesia Checklist: Patient identified, Emergency Drugs available, Suction available, Patient being monitored and Timeout performed Patient Re-evaluated:Patient Re-evaluated prior to induction Oxygen Delivery Method: Simple face mask Preoxygenation: Pre-oxygenation with 100% oxygen Induction Type: IV induction Number of attempts: 1 Placement Confirmation: positive ETCO2 Dental Injury: Teeth and Oropharynx as per pre-operative assessment        

## 2019-01-02 NOTE — Op Note (Signed)
NAME:  Pamela Mason                ACCOUNT NO.: [id number]      MEDICAL RECORD NO.: [id number]      FACILITY:  [facility]      PHYSICIAN:  Mauri Pole  DATE OF BIRTH:  July 08, 1945     DATE OF PROCEDURE:  01/02/2019                                 OPERATIVE REPORT         PREOPERATIVE DIAGNOSIS: Left  hip osteoarthritis.      POSTOPERATIVE DIAGNOSIS:  Left hip osteoarthritis.      PROCEDURE:  Left total hip replacement through an anterior approach   utilizing DePuy THR system, component size 51mm pinnacle cup, a size 36+4 neutral   Altrex liner, a size 4 Hi Actis stem with a 36+1.5 delta ceramic   ball.      SURGEON:  Pietro Cassis. Alvan Dame, M.D.      ASSISTANT:  Griffith Citron, PA-C     ANESTHESIA:  Spinal.      SPECIMENS:  None.      COMPLICATIONS:  None.      BLOOD LOSS:  200 cc     DRAINS:  None.      INDICATION OF THE PROCEDURE:  Pamela Mason is a 73 y.o. female who had   presented to office for evaluation of left hip pain.  Radiographs revealed   progressive degenerative changes with bone-on-bone   articulation of the  hip joint, including subchondral cystic changes and osteophytes.  The patient had painful limited range of   motion significantly affecting their overall quality of life and function.  The patient was failing to    respond to conservative measures including medications and/or injections and activity modification and at this point was ready   to proceed with more definitive measures.  Consent was obtained for   benefit of pain relief.  Specific risks of infection, DVT, component   failure, dislocation, neurovascular injury, and need for revision surgery were reviewed in the office as well discussion of   the anterior versus posterior approach were reviewed.     PROCEDURE IN DETAIL:  The patient was brought to operative theater.   Once adequate anesthesia, preoperative antibiotics, 2 gm of Ancef, 1 gm of Tranexamic Acid, and 10  mg of Decadron were administered, the patient was positioned supine on the [employer] table.  Once the patient was safely positioned with adequate padding of boney prominences we predraped out the hip, and used fluoroscopy to confirm orientation of the pelvis.      The left hip was then prepped and draped from proximal iliac crest to   mid thigh with a shower curtain technique.      Time-out was performed identifying the patient, planned procedure, and the appropriate extremity.     An incision was then made 2 cm lateral to the   anterior superior iliac spine extending over the orientation of the   tensor fascia lata muscle and sharp dissection was carried down to the   fascia of the muscle.      The fascia was then incised.  The muscle belly was identified and swept   laterally and retractor placed along the superior neck.  Following   cauterization of the circumflex vessels and removing some pericapsular  fat, a second cobra retractor was placed on the inferior neck.  A T-capsulotomy was made along the line of the   superior neck to the trochanteric fossa, then extended proximally and   distally.  Tag sutures were placed and the retractors were then placed   intracapsular.  We then identified the trochanteric fossa and   orientation of my neck cut and then made a neck osteotomy with the femur on traction.  The femoral   head was removed without difficulty or complication.  Traction was let   off and retractors were placed posterior and anterior around the   acetabulum.      The labrum and foveal tissue were debrided.  I began reaming with a 45 mm   reamer and reamed up to 51 mm reamer with good bony bed preparation and a 52 mm  cup was chosen.  The final 52 mm Pinnacle cup was then impacted under fluoroscopy to confirm the depth of penetration and orientation with respect to   Abduction and forward flexion.  A screw was placed into the ilium followed by the hole eliminator.  The final    36+4 neutral Altrex liner was impacted with good visualized rim fit.  The cup was positioned anatomically within the acetabular portion of the pelvis.      At this point, the femur was rolled to 100 degrees.  Further capsule was   released off the inferior aspect of the femoral neck.  I then   released the superior capsule proximally.  With the leg in a neutral position the hook was placed laterally   along the femur under the vastus lateralis origin and elevated manually and then held in position using the hook attachment on the bed.  The leg was then extended and adducted with the leg rolled to 100   degrees of external rotation.  Retractors were placed along the medial calcar and posteriorly over the greater trochanter.  Once the proximal femur was fully   exposed, I used a box osteotome to set orientation.  I then began   broaching with the starting chili pepper broach and passed this by hand and then broached up to 4.  With the 4 broach in place I chose a high offset neck and did several trial reductions.  The offset was appropriate, leg lengths   appeared to be equal best matched with the +1.5 head ball trial confirmed radiographically.   Given these findings, I went ahead and dislocated the hip, repositioned all   retractors and positioned the right hip in the extended and abducted position.  The final 4 Hi Actis stem was   chosen and it was impacted down to the level of neck cut.  Based on this   and the trial reductions, a final 36+1.5 delta ceramic ball was chosen and   impacted onto a clean and dry trunnion, and the hip was reduced.  The   hip had been irrigated throughout the case again at this point.  I did   reapproximate the superior capsular leaflet to the anterior leaflet   using #1 Vicryl.  The fascia of the   tensor fascia lata muscle was then reapproximated using #1 Vicryl and #0 Stratafix sutures.  The   remaining wound was closed with 2-0 Vicryl and running 4-0 Monocryl.    The hip was cleaned, dried, and dressed sterilely using Dermabond and   Aquacel dressing.  The patient was then brought   to recovery room in  stable condition tolerating the procedure well.    Griffith Citron, PA-C was present for the entirety of the case involved from   preoperative positioning, perioperative retractor management, general   facilitation of the case, as well as primary wound closure as assistant.            Pietro Cassis Alvan Dame, M.D.        01/02/2019 11:15 AM

## 2019-01-02 NOTE — Evaluation (Signed)
Physical Therapy Evaluation Patient Details Name: Pamela Mason MRN: [id number] DOB: [birth year] Today's Date: 01/02/2019   History of Present Illness  73 yo female s/p L DA-THA on 01/02/19. PMH includes anxiety, osteoporosis, visual difficulty with history of multiple surgeries, HLD, HTN, dementia, depression, HLD, HTN, RTC repair.  Clinical Impression  Pt presents with L hip pain, decreased L hip strength post-operatively, increased time and effort to perform mobility tasks, and decreased activity tolerance. Pt to benefit from acute PT to address deficits. Pt ambulated 120 ft with RW with min guard assist, verbal cuing for form and safety provided throughout. Pt educated on ankle pumps (20/hour) to perform this afternoon/evening to increase circulation, to pt's tolerance and limited by pain. PT to progress mobility as tolerated, and will continue to follow acutely.        Follow Up Recommendations Follow surgeon's recommendation for DC plan and follow-up therapies;Supervision for mobility/OOB    Equipment Recommendations  Rolling walker with 5" wheels;3in1 (PT)    Recommendations for Other Services       Precautions / Restrictions Precautions Precautions: Fall Restrictions Weight Bearing Restrictions: No Other Position/Activity Restrictions: WBAT      Mobility  Bed Mobility Overal bed mobility: Needs Assistance Bed Mobility: Supine to Sit     Supine to sit: HOB elevated;Min assist     General bed mobility comments: Min assist for LLE management, increased time and effort.  Transfers Overall transfer level: Needs assistance Equipment used: Rolling walker (2 wheeled) Transfers: Sit to/from Stand Sit to Stand: From elevated surface;Min guard         General transfer comment: Min guard for safety, verbal cuing for hand placement when rising.  Ambulation/Gait Ambulation/Gait assistance: Min guard Gait Distance (Feet): 120 Feet Assistive device: Rolling walker (2  wheeled) Gait Pattern/deviations: Step-to pattern;Step-through pattern;Decreased stride length Gait velocity: decr   General Gait Details: min guard for safety, ocasional min assist for steadying initially. Verbal cuing for sequencing, placement in RW, turning with RW.  Stairs            Wheelchair Mobility    Modified Rankin (Stroke Patients Only)       Balance Overall balance assessment: Mild deficits observed, not formally tested                                           Pertinent Vitals/Pain Pain Assessment: 0-10 Pain Score: 3  Pain Location: L hip Pain Descriptors / Indicators: Sore Pain Intervention(s): Limited activity within patient's tolerance;Monitored during session;Premedicated before session;Repositioned;Ice applied    Home Living Family/patient expects to be discharged to:: Private residence Living Arrangements: Alone Available Help at Discharge: Family;Available 24 hours/day(daughter from [place] to stay with pt for the first week, son to stay with pt for the next week) Type of Home: House Home Access: Stairs to enter   [employer] of Steps: 2 Home Layout: Two level;Bed/bath upstairs Home Equipment: Cane - single point      Prior Function Level of Independence: Independent with assistive device(s)         Comments: pt reports using cane for ambulation PTA.     Hand Dominance   Dominant Hand: Right    Extremity/Trunk Assessment   Upper Extremity Assessment Upper Extremity Assessment: Overall WFL for tasks assessed    Lower Extremity Assessment Lower Extremity Assessment: Overall WFL for tasks assessed;LLE deficits/detail LLE Deficits / Details:  suspected post-surgical weakness; able to perform ankle pumps, quad set, heel slide LLE Sensation: WNL    Cervical / Trunk Assessment Cervical / Trunk Assessment: Normal  Communication   Communication: No difficulties  Cognition Arousal/Alertness:  Awake/alert Behavior During Therapy: WFL for tasks assessed/performed Overall Cognitive Status: History of cognitive impairments - at baseline                                 General Comments: pt with documented history of dementia. Pt with some memory difficulty with recalling home set up, pt's daughter filled in information as needed.      General Comments      Exercises     Assessment/Plan    PT Assessment Patient needs continued PT services  PT Problem List Decreased strength;Decreased mobility;Decreased range of motion;Decreased activity tolerance;Decreased balance;Decreased knowledge of use of DME;Pain       PT Treatment Interventions DME instruction;Therapeutic activities;Gait training;Therapeutic exercise;Patient/family education;Balance training;Stair training;Functional mobility training    PT Goals (Current goals can be found in the Care Plan section)  Acute Rehab PT Goals Patient Stated Goal: go home PT Goal Formulation: With patient Time For Goal Achievement: 01/09/19 Potential to Achieve Goals: Good    Frequency 7X/week   Barriers to discharge        Co-evaluation               AM-PAC PT "6 Clicks" Mobility  Outcome Measure Help needed turning from your back to your side while in a flat bed without using bedrails?: A Little Help needed moving from lying on your back to sitting on the side of a flat bed without using bedrails?: A Little Help needed moving to and from a bed to a chair (including a wheelchair)?: A Little Help needed standing up from a chair using your arms (e.g., wheelchair or bedside chair)?: A Little Help needed to walk in hospital room?: A Little Help needed climbing 3-5 steps with a railing? : A Little 6 Click Score: 18    End of Session Equipment Utilized During Treatment: Gait belt Activity Tolerance: Patient tolerated treatment well Patient left: in chair;with chair alarm set;with call bell/phone within  reach;with family/visitor present(on SCD break for skin integrity) Nurse Communication: Mobility status PT Visit Diagnosis: Other abnormalities of gait and mobility (R26.89);Difficulty in walking, not elsewhere classified (R26.2)    Time: 1630-1650 PT Time Calculation (min) (ACUTE ONLY): 20 min   Charges:   PT Evaluation $PT Eval Low Complexity: 1 Low         Julien Girt, PT Acute Rehabilitation Services Pager [phone]  Office [phone]  Roxine Caddy D Elonda Husky 01/02/2019, 6:12 PM

## 2019-01-02 NOTE — Discharge Instructions (Addendum)
Information on my medicine - ELIQUIS (apixaban)  Why was Eliquis prescribed for you? Eliquis was prescribed for you to reduce the risk of blood clots forming after orthopedic surgery.    What do You need to know about Eliquis? Take your Eliquis TWICE DAILY - one tablet in the morning and one tablet in the evening with or without food.  It would be best to take the dose about the same time each day.  If you have difficulty swallowing the tablet whole please discuss with your pharmacist how to take the medication safely.  Take Eliquis exactly as prescribed by your doctor and DO NOT stop taking Eliquis without talking to the doctor who prescribed the medication.  Stopping without other medication to take the place of Eliquis may increase your risk of developing a clot.  After discharge, you should have regular check-up appointments with your healthcare provider that is prescribing your Eliquis.  What do you do if you miss a dose? If a dose of ELIQUIS is not taken at the scheduled time, take it as soon as possible on the same day and twice-daily administration should be resumed.  The dose should not be doubled to make up for a missed dose.  Do not take more than one tablet of ELIQUIS at the same time.  Important Safety Information A possible side effect of Eliquis is bleeding. You should call your healthcare provider right away if you experience any of the following: ? Bleeding from an injury or your nose that does not stop. ? Unusual colored urine (red or dark brown) or unusual colored stools (red or black). ? Unusual bruising for unknown reasons. ? A serious fall or if you hit your head (even if there is no bleeding).  Some medicines may interact with Eliquis and might increase your risk of bleeding or clotting while on Eliquis. To help avoid this, consult your healthcare provider or pharmacist prior to using any new prescription or non-prescription medications, including herbals,  vitamins, non-steroidal anti-inflammatory drugs (NSAIDs) and supplements.  This website has more information on Eliquis (apixaban): http://www.eliquis.com/eliquis/home    INSTRUCTIONS AFTER JOINT REPLACEMENT   o Remove items at home which could result in a fall. This includes throw rugs or furniture in walking pathways o ICE to the affected joint every three hours while awake for 30 minutes at a time, for at least the first 3-5 days, and then as needed for pain and swelling.  Continue to use ice for pain and swelling. You may notice swelling that will progress down to the foot and ankle.  This is normal after surgery.  Elevate your leg when you are not up walking on it.   o Continue to use the breathing machine you got in the hospital (incentive spirometer) which will help keep your temperature down.  It is common for your temperature to cycle up and down following surgery, especially at night when you are not up moving around and exerting yourself.  The breathing machine keeps your lungs expanded and your temperature down.   DIET:  As you were doing prior to hospitalization, we recommend a well-balanced diet.  DRESSING / WOUND CARE / SHOWERING  Keep the surgical dressing until follow up.  The dressing is water proof, so you can shower without any extra covering.  IF THE DRESSING FALLS OFF or the wound gets wet inside, change the dressing with sterile gauze.  Please use good hand washing techniques before changing the dressing.  Do not use any  lotions or creams on the incision until instructed by your surgeon.    ACTIVITY  o Increase activity slowly as tolerated, but follow the weight bearing instructions below.   o No driving for 6 weeks or until further direction given by your physician.  You cannot drive while taking narcotics.  o No lifting or carrying greater than 10 lbs. until further directed by your surgeon. o Avoid periods of inactivity such as sitting longer than an hour when not  asleep. This helps prevent blood clots.  o You may return to work once you are authorized by your doctor.     WEIGHT BEARING   Weight bearing as tolerated with assist device (walker, cane, etc) as directed, use it as long as suggested by your surgeon or therapist, typically at least 4-6 weeks.   EXERCISES  Results after joint replacement surgery are often greatly improved when you follow the exercise, range of motion and muscle strengthening exercises prescribed by your doctor. Safety measures are also important to protect the joint from further injury. Any time any of these exercises cause you to have increased pain or swelling, decrease what you are doing until you are comfortable again and then slowly increase them. If you have problems or questions, call your caregiver or physical therapist for advice.   Rehabilitation is important following a joint replacement. After just a few days of immobilization, the muscles of the leg can become weakened and shrink (atrophy).  These exercises are designed to build up the tone and strength of the thigh and leg muscles and to improve motion. Often times heat used for twenty to thirty minutes before working out will loosen up your tissues and help with improving the range of motion but do not use heat for the first two weeks following surgery (sometimes heat can increase post-operative swelling).   These exercises can be done on a training (exercise) mat, on the floor, on a table or on a bed. Use whatever works the best and is most comfortable for you.    Use music or television while you are exercising so that the exercises are a pleasant break in your day. This will make your life better with the exercises acting as a break in your routine that you can look forward to.   Perform all exercises about fifteen times, three times per day or as directed.  You should exercise both the operative leg and the other leg as well.  Exercises include:    Quad Sets -  Tighten up the muscle on the front of the thigh (Quad) and hold for 5-10 seconds.    Straight Leg Raises - With your knee straight (if you were given a brace, keep it on), lift the leg to 60 degrees, hold for 3 seconds, and slowly lower the leg.  Perform this exercise against resistance later as your leg gets stronger.   Leg Slides: Lying on your back, slowly slide your foot toward your buttocks, bending your knee up off the floor (only go as far as is comfortable). Then slowly slide your foot back down until your leg is flat on the floor again.   Angel Wings: Lying on your back spread your legs to the side as far apart as you can without causing discomfort.   Hamstring Strength:  Lying on your back, push your heel against the floor with your leg straight by tightening up the muscles of your buttocks.  Repeat, but this time bend your knee to a comfortable  angle, and push your heel against the floor.  You may put a pillow under the heel to make it more comfortable if necessary.   A rehabilitation program following joint replacement surgery can speed recovery and prevent re-injury in the future due to weakened muscles. Contact your doctor or a physical therapist for more information on knee rehabilitation.    CONSTIPATION  Constipation is defined medically as fewer than three stools per week and severe constipation as less than one stool per week.  Even if you have a regular bowel pattern at home, your normal regimen is likely to be disrupted due to multiple reasons following surgery.  Combination of anesthesia, postoperative narcotics, change in appetite and fluid intake all can affect your bowels.   YOU MUST use at least one of the following options; they are listed in order of increasing strength to get the job done.  They are all available over the counter, and you may need to use some, POSSIBLY even all of these options:    Drink plenty of fluids (prune juice may be helpful) and high fiber  foods Colace 100 mg by mouth twice a day  Senokot for constipation as directed and as needed Dulcolax (bisacodyl), take with full glass of water  Miralax (polyethylene glycol) once or twice a day as needed.  If you have tried all these things and are unable to have a bowel movement in the first 3-4 days after surgery call either your surgeon or your primary doctor.    If you experience loose stools or diarrhea, hold the medications until you stool forms back up.  If your symptoms do not get better within 1 week or if they get worse, check with your doctor.  If you experience "the worst abdominal pain ever" or develop nausea or vomiting, please contact the office immediately for further recommendations for treatment.   ITCHING:  If you experience itching with your medications, try taking only a single pain pill, or even half a pain pill at a time.  You can also use Benadryl over the counter for itching or also to help with sleep.   TED HOSE STOCKINGS:  Use stockings on both legs until for at least 2 weeks or as directed by physician office. They may be removed at night for sleeping.  MEDICATIONS:  See your medication summary on the After Visit Summary that nursing will review with you.  You may have some home medications which will be placed on hold until you complete the course of blood thinner medication.  It is important for you to complete the blood thinner medication as prescribed.  PRECAUTIONS:  If you experience chest pain or shortness of breath - call 911 immediately for transfer to the hospital emergency department.   If you develop a fever greater that 101 F, purulent drainage from wound, increased redness or drainage from wound, foul odor from the wound/dressing, or calf pain - CONTACT YOUR SURGEON.                                                   FOLLOW-UP APPOINTMENTS:  If you do not already have a post-op appointment, please call the office for an appointment to be seen by your  surgeon.  Guidelines for how soon to be seen are listed in your After Visit Summary, but are typically between  1-4 weeks after surgery.  OTHER INSTRUCTIONS:   Knee Replacement:  Do not place pillow under knee, focus on keeping the knee straight while resting.   MAKE SURE YOU:   Understand these instructions.   Get help right away if you are not doing well or get worse.    Thank you for letting us be a part of your medical care team.  It is a privilege we respect greatly.  We hope these instructions will help you stay on track for a fast and full recovery!

## 2019-01-02 NOTE — Interval H&P Note (Signed)
History and Physical Interval Note:  01/02/2019 9:55 AM  Sharon Seller  has presented today for surgery, with the diagnosis of Left hip osteoarthritis.  The various methods of treatment have been discussed with the patient and family. After consideration of risks, benefits and other options for treatment, the patient has consented to  Procedure(s) with comments: TOTAL HIP ARTHROPLASTY ANTERIOR APPROACH (Left) - 70 mins as a surgical intervention.  The patient's history has been reviewed, patient examined, no change in status, stable for surgery.  I have reviewed the patient's chart and labs.  Questions were answered to the patient's satisfaction.     Mauri Pole

## 2019-01-02 NOTE — Transfer of Care (Signed)
Immediate Anesthesia Transfer of Care Note  Patient: Pamela Mason  Procedure(s) Performed: TOTAL HIP ARTHROPLASTY ANTERIOR APPROACH (Left Hip)  Patient Location: PACU  Anesthesia Type:MAC and Spinal  Level of Consciousness: awake, alert , oriented and patient cooperative  Airway & Oxygen Therapy: Patient Spontanous Breathing and Patient connected to face mask oxygen  Post-op Assessment: Report given to RN and Post -op Vital signs reviewed and stable  Post vital signs: Reviewed and stable  Last Vitals:  Vitals Value Taken Time  BP 97/60 01/02/19 1245  Temp    Pulse 73 01/02/19 1247  Resp 11 01/02/19 1247  SpO2 100 % 01/02/19 1247  Vitals shown include unvalidated device data.  Last Pain:  Vitals:   01/02/19 0936  TempSrc: Oral      Patients Stated Pain Goal: 4 (60/63/01 6010)  Complications: No apparent anesthesia complications

## 2019-01-02 NOTE — Care Plan (Signed)
Ortho Bundle Case Management Note  Patient Details  Name: Pamela Mason MRN: [id number] Date of Birth: 1945-11-30  L THA scheduled on 01-02-19 DCP:  Home with dtr.  2 story home with 2 ste. DME:  RW and 3-in-1 ordered through [place] PT:  HEP                  DME Arranged:  3-N-1, Walker rolling DME Agency:  [employer]  HH Arranged:  NA HH Agency:  NA  Additional Comments: Please contact me with any questions of if this plan should need to change.  Marianne Sofia, RN,CCM [facility]  [phone] 01/02/2019, 11:47 AM

## 2019-01-03 ENCOUNTER — Encounter (HOSPITAL_COMMUNITY): Payer: Self-pay | Admitting: Orthopedic Surgery

## 2019-01-03 DIAGNOSIS — G4730 Sleep apnea, unspecified: Secondary | ICD-10-CM | POA: Diagnosis not present

## 2019-01-03 DIAGNOSIS — F419 Anxiety disorder, unspecified: Secondary | ICD-10-CM | POA: Diagnosis not present

## 2019-01-03 DIAGNOSIS — F0390 Unspecified dementia without behavioral disturbance: Secondary | ICD-10-CM | POA: Diagnosis not present

## 2019-01-03 DIAGNOSIS — F329 Major depressive disorder, single episode, unspecified: Secondary | ICD-10-CM | POA: Diagnosis not present

## 2019-01-03 DIAGNOSIS — M1612 Unilateral primary osteoarthritis, left hip: Secondary | ICD-10-CM | POA: Diagnosis not present

## 2019-01-03 DIAGNOSIS — I10 Essential (primary) hypertension: Secondary | ICD-10-CM | POA: Diagnosis not present

## 2019-01-03 LAB — CBC
HCT: 31 % — ABNORMAL LOW (ref 36.0–46.0)
Hemoglobin: 10 g/dL — ABNORMAL LOW (ref 12.0–15.0)
MCH: 32.7 pg (ref 26.0–34.0)
MCHC: 32.3 g/dL (ref 30.0–36.0)
MCV: 101.3 fL — ABNORMAL HIGH (ref 80.0–100.0)
Platelets: 213 10*3/uL (ref 150–400)
RBC: 3.06 MIL/uL — ABNORMAL LOW (ref 3.87–5.11)
RDW: 12 % (ref 11.5–15.5)
WBC: 6.8 10*3/uL (ref 4.0–10.5)
nRBC: 0 % (ref 0.0–0.2)

## 2019-01-03 LAB — BASIC METABOLIC PANEL
Anion gap: 6 (ref 5–15)
BUN: 14 mg/dL (ref 8–23)
CO2: 25 mmol/L (ref 22–32)
Calcium: 8.3 mg/dL — ABNORMAL LOW (ref 8.9–10.3)
Chloride: 107 mmol/L (ref 98–111)
Creatinine, Ser: 0.69 mg/dL (ref 0.44–1.00)
GFR calc Af Amer: >60 mL/min (ref >60)
GFR calc non Af Amer: >60 mL/min (ref >60)
Glucose, Bld: 115 mg/dL — ABNORMAL HIGH (ref 70–99)
Potassium: 3.9 mmol/L (ref 3.5–5.1)
Sodium: 138 mmol/L (ref 135–145)

## 2019-01-03 MED ORDER — METHOCARBAMOL 500 MG PO TABS: 500.0000 mg | ORAL_TABLET | Freq: Four times a day (QID) | ORAL | 0 refills | Status: DC | PRN

## 2019-01-03 MED ORDER — HYDROCODONE-ACETAMINOPHEN 5-325 MG PO TABS: 1.0000 | ORAL_TABLET | ORAL | 0 refills | Status: DC | PRN

## 2019-01-03 MED ORDER — DOCUSATE SODIUM 100 MG PO CAPS: 100.0000 mg | ORAL_CAPSULE | Freq: Two times a day (BID) | ORAL | 0 refills | Status: DC

## 2019-01-03 MED ORDER — ASPIRIN 81 MG PO CHEW: 81.0000 mg | CHEWABLE_TABLET | Freq: Two times a day (BID) | ORAL | 0 refills | Status: DC

## 2019-01-03 MED ORDER — FERROUS SULFATE 325 (65 FE) MG PO TABS: 325.0000 mg | ORAL_TABLET | Freq: Three times a day (TID) | ORAL | 0 refills | Status: DC

## 2019-01-03 MED ORDER — POLYETHYLENE GLYCOL 3350 17 G PO PACK: 17.0000 g | PACK | Freq: Two times a day (BID) | ORAL | 0 refills | Status: DC

## 2019-01-03 NOTE — Plan of Care (Signed)
  Problem: Education: Goal: Knowledge of the prescribed therapeutic regimen will improve 01/03/2019 0230 by Keturah Shavers, RN Outcome: Progressing  Problem: Pain Management: Goal: Pain level will decrease with appropriate interventions 01/03/2019 0230 by Keturah Shavers, RN Outcome: Progressing  Problem: Safety: Goal: Ability to remain free from injury will improve 01/03/2019 0230 by Keturah Shavers, RN Outcome: Progressing

## 2019-01-03 NOTE — Discharge Summary (Signed)
Physician Discharge Summary  Patient ID: Pamela Mason MRN: [id number] DOB/AGE: [birth year] 73 y.o.  Admit date: 01/02/2019 Discharge date: 01/03/2019   Procedures:  Procedure(s) (LRB): TOTAL HIP ARTHROPLASTY ANTERIOR APPROACH (Left)  Attending Physician:  Dr. Paralee Mason   Admission Diagnoses:   Left hip primary OA / pain  Discharge Diagnoses:  Principal Problem:   S/P left THA, AA  Past Medical History:  Diagnosis Date  . Anxiety   . Arthritis   . Complication of anesthesia    slow to wake up  . Dementia ([place]) 2018   memory loss  . Depression   . Hyperlipidemia   . Hypertension   . Osteopenia   . Sleep apnea     HPI:    Pamela Mason, 73 y.o. female, has a history of pain and functional disability in the left hip(s) due to arthritis and patient has failed non-surgical conservative treatments for greater than 12 weeks to include NSAID's and/or analgesics, use of assistive devices and activity modification.  Onset of symptoms was gradual starting <1 years ago with gradually worsening course since that time.The patient noted no past surgery on the left hip(s).  Patient currently rates pain in the left hip at 8 out of 10 with activity. Patient has worsening of pain with activity and weight bearing, trendelenberg gait, pain that interfers with activities of daily living and pain with passive range of motion. Patient has evidence of periarticular osteophytes and joint space narrowing by imaging studies. This condition presents safety issues increasing the risk of falls. There is no current active infection.  Risks, benefits and expectations were discussed with the patient.  Risks including but not limited to the risk of anesthesia, blood clots, nerve damage, blood vessel damage, failure of the prosthesis, infection and up to and including death.  Patient understand the risks, benefits and expectations and wishes to proceed with surgery.  PCP: Crist Infante, MD   Discharged  Condition: good  Hospital Course:  Patient underwent the above stated procedure on 01/02/2019. Patient tolerated the procedure well and brought to the recovery room in good condition and subsequently to the floor.  POD #1 BP: 131/75 ; Pulse: 73 ; Temp: 98.4 F (36.9 C) ; Resp: 16 Patient reports pain as mild, pain controlled with medication.  No reported events throughout the night.  Dr. Alvan Dame discussed the procedure, findings and the expectations moving forward.  Patient is ready be discharged home. Dorsiflexion/plantar flexion intact, incision: dressing C/D/I, no cellulitis present and compartment soft.   LABS  Basename    HGB     10.0  HCT     31.0    Discharge Exam: General appearance: alert, cooperative and no distress Extremities: Homans sign is negative, no sign of DVT, no edema, redness or tenderness in the calves or thighs and no ulcers, gangrene or trophic changes  Disposition:  Home with follow up in 2 weeks   Follow-up Information    Pamela Cancel, MD. Go on 01/17/2019.   Specialty: Orthopedic Surgery Why: You are scheduled for a post-operative appointment on 01-17-19 at 9:15 am. Contact information: [address] [place] [address] [id number]           Discharge Instructions    Call MD / Call 911   Complete by: As directed    If you experience chest pain or shortness of breath, CALL 911 and be transported to the hospital emergency room.  If you develope a fever above 101 F,  pus (white drainage) or increased drainage or redness at the wound, or calf pain, call your surgeon's office.   Change dressing   Complete by: As directed    Maintain surgical dressing until follow up in the clinic. If the edges start to pull up, may reinforce with tape. If the dressing is no longer working, may remove and cover with gauze and tape, but must keep the area dry and clean.  Call with any questions or concerns.   Constipation Prevention   Complete by: As directed     Drink plenty of fluids.  Prune juice may be helpful.  You may use a stool softener, such as Colace (over the counter) 100 mg twice a day.  Use MiraLax (over the counter) for constipation as needed.   Diet - low sodium heart healthy   Complete by: As directed    Discharge instructions   Complete by: As directed    Maintain surgical dressing until follow up in the clinic. If the edges start to pull up, may reinforce with tape. If the dressing is no longer working, may remove and cover with gauze and tape, but must keep the area dry and clean.  Follow up in 2 weeks at [facility]. Call with any questions or concerns.   Increase activity slowly as tolerated   Complete by: As directed    Weight bearing as tolerated with assist device (walker, cane, etc) as directed, use it as long as suggested by your surgeon or therapist, typically at least 4-6 weeks.   TED hose   Complete by: As directed    Use stockings (TED hose) for 2 weeks on both leg(s).  You may remove them at night for sleeping.      Allergies as of 01/03/2019      Reactions   Latex    Pt stated she had a reaction to latex in the past. H. Mickley RN   Tape Other (See Comments)   redness      Medication List    STOP taking these medications   ibuprofen 200 MG tablet Commonly known as: ADVIL     TAKE these medications   aspirin 81 MG chewable tablet Commonly known as: Aspirin Childrens Chew 1 tablet (81 mg total) by mouth 2 (two) times daily. Take for 4 weeks, then resume regular dose. Start taking on: January 04, 2019 Notes to patient: Blood thinner   docusate sodium 100 MG capsule Commonly known as: Colace Take 1 capsule (100 mg total) by mouth 2 (two) times daily. Notes to patient: Stool softner   ezetimibe 10 MG tablet Commonly known as: ZETIA Take 10 mg by mouth every evening.   ferrous sulfate 325 (65 FE) MG tablet Commonly known as: FerrouSul Take 1 tablet (325 mg total) by mouth 3 (three) times  daily with meals for 14 days.   FLINTSTONES PLUS IRON PO Take 1 tablet by mouth daily with lunch.   galantamine 8 MG 24 hr capsule Commonly known as: RAZADYNE ER Take 8 mg by mouth daily with lunch.   HYDROcodone-acetaminophen 5-325 MG tablet Commonly known as: Norco Take 1-2 tablets by mouth every 4 (four) hours as needed for moderate pain or severe pain.   lisinopril 10 MG tablet Commonly known as: ZESTRIL Take 10 mg by mouth every evening.   methocarbamol 500 MG tablet Commonly known as: Robaxin Take 1 tablet (500 mg total) by mouth every 6 (six) hours as needed for muscle spasms. Notes to patient: Muscle Relaxer  polyethylene glycol 17 g packet Commonly known as: MIRALAX / GLYCOLAX Take 17 g by mouth 2 (two) times daily.   sertraline 50 MG tablet Commonly known as: ZOLOFT Take 100 mg by mouth daily.   simvastatin 40 MG tablet Commonly known as: ZOCOR Take 40 mg by mouth every evening.   vitamin B-12 500 MCG tablet Commonly known as: CYANOCOBALAMIN Take 500 mcg by mouth daily.   Vitamin D (Ergocalciferol) 1.25 MG (50000 UT) Caps capsule Commonly known as: DRISDOL Take 50,000 Units by mouth every Sunday.            Discharge Care Instructions  (From admission, onward)         Start     Ordered   01/03/19 0000  Change dressing    Comments: Maintain surgical dressing until follow up in the clinic. If the edges start to pull up, may reinforce with tape. If the dressing is no longer working, may remove and cover with gauze and tape, but must keep the area dry and clean.  Call with any questions or concerns.   01/03/19 [id number]           Signed: [place] Pugh. Srihitha Tagliaferri   PA-C  01/03/2019, 4:31 PM

## 2019-01-03 NOTE — Progress Notes (Signed)
     Subjective: 1 Day Post-Op Procedure(s) (LRB): TOTAL HIP ARTHROPLASTY ANTERIOR APPROACH (Left)   Patient reports pain as mild, pain controlled with medication.  No reported events throughout the night.  Dr. Alvan Dame discussed the procedure, findings and the expectations moving forward.  Patient is ready be discharged home, if he does well with therapy.  Patient follow-up in the clinic in 2 weeks.     Objective:   VITALS:   Vitals:   01/03/19 0054 01/03/19 0543  BP: 103/73 131/75  Pulse: 70 73  Resp: 16 16  Temp: 98 F (36.7 C) 98.4 F (36.9 C)  SpO2: 97% 98%    Dorsiflexion/Plantar flexion intact Incision: dressing C/D/I No cellulitis present Compartment soft  LABS Recent Labs    01/03/19 0242  HGB 10.0*  HCT 31.0*  WBC 6.8  PLT 213    Recent Labs    01/03/19 0242  NA 138  K 3.9  BUN 14  CREATININE 0.69  GLUCOSE 115*     Assessment/Plan: 1 Day Post-Op Procedure(s) (LRB): TOTAL HIP ARTHROPLASTY ANTERIOR APPROACH (Left) Foley cath d/c'ed Advance diet Up with therapy D/C IV fluids Discharge home Follow up in 2 weeks at [facility] Follow up with OLIN,Pennelope Basque D in 2 weeks.  Contact information:  [facility]) [address], [address] [place]. [name]   PAC  01/03/2019, 8:25 AM

## 2019-01-03 NOTE — Plan of Care (Signed)
  Problem: Education: Goal: Knowledge of the prescribed therapeutic regimen will improve Outcome: Adequate for Discharge Goal: Understanding of discharge needs will improve Outcome: Adequate for Discharge Goal: Individualized Educational Video(s) Outcome: Adequate for Discharge   Problem: Activity: Goal: Ability to avoid complications of mobility impairment will improve Outcome: Adequate for Discharge Goal: Ability to tolerate increased activity will improve Outcome: Adequate for Discharge   Problem: Clinical Measurements: Goal: Postoperative complications will be avoided or minimized Outcome: Adequate for Discharge   Problem: Pain Management: Goal: Pain level will decrease with appropriate interventions Outcome: Adequate for Discharge   Problem: Skin Integrity: Goal: Will show signs of wound healing Outcome: Adequate for Discharge   Problem: Coping: Goal: Level of anxiety will decrease Outcome: Adequate for Discharge   Problem: Safety: Goal: Ability to remain free from injury will improve Outcome: Adequate for Discharge

## 2019-01-03 NOTE — Progress Notes (Signed)
Physical Therapy Treatment Patient Details Name: Pamela Mason MRN: [id number] DOB: 08-11-1945 Today's Date: 01/03/2019    History of Present Illness 72 yo female s/p L DA-THA on 01/02/19. PMH includes visual deficits with history of multiple surgeries,dementia, RTC repair.    PT Comments    Progressing well with mobility. Reviewed/practiced exercises, gait training, and stair training. Issued HEP for pt to perform 2x/day until she follows up with surgeon. Okay to d/c from PT standpoint.    Follow Up Recommendations  Follow surgeon's recommendation for DC plan and follow-up therapies;Supervision for mobility/OOB     Equipment Recommendations  Rolling walker with 5" wheels;3in1 (PT)    Recommendations for Other Services       Precautions / Restrictions Precautions Precautions: Fall Restrictions Weight Bearing Restrictions: No Other Position/Activity Restrictions: WBAT    Mobility  Bed Mobility               General bed mobility comments: oob in recliner  Transfers Overall transfer level: Needs assistance Equipment used: Rolling walker (2 wheeled) Transfers: Sit to/from Stand Sit to Stand: Min guard         General transfer comment: for safety. VCs hand placement.  Ambulation/Gait Ambulation/Gait assistance: Min guard Gait Distance (Feet): 200 Feet Assistive device: Rolling walker (2 wheeled) Gait Pattern/deviations: Step-through pattern;Decreased stride length     General Gait Details: for safety. cues for pacing-not to get going too fast   Stairs Stairs: Yes Stairs assistance: Min guard Stair Management: Step to pattern;Forwards;One rail Left;With cane Number of Stairs: 5 General stair comments: up and over portable steps x 2. VCs safety, technique, sequence. Daughter present to observe.   Wheelchair Mobility    Modified Rankin (Stroke Patients Only)       Balance                                            Cognition  Arousal/Alertness: Awake/alert Behavior During Therapy: WFL for tasks assessed/performed Overall Cognitive Status: History of cognitive impairments - at baseline                                 General Comments: memory deficits      Exercises Total Joint Exercises Ankle Circles/Pumps: AROM;Both;10 reps;Seated Quad Sets: AROM;Both;10 reps;Seated Heel Slides: AROM;AAROM;Left;10 reps;Seated Hip ABduction/ADduction: AROM;Left;10 reps;Seated    General Comments        Pertinent Vitals/Pain Pain Assessment: 0-10 Pain Score: 3  Pain Location: L hip Pain Descriptors / Indicators: Sore Pain Intervention(s): Monitored during session;Ice applied    Home Living                      Prior Function            PT Goals (current goals can now be found in the care plan section) Progress towards PT goals: Progressing toward goals    Frequency    7X/week      PT Plan Current plan remains appropriate    Co-evaluation              AM-PAC PT "6 Clicks" Mobility   Outcome Measure  Help needed turning from your back to your side while in a flat bed without using bedrails?: A Little Help needed moving from lying on your back to sitting on the  side of a flat bed without using bedrails?: A Little Help needed moving to and from a bed to a chair (including a wheelchair)?: A Little Help needed standing up from a chair using your arms (e.g., wheelchair or bedside chair)?: A Little Help needed to walk in hospital room?: A Little Help needed climbing 3-5 steps with a railing? : A Little 6 Click Score: 18    End of Session Equipment Utilized During Treatment: Gait belt Activity Tolerance: Patient tolerated treatment well Patient left: in chair;with call bell/phone within reach;with family/visitor present   PT Visit Diagnosis: Difficulty in walking, not elsewhere classified (R26.2)     Time: [id number] PT Time Calculation (min) (ACUTE ONLY): 23  min  Charges:  $Gait Training: 8-22 mins $Therapeutic Exercise: 8-22 mins                        Weston Anna, Claude Pager: [phone] Office: [phone]

## 2019-01-06 LAB — TYPE AND SCREEN
ABO/RH(D): O NEG
Antibody Screen: NEGATIVE
Unit division: 0
Unit division: 0

## 2019-01-06 LAB — BPAM RBC
Blood Product Expiration Date: 202009122359
Blood Product Expiration Date: 202009122359
Unit Type and Rh: 9500
Unit Type and Rh: 9500

## 2019-01-19 ENCOUNTER — Other Ambulatory Visit (HOSPITAL_COMMUNITY)
Admission: RE | Admit: 2019-01-19 | Discharge: 2019-01-19 | Disposition: A | Discharge status: Home / Self Care | Payer: Medicare Other | Source: Ambulatory Visit | Attending: Neurology | Admitting: Neurology

## 2019-01-19 DIAGNOSIS — Z01812 Encounter for preprocedural laboratory examination: Secondary | ICD-10-CM | POA: Insufficient documentation

## 2019-01-19 DIAGNOSIS — Z20828 Contact with and (suspected) exposure to other viral communicable diseases: Secondary | ICD-10-CM | POA: Insufficient documentation

## 2019-01-19 LAB — SARS CORONAVIRUS 2 (TAT 6-24 HRS): SARS Coronavirus 2: NEGATIVE

## 2019-01-22 ENCOUNTER — Ambulatory Visit (INDEPENDENT_AMBULATORY_CARE_PROVIDER_SITE_OTHER): Payer: Medicare Other | Admitting: Neurology

## 2019-01-22 DIAGNOSIS — G4720 Circadian rhythm sleep disorder, unspecified type: Secondary | ICD-10-CM

## 2019-01-22 DIAGNOSIS — R9431 Abnormal electrocardiogram [ECG] [EKG]: Secondary | ICD-10-CM

## 2019-01-22 DIAGNOSIS — G4761 Periodic limb movement disorder: Secondary | ICD-10-CM

## 2019-01-22 DIAGNOSIS — G4733 Obstructive sleep apnea (adult) (pediatric): Secondary | ICD-10-CM

## 2019-01-22 DIAGNOSIS — G3184 Mild cognitive impairment, so stated: Secondary | ICD-10-CM

## 2019-01-24 ENCOUNTER — Other Ambulatory Visit: Payer: Self-pay

## 2019-01-25 ENCOUNTER — Encounter: Payer: Self-pay | Admitting: Gynecology

## 2019-01-25 ENCOUNTER — Ambulatory Visit (INDEPENDENT_AMBULATORY_CARE_PROVIDER_SITE_OTHER): Payer: Medicare Other | Admitting: Gynecology

## 2019-01-25 ENCOUNTER — Telehealth: Payer: Self-pay

## 2019-01-25 VITALS — BP 118/76 | Ht 63.0 in | Wt 115.0 lb

## 2019-01-25 DIAGNOSIS — Z01419 Encounter for gynecological examination (general) (routine) without abnormal findings: Secondary | ICD-10-CM

## 2019-01-25 DIAGNOSIS — N952 Postmenopausal atrophic vaginitis: Secondary | ICD-10-CM

## 2019-01-25 NOTE — Telephone Encounter (Signed)
I called pt to discuss her sleep study results. No answer, left a message asking her to call me back. 

## 2019-01-25 NOTE — Progress Notes (Signed)
Patient last seen on 10/23/18 in VV for [facility], diagnostic PSG on 11/29/18. Patient had a CPAP titration study on 01/22/19.  Please call and inform patient that I have entered an order for treatment with positive airway pressure (PAP) treatment for obstructive sleep apnea (OSA). She did fairly well during the latest sleep study with CPAP. We will, therefore, arrange for a machine for home use through a DME (durable medical equipment) company of Her choice; and I will see the patient back in follow-up in about 10 weeks. Please also explain to the patient that I will be looking out for compliance data, which can be downloaded from the machine (stored on an SD card, that is inserted in the machine) or via remote access through a modem, that is built into the machine. At the time of the followup appointment we will discuss sleep study results and how it is going with PAP treatment at home. Please advise patient to bring Her machine at the time of the first FU visit, even though this is cumbersome. Bringing the machine for every visit after that will likely not be needed, but often helps for the first visit to troubleshoot if needed. Please re-enforce the importance of compliance with treatment and the need for [place] to monitor compliance data - often an insurance requirement and actually good feedback for the patient as far as how they are doing.  Also remind patient, that any interim PAP machine or mask issues should be first addressed with the DME company, as they can often help better with technical and mask fit issues. Please ask if patient has a preference regarding DME company.  Please also make sure, the patient has a follow-up appointment with me in about 10 weeks from the setup date, thanks. May see one of our nurse practitioners if needed for proper timing of the FU appointment.  Please fax or rout report to the referring provider. Thanks,   Star Age, MD, PhD [facility])

## 2019-01-25 NOTE — Progress Notes (Signed)
    Alauna Urdiales [facility] Nov 03, 1945 [id number]        73 y.o.  [id number] for breast and pelvic exam.  Without gynecologic complaints.  Past medical history,surgical history, problem list, medications, allergies, family history and social history were all reviewed and documented as reviewed in the EPIC chart.  ROS:  Performed with pertinent positives and negatives included in the history, assessment and plan.   Additional significant findings : None   Exam: [name] assistant Vitals:   01/25/19 0831  BP: 118/76  Weight: 115 lb (52.2 kg)  Height: 5\' 3"  (1.6 m)   Body mass index is 20.37 kg/m.  General appearance:  Normal affect, orientation and appearance. Skin: Grossly normal HEENT: Without gross lesions.  No cervical or supraclavicular adenopathy. Thyroid normal.  Lungs:  Clear without wheezing, rales or rhonchi Cardiac: RR, without RMG Abdominal:  Soft, nontender, without masses, guarding, rebound, organomegaly or hernia Breasts:  Examined lying and sitting without masses, retractions, discharge or axillary adenopathy. Pelvic:  Ext, BUS, Vagina: With atrophic changes  Cervix: With atrophic changes  Uterus: Anteverted, normal size, shape and contour, midline and mobile nontender   Adnexa: Without masses or tenderness    Anus and perineum: Normal   Rectovaginal: Normal sphincter tone without palpated masses or tenderness.    Assessment/Plan:  73 y.o. [id number] female for breast and pelvic exam  1. Postmenopausal.  No significant menopausal symptoms or any vaginal bleeding. 2. Osteopenia by history.  Followed by Dr. Joylene Draft.  She is unclear when her last DEXA was performed.  She is going to ask him at her next visit and his recommendations for repeat.  She will continue to follow-up with him in reference to bone health. 3. Pap smear 2015.  No Pap smear done today.  No history of abnormal Pap smears.  We both agree to stop screening per current screening guidelines based on age. 4.  Colonoscopy 2018.  Repeat at their recommended interval. 5. Mammography 06/2018.  Continue with annual mammogram when due.  Breast exam normal today. 6. Health maintenance.  No routine lab work done as patient does this elsewhere.  Follow-up 1 year, sooner as needed.   Anastasio Auerbach MD, 9:00 AM 01/25/2019

## 2019-01-25 NOTE — Telephone Encounter (Signed)
Pt returned call. Please call back when available. 

## 2019-01-25 NOTE — Telephone Encounter (Signed)
-----   Message from Star Age, MD sent at 01/25/2019  8:56 AM EDT ----- Patient last seen on 10/23/18 in VV for [facility], diagnostic PSG on 11/29/18. Patient had a CPAP titration study on 01/22/19.  Please call and inform patient that I have entered an order for treatment with positive airway pressure (PAP) treatment for obstructive sleep apnea (OSA). She did fairly well during the latest sleep study with CPAP. We will, therefore, arrange for a machine for home use through a DME (durable medical equipment) company of Her choice; and I will see the patient back in follow-up in about 10 weeks. Please also explain to the patient that I will be looking out for compliance data, which can be downloaded from the machine (stored on an SD card, that is inserted in the machine) or via remote access through a modem, that is built into the machine. At the time of the followup appointment we will discuss sleep study results and how it is going with PAP treatment at home. Please advise patient to bring Her machine at the time of the first FU visit, even though this is cumbersome. Bringing the machine for every visit after that will likely not be needed, but often helps for the first visit to troubleshoot if needed. Please re-enforce the importance of compliance with treatment and the need for [place] to monitor compliance data - often an insurance requirement and actually good feedback for the patient as far as how they are doing.  Also remind patient, that any interim PAP machine or mask issues should be first addressed with the DME company, as they can often help better with technical and mask fit issues. Please ask if patient has a preference regarding DME company.  Please also make sure, the patient has a follow-up appointment with me in about 10 weeks from the setup date, thanks. May see one of our nurse practitioners if needed for proper timing of the FU appointment.  Please fax or rout report to the referring provider. Thanks,    Star Age, MD, PhD [facility])

## 2019-01-25 NOTE — Procedures (Signed)
PATIENT'S NAME:  Pamela Mason, Pamela Mason DOB:      Jun 30, 1945      MR#:    [id number]     DATE OF RECORDING: 01/22/2019 REFERRING M.D.:  Crist Infante MD Study Performed:   CPAP  Titration HISTORY: 73 year old woman with a history of hypertension, osteopenia, hyperlipidemia, depression, and anxiety, who returns for a full night titration study. Her baseline sleep study from 11/29/18 showed moderate to severe obstructive sleep apnea, with a total AHI of 16.4/hour, REM AHI of 78.5/hour, supine AHI of 17.2/hour and O2 nadir of 82%. The patient endorsed the Epworth Sleepiness Scale at 2/24 points. The patient's weight 130 pounds with a height of 64 (inches), resulting in a BMI of 22.2 kg/m2.   CURRENT MEDICATIONS: Vitamin B12, Razadyne, Prinivil, Flintstones plus iron, Zoloft, Vitamin D  PROCEDURE:  This is a multichannel digital polysomnogram utilizing the SomnoStar 11.2 system.  Electrodes and sensors were applied and monitored per AASM Specifications.   EEG, EOG, Chin and Limb EMG, were sampled at 200 Hz.  ECG, Snore and Nasal Pressure, Thermal Airflow, Respiratory Effort, CPAP Flow and Pressure, Oximetry was sampled at 50 Hz. Digital video and audio were recorded.      The patient was fitted with a small Dreamwear FFM. CPAP was initiated at 5 cmH20 with heated humidity per AASM standards and pressure was advanced to 6 cmH20 because of hypopneas, apneas and desaturations.  At a PAP pressure of 6 cmH20, there was a reduction of the AHI to 0/hour with supine REM sleep achieved and O2 nadir of 94%.   Lights Out was at 21:27 and Lights On at 04:57. Total recording time (TRT) was 451 minutes, with a total sleep time (TST) of 179 minutes. The patient's sleep latency was 133.5 minutes, which is markedly delayed. REM latency was 321 minutes, which is markedly delayed. The sleep efficiency was 39.7%, which is markedly reduced.    SLEEP ARCHITECTURE: WASO (Wake after sleep onset) was 167 minutes with moderate sleep  fragmentation noted. There were 7 minutes in Stage N1, 95.5 minutes Stage N2, 52 minutes Stage N3 and 24.5 minutes in Stage REM.  The percentage of Stage N1 was 3.9%, Stage N2 was 53.4%, Stage N3 was 29.1%, which is increased, and Stage R (REM sleep) was 13.7%, which is reduced. The arousals were noted as: 43 were spontaneous, 15 were associated with PLMs, 0 were associated with respiratory events.  RESPIRATORY ANALYSIS:  There was a total of 0 respiratory events: 0 obstructive apneas, 0 central apneas and 0 mixed apneas with a total of 0 apneas and an apnea index (AI) of 0 /hour. There were 0 hypopneas with a hypopnea index of 0/hour. The patient also had 0 respiratory event related arousals (RERAs).     The total APNEA/HYPOPNEA INDEX  (AHI) was 0 /hour and the total RESPIRATORY DISTURBANCE INDEX was 0 /hour  0 events occurred in REM sleep and 0 events in NREM. The REM AHI was 0 /hour versus a non-REM AHI of 0 /hour.  The patient spent 132 minutes of total sleep time in the supine position and 47 minutes in non-supine. The supine AHI was 0.0, versus a non-supine AHI of 0.0.  OXYGEN SATURATION & C02:  The baseline 02 saturation was 96%, with the lowest being 92%. Time spent below 89% saturation equaled 0 minutes.  PERIODIC LIMB MOVEMENTS:  The patient had a total of 35 Periodic Limb Movements. The Periodic Limb Movement (PLM) index was 11.7 and the PLM Arousal index was  5. /hour.  Audio and video analysis did not show any abnormal or unusual movements, behaviors, phonations or vocalizations. The patient took bathroom breaks. The EKG was in keeping with normal sinus rhythm.  Post-study, the patient indicated that sleep was the same as usual.   IMPRESSION: 1. Obstructive Sleep Apnea (OSA) 2. Dysfunctions associated with sleep stages or arousal from sleep  RECOMMENDATIONS: 1. This study demonstrates reduction of the patient's obstructive sleep apnea with CPAP therapy. The patient did not sleep very  much or very well. Nevertheless, I recommend CPAP treatment at home at a pressure of 6 cm via small FFM with heated humidity. The patient should be reminded to be fully compliant with PAP therapy to improve sleep related symptoms and decrease long term cardiovascular risks. The patient should be reminded, that it may take up to 3 months to get fully used to using PAP with all planned sleep. The earlier full compliance is achieved, the better long term compliance tends to be. Please note that untreated obstructive sleep apnea may carry additional perioperative morbidity. Patients with significant obstructive sleep apnea should receive perioperative PAP therapy and the surgeons and particularly the anesthesiologist should be informed of the diagnosis and the severity of the sleep disordered breathing. 2. This study shows sleep fragmentation and abnormal sleep stage percentages; these are nonspecific findings and per se do not signify an intrinsic sleep disorder or a cause for the patient's sleep-related symptoms. Causes include (but are not limited to) the first night effect of the sleep study, circadian rhythm disturbances, medication effect or an underlying mood disorder or medical problem.  3. The patient should be cautioned not to drive, work at heights, or operate dangerous or heavy equipment when tired or sleepy. Review and reiteration of good sleep hygiene measures should be pursued with any patient.  4. The patient will be seen in follow-up in the sleep clinic at [facility] for discussion of the test results, symptom and treatment compliance review, further management strategies, etc. The referring provider will be notified of the test results.   I certify that I have reviewed the entire raw data recording prior to the issuance of this report in accordance with the Standards of Accreditation of the American Academy of Sleep Medicine (AASM)   Star Age, MD, PhD Diplomat, American Board of Neurology and Sleep  Medicine (Neurology and Sleep Medicine)

## 2019-01-25 NOTE — Patient Instructions (Signed)
Check with Dr. Joylene Draft about when you are last bone density was done and his recommendations as to when it needs to be repeated.  Follow-up in 1 year for annual exam

## 2019-01-25 NOTE — Telephone Encounter (Signed)
I called pt. I advised pt that Dr. Rexene Alberts reviewed their sleep study results and found that pt did fairly well with the cpap during her latest sleep study. Dr. Rexene Alberts recommends that pt start a cpap at home. I reviewed PAP compliance expectations with the pt. Pt is agreeable to starting a CPAP. I advised pt that an order will be sent to a DME, [facility], and [facility] will call the pt within about one week after they file with the pt's insurance. [facility] will show the pt how to use the machine, fit for masks, and troubleshoot the CPAP if needed. A follow up appt was made for insurance purposes with [name], NP on 04/02/19 at 8:00am. Pt verbalized understanding to arrive 15 minutes early and bring their CPAP. A letter with all of this information in it will be mailed to the pt as a reminder. I verified with the pt that the address we have on file is correct. Pt verbalized understanding of results. Pt had no questions at this time but was encouraged to call back if questions arise. I have sent the order to [facility] and have received confirmation that they have received the order.

## 2019-01-25 NOTE — Addendum Note (Signed)
Addended by: Star Age on: 01/25/2019 08:56 AM   Modules accepted: Orders

## 2019-01-25 NOTE — Progress Notes (Signed)
cpap

## 2019-01-31 DIAGNOSIS — F331 Major depressive disorder, recurrent, moderate: Secondary | ICD-10-CM | POA: Diagnosis not present

## 2019-02-03 DIAGNOSIS — Z23 Encounter for immunization: Secondary | ICD-10-CM | POA: Diagnosis not present

## 2019-02-14 DIAGNOSIS — Z471 Aftercare following joint replacement surgery: Secondary | ICD-10-CM | POA: Diagnosis not present

## 2019-02-14 DIAGNOSIS — Z96642 Presence of left artificial hip joint: Secondary | ICD-10-CM | POA: Diagnosis not present

## 2019-02-14 DIAGNOSIS — M1712 Unilateral primary osteoarthritis, left knee: Secondary | ICD-10-CM | POA: Diagnosis not present

## 2019-02-15 ENCOUNTER — Encounter: Payer: Self-pay | Admitting: Gynecology

## 2019-04-02 ENCOUNTER — Ambulatory Visit: Payer: Self-pay | Admitting: Family Medicine

## 2019-04-06 NOTE — Progress Notes (Deleted)
PATIENT: Pamela Mason DOB: 01/03/1946  REASON FOR VISIT: follow up HISTORY FROM: patient  No chief complaint on file.    HISTORY OF PRESENT ILLNESS: Today 04/06/19 Pamela Mason is a 73 y.o. female here today for follow up for memory loss and OSA on CPAP.    HISTORY: (copied from Dr Guadelupe Sabin note on 10/23/2018)  Interim history:   Pamela Mason is a 73 year old right-handed woman with an underlying medical history of hypertension, osteopenia, hyperlipidemia, depression, and anxiety, who presents for a virtual, video based appointment via [email or web address] for follow-up consultation of her memory loss.  The patient is unaccompanied today and joins via cell phone from home, I am located in my office.  I last saw her in the office on 04/20/2018, at which time she was on Razadyne ER 8 mg daily.  Her MMSE was 30 at the time.  She was driving, she was living independently. We talked about potentially repeating her neuropsychological testing.  Prior neuropsychological evaluation was in keeping with mild cognitive impairment.  I also talked to her about potentially doing a sleep study to rule out obstructive sleep apnea.  Today, 10/23/2018: Please also see below for virtual visit documentation.     The patient's allergies, current medications, family history, past medical history, past social history, past surgical history and problem list were reviewed and updated as appropriate.  Previously:   I first met her on 10/19/2013 at the request of her primary care physician, at which time she reported forgetfulness and her kids had noticed that she would ask the same questions over and over again. She also had neuropsychological testing prior to the visit with me which supported the diagnosis of mild cognitive impairment. She had been on galantamine 8 mg daily. She had a prior MRI in June 2018 which did show mild to moderate atrophy. We had talked about her snoring at the time and considered  a sleep study.   10/19/2017: (She) reports short-term memory problems for the past year or so, noted primarily by her children as she was forgetful and asks the same questions over again. I reviewed your office note from3/25/19, which you kindly included. She had neuropsychological evaluation with Dr. Jefm Miles in September 2018 which indicated mild cognitive impairment and depression/anxiety, mild early dementia not excluded. Retesting was recommended in 6-9 months. She has been on galantamine 8 mg strength. She is on oral vitamin B12 supplement. For her depression she is on Prozac which was increased last year. She is widowed and lives alone. She is retired. She finished college. She has 3 children, smoked briefly in the 46s but quit in 67. She drinks alcohol in the form of wine, 1-2 glasses every other day or so. She does not have a history of alcohol dependence. She drinks caffeine in the form of coffee and soda. She had a brain MRI without contrast on 11/01/2016 and I reviewed the results: IMPRESSION: 1. No acute intracranial abnormality or mass. 2. Mild-to-moderate cerebral atrophy.  She tries to hydrate well with water. She sleeps well. She tries to be in bed around 10 and rise time is between 6 and 7. Shes not sure if she snores. Her husband died at age 59 about 88 years ago. She does not have a BF or significant other. Her son, Pamela Mason is the oldest and lives in [place]. She is comfortable driving to [place] and [place], [place] where her youngest daughter Pamela Mason lives. Her middle daughter Pamela Mason lives in [place] and  she flies to [place], she recently returned from a visit. She has 7 grandchildren. She has not noticed any difficulty driving and uses a GPS successfully. She drove herself to this appointment. She has a follow-up appointment with Dr. Jefm Miles in July. She sees her psychiatrist on a regular basis, Dr. Aline August out of [place]. She was changed to Pristiq, and is now in  the process of tapering off of Pristiq and starting sertraline titration. She has no family history of dementia, but she is not aware of her fathers side of the history, her parents were divorced and she did not know her father after that. Her mother lived to be 73 years old and died from cancer. She has 1 sister, Pamela Mason who is 3 years younger and one brother, Pamela Mason who is 4 years younger, neither sibling with memory loss as far she knows. Her weight has remained stable. 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 She has been on galantamine since last year but does not know how long it has been. She tries to exercise in the form of walking in her neighborhood.   REVIEW OF SYSTEMS: Out of a complete 14 system review of symptoms, the patient complains only of the following symptoms, and all other reviewed systems are negative.  ALLERGIES: Allergies  Allergen Reactions   Latex     Pt stated she had a reaction to latex in the past. H. Mickley RN   Tape Other (See Comments)    redness    HOME MEDICATIONS: Outpatient Medications Prior to Visit  Medication Sig Dispense Refill   docusate sodium (COLACE) 100 MG capsule Take 1 capsule (100 mg total) by mouth 2 (two) times daily. 28 capsule 0   ezetimibe (ZETIA) 10 MG tablet Take 10 mg by mouth every evening.     ferrous sulfate (FERROUSUL) 325 (65 FE) MG tablet Take 1 tablet (325 mg total) by mouth 3 (three) times daily with meals for 14 days. 42 tablet 0   galantamine (RAZADYNE ER) 8 MG 24 hr capsule Take 8 mg by mouth daily with lunch.      lisinopril (PRINIVIL,ZESTRIL) 10 MG tablet Take 10 mg by mouth every evening.   11   Pediatric Multivitamins-Iron (FLINTSTONES PLUS IRON PO) Take 1 tablet by mouth daily with lunch.      polyethylene glycol (MIRALAX / GLYCOLAX) 17 g packet Take 17 g by mouth 2 (two) times daily. 28 packet 0   sertraline (ZOLOFT) 50 MG tablet Take 100 mg by mouth daily.      simvastatin (ZOCOR) 40 MG tablet  Take 40 mg by mouth every evening.     vitamin B-12 (CYANOCOBALAMIN) 500 MCG tablet Take 500 mcg by mouth daily.     Vitamin D, Ergocalciferol, (DRISDOL) 1.25 MG (50000 UT) CAPS capsule Take 50,000 Units by mouth every Sunday.      No facility-administered medications prior to visit.     PAST MEDICAL HISTORY: Past Medical History:  Diagnosis Date   Anxiety    Arthritis    Complication of anesthesia    slow to wake up   Dementia ([place]) 2018   memory loss   Depression    Hyperlipidemia    Hypertension    Osteopenia    Sleep apnea     PAST SURGICAL HISTORY: Past Surgical History:  Procedure Laterality Date   COLONOSCOPY     INTRACAPSULAR CATARACT EXTRACTION     ROTATOR CUFF REPAIR     TOTAL HIP ARTHROPLASTY Left 01/02/2019  Procedure: TOTAL HIP ARTHROPLASTY ANTERIOR APPROACH;  Surgeon: Paralee Cancel, MD;  Location: [facility];  Service: Orthopedics;  Laterality: Left;  70 mins   TUBAL LIGATION      FAMILY HISTORY: Family History  Problem Relation Age of Onset   Hypertension Mother    Ovarian cancer Mother    Heart disease Father    Colon cancer Neg Hx     SOCIAL HISTORY: Social History   Socioeconomic History   Marital status: Widowed    Spouse name: Not on file   Number of children: Not on file   Years of education: Not on file   Highest education level: Not on file  Occupational History   Not on file  Social Needs   Financial resource strain: Not on file   Food insecurity    Worry: Not on file    Inability: Not on file   Transportation needs    Medical: Not on file    Non-medical: Not on file  Tobacco Use   Smoking status: Former Smoker    Packs/day: 0.25    Years: 4.00    Pack years: 1.00    Types: Cigarettes   Smokeless tobacco: Never Used  Substance and Sexual Activity   Alcohol use: Yes    Alcohol/week: 7.0 standard drinks    Types: 7 Glasses of wine per week    Comment: wine daily   Drug use: No   Sexual  activity: Not Currently    Birth control/protection: Post-menopausal    Comment: 1st intercourse 43 yrs-Fewer than 5 partners  Lifestyle   Physical activity    Days per week: Not on file    Minutes per session: Not on file   Stress: Not on file  Relationships   Social connections    Talks on phone: Not on file    Gets together: Not on file    Attends religious service: Not on file    Active member of club or organization: Not on file    Attends meetings of clubs or organizations: Not on file    Relationship status: Not on file   Intimate partner violence    Fear of current or ex partner: Not on file    Emotionally abused: Not on file    Physically abused: Not on file    Forced sexual activity: Not on file  Other Topics Concern   Not on file  Social History Narrative   Not on file      PHYSICAL EXAM  There were no vitals filed for this visit. There is no height or weight on file to calculate BMI.  Generalized: Well developed, in no acute distress  Cardiology: normal rate and rhythm, no murmur noted Neurological examination  Mentation: Alert oriented to time, place, history taking. Follows all commands speech and language fluent Cranial nerve II-XII: Pupils were equal round reactive to light. Extraocular movements were full, visual field were full on confrontational test. Facial sensation and strength were normal. Uvula tongue midline. Head turning and shoulder shrug  were normal and symmetric. Motor: The motor testing reveals 5 over 5 strength of all 4 extremities. Good symmetric motor tone is noted throughout.  Sensory: Sensory testing is intact to soft touch on all 4 extremities. No evidence of extinction is noted.  Coordination: Cerebellar testing reveals good finger-nose-finger and heel-to-shin bilaterally.  Gait and station: Gait is normal. Tandem gait is normal. Romberg is negative. No drift is seen.  Reflexes: Deep tendon reflexes are symmetric and normal  bilaterally.   DIAGNOSTIC DATA (LABS, IMAGING, TESTING) - I reviewed patient records, labs, notes, testing and imaging myself where available.  MMSE - Mini Mental State Exam 04/20/2018  Orientation to time 5  Orientation to Place 5  Registration 3  Attention/ Calculation 5  Recall 3  Language- name 2 objects 2  Language- repeat 1  Language- follow 3 step command 3  Language- read & follow direction 1  Write a sentence 1  Copy design 1  Total score 30     Lab Results  Component Value Date   WBC 6.8 01/03/2019   HGB 10.0 (L) 01/03/2019   HCT 31.0 (L) 01/03/2019   MCV 101.3 (H) 01/03/2019   PLT 213 01/03/2019      Component Value Date/Time   NA 138 01/03/2019 0242   K 3.9 01/03/2019 0242   CL 107 01/03/2019 0242   CO2 25 01/03/2019 0242   GLUCOSE 115 (H) 01/03/2019 0242   BUN 14 01/03/2019 0242   CREATININE 0.69 01/03/2019 0242   CALCIUM 8.3 (L) 01/03/2019 0242   GFRNONAA >60 01/03/2019 0242   GFRAA >60 01/03/2019 0242   No results found for: CHOL, HDL, LDLCALC, LDLDIRECT, TRIG, CHOLHDL No results found for: HGBA1C No results found for: VITAMINB12 No results found for: TSH     ASSESSMENT AND PLAN 73 y.o. year old female  has a past medical history of Anxiety, Arthritis, Complication of anesthesia, Dementia ([place]) (2018), Depression, Hyperlipidemia, Hypertension, Osteopenia, and Sleep apnea. here with ***  No diagnosis found.     No orders of the defined types were placed in this encounter.    No orders of the defined types were placed in this encounter.     I spent 15 minutes with the patient. 50% of this time was spent counseling and educating patient on plan of care and medications.    Debbora Presto, FNP-C 04/06/2019, 10:06 PM [facility] [address] [place], [place] [address] [phone]

## 2019-04-09 ENCOUNTER — Ambulatory Visit: Payer: Self-pay | Admitting: Family Medicine

## 2019-04-11 ENCOUNTER — Telehealth: Payer: Self-pay

## 2019-04-11 NOTE — Telephone Encounter (Signed)
Called to inform pt that [name] would not be in that morning and r/s her appt.

## 2019-04-12 ENCOUNTER — Ambulatory Visit: Payer: Self-pay | Admitting: Family Medicine

## 2019-04-30 DIAGNOSIS — E7849 Other hyperlipidemia: Secondary | ICD-10-CM | POA: Diagnosis not present

## 2019-04-30 DIAGNOSIS — I10 Essential (primary) hypertension: Secondary | ICD-10-CM | POA: Diagnosis not present

## 2019-04-30 DIAGNOSIS — E538 Deficiency of other specified B group vitamins: Secondary | ICD-10-CM | POA: Diagnosis not present

## 2019-05-15 ENCOUNTER — Other Ambulatory Visit: Payer: Self-pay | Admitting: Internal Medicine

## 2019-05-15 DIAGNOSIS — Z1231 Encounter for screening mammogram for malignant neoplasm of breast: Secondary | ICD-10-CM

## 2019-05-18 ENCOUNTER — Telehealth: Payer: Self-pay | Admitting: Neurology

## 2019-05-18 NOTE — Telephone Encounter (Signed)
Received the following communication thread between [facility] in regards to this patient. Since she didn't meet compliance we would have to start the process all over for the patient by repeating a in office visit and then repeat sleep study and get patient reestablished with the machine.   " please reach out to patient and ask that she return it to the [place] location. She has not met compliance, at any point, over the last 90 days, so insurance is no longer paying. If she wants to restart her therapy, she will need to go back to the doctor and then have a repeat in lab study."  "Hey Just wanted to follow up and let you know that I called her and she seemed pretty upset. She said that she has been using the machine. After looking at her compliance I tried to explain to her that yes she has been using but unfortunately she has not been using it for the required 4 hrs. She would like to see what she needs to do to try to get set back up again. She did say that she will return it today."   "please let physician know pt will need new notes and a new in lab study to restart."

## 2019-05-21 NOTE — Telephone Encounter (Signed)
Called the patient's daughter as requested by the patient. She states patient has had so much anxiety and concerns over this machine. Advised the patient never completed her initial CPAP compliance visit at which point we would have reviewed her download and information with her to address concerns that she was having. Advised that I could see where she has been consistent with using the machine most nights but because it was not over 4 hrs each night that is what made it considered to be non compliant. She asked if I was able to know what the patient was doing as far as if she was taking it off and just not wearing or turning it off. As I was looking at the download I see where patient has been having anywhere from 6-8 AHI events at the current pressure. Advised it could be that she is stopping it because she is not fully having the benefit of the machine helping with treating her apnea since events are occurring. Informed that had we seen her in the initial compliance visit we would have discussed this at that time and attempt to correct this for her. The daughter was leary about her having to repeat the whole process due to the trouble she has had with it. I advised that if she is concerned about her memory, this being left untreated can cause the memory concerns to worsen. She verbalized understanding of the patient coming in on wed with NP and having the study repeated. She questioned if patient could have a HST vs having to come back in the lab. Advised the patient daughter that is also dictated by her insurance and what they will cover.

## 2019-05-21 NOTE — Telephone Encounter (Signed)
Pt has called to speak with RN Myriam Jacobson re: what she needs to do about being re tested since she failed to meet compliance standards.  Pt is asking to be called

## 2019-05-21 NOTE — Telephone Encounter (Signed)
Called the patient to advise that per medicare guidelines if not using the machine >4 hrs each night then it meets not compliant. Even though she used the machine 25/30 days only 6 of those days were greater the 4 days. Advised the patient that due to that not meeting the compliance guidelines medicare has the process restart. I have scheduled her with a follow up visit with Ward Givens, NP in which patient would need to have the sleep studies repeated to allow the process to restart. Patient verbalized understanding. Patient asked if I call her daughter and review this information with her due to her having memory concerns. Her daughter was not listed on the last DPR but her name and number is in the system as a emergency contact and per patient given verbal permission, advised I would inform the daughter. Attempted to call the daughter and their was a busy signal.

## 2019-05-23 ENCOUNTER — Other Ambulatory Visit: Payer: Self-pay

## 2019-05-23 ENCOUNTER — Ambulatory Visit (INDEPENDENT_AMBULATORY_CARE_PROVIDER_SITE_OTHER): Payer: Medicare Other | Admitting: Adult Health

## 2019-05-23 ENCOUNTER — Encounter: Payer: Self-pay | Admitting: Adult Health

## 2019-05-23 VITALS — BP 110/72 | HR 81 | Temp 97.7°F | Ht 63.0 in | Wt 119.2 lb

## 2019-05-23 DIAGNOSIS — G4733 Obstructive sleep apnea (adult) (pediatric): Secondary | ICD-10-CM

## 2019-05-23 DIAGNOSIS — G3184 Mild cognitive impairment, so stated: Secondary | ICD-10-CM

## 2019-05-23 NOTE — Progress Notes (Addendum)
PATIENT: Pamela Mason DOB: 07-22-1945  REASON FOR VISIT: follow up HISTORY FROM: patient  HISTORY OF PRESENT ILLNESS: Today 05/23/19:  Pamela Mason is a 74 year old female with a history of mild cognitive impairment as well as obstructive sleep apnea on CPAP.  She feels that her memory has remained stable.  She lives at home alone.  She is able to complete all chores and complete ADLs independently.  She does operate a motor vehicle without difficulty.  She manages her own finances.  She reports that her son has made her notebooks to help her remember things.  She denies any changes in her mood or behavior.  She states that she did start CPAP however she was not using it enough to meet insurance requirements and her DME company took the machine back.  She would like to get restarted on CPAP.  She returns today for an evaluation.  HISTORY (copied from Dr. Guadelupe Sabin note)  She reports feeling stable with regards to her memory, she continues to take generic Razadyne ER 8 mg once daily, prescription is up-to-date through primary care.  She has had no recent illness thankfully, no falls.  She does snore, she would be willing to pursue a sleep study to rule out obstructive sleep apnea.  She has no telltale morning headaches or night to night nocturia.  She has no obvious family history of sleep apnea. Her Epworth sleepiness score is 2 out of 24, fatigue severity score is 23 out of 63.  She is typically in bed between 9 and 10 and rise time is pets, she does have a TV in the bedroom and sometimes watches it before falling asleep but turns it off.  She has been driving without difficulty, has limited her driving currently because of the stay at home recommendation.  She is pursuing social distancing.  She has a daughter in [place] who has 3 kids, another daughter in [place] with 2 kids, son is in [place], also with 2 kids.   REVIEW OF SYSTEMS: Out of a complete 14 system review  of symptoms, the patient complains only of the following symptoms, and all other reviewed systems are negative.  See HPI  ALLERGIES: Allergies  Allergen Reactions  . Latex     Pt stated she had a reaction to latex in the past. [name] RN  . Tape Other (See Comments)    redness    HOME MEDICATIONS: Outpatient Medications Prior to Visit  Medication Sig Dispense Refill  . docusate sodium (COLACE) 100 MG capsule Take 1 capsule (100 mg total) by mouth 2 (two) times daily. 28 capsule 0  . galantamine (RAZADYNE ER) 8 MG 24 hr capsule Take 8 mg by mouth daily with lunch.     . lisinopril (PRINIVIL,ZESTRIL) 10 MG tablet Take 10 mg by mouth every evening.   11  . Pediatric Multivitamins-Iron (FLINTSTONES PLUS IRON PO) Take 1 tablet by mouth daily with lunch.     . potassium chloride SA (KLOR-CON) 20 MEQ tablet Take 20 mEq by mouth daily.    . sertraline (ZOLOFT) 50 MG tablet Take 100 mg by mouth daily.     . simvastatin (ZOCOR) 40 MG tablet Take 40 mg by mouth every evening.    . vitamin B-12 (CYANOCOBALAMIN) 500 MCG tablet Take 500 mcg by mouth daily.    . Vitamin D, Ergocalciferol, (DRISDOL) 1.25 MG (50000 UT) CAPS capsule Take 50,000 Units by mouth every Sunday.     . polyethylene  glycol (MIRALAX / GLYCOLAX) 17 g packet Take 17 g by mouth 2 (two) times daily. (Patient not taking: Reported on 05/23/2019) 28 packet 0  . ezetimibe (ZETIA) 10 MG tablet Take 10 mg by mouth every evening.    . ferrous sulfate (FERROUSUL) 325 (65 FE) MG tablet Take 1 tablet (325 mg total) by mouth 3 (three) times daily with meals for 14 days. 42 tablet 0   No facility-administered medications prior to visit.    PAST MEDICAL HISTORY: Past Medical History:  Diagnosis Date  . Anxiety   . Arthritis   . Complication of anesthesia    slow to wake up  . Dementia ([place]) 2018   memory loss  . Depression   . Hyperlipidemia   . Hypertension   . Osteopenia   . Sleep apnea     PAST SURGICAL HISTORY: Past  Surgical History:  Procedure Laterality Date  . COLONOSCOPY    . INTRACAPSULAR CATARACT EXTRACTION    . ROTATOR CUFF REPAIR    . TOTAL HIP ARTHROPLASTY Left 01/02/2019   Procedure: TOTAL HIP ARTHROPLASTY ANTERIOR APPROACH;  Surgeon: Paralee Cancel, MD;  Location: [facility];  Service: Orthopedics;  Laterality: Left;  70 mins  . TUBAL LIGATION      FAMILY HISTORY: Family History  Problem Relation Age of Onset  . Hypertension Mother   . Ovarian cancer Mother   . Heart disease Father   . Colon cancer Neg Hx     SOCIAL HISTORY: Social History   Socioeconomic History  . Marital status: Widowed    Spouse name: Not on file  . Number of children: Not on file  . Years of education: Not on file  . Highest education level: Not on file  Occupational History  . Not on file  Tobacco Use  . Smoking status: Former Smoker    Packs/day: 0.25    Years: 4.00    Pack years: 1.00    Types: Cigarettes  . Smokeless tobacco: Never Used  Substance and Sexual Activity  . Alcohol use: Yes    Alcohol/week: 7.0 standard drinks    Types: 7 Glasses of wine per week    Comment: wine daily  . Drug use: No  . Sexual activity: Not Currently    Birth control/protection: Post-menopausal    Comment: 1st intercourse 25 yrs-Fewer than 5 partners  Other Topics Concern  . Not on file  Social History Narrative  . Not on file   Social Determinants of Health   Financial Resource Strain:   . Difficulty of Paying Living Expenses: Not on file  Food Insecurity:   . Worried About Charity fundraiser in the Last Year: Not on file  . Ran Out of Food in the Last Year: Not on file  Transportation Needs:   . Lack of Transportation (Medical): Not on file  . Lack of Transportation (Non-Medical): Not on file  Physical Activity:   . Days of Exercise per Week: Not on file  . Minutes of Exercise per Session: Not on file  Stress:   . Feeling of Stress : Not on file  Social Connections:   . Frequency of Communication  with Friends and Family: Not on file  . Frequency of Social Gatherings with Friends and Family: Not on file  . Attends Religious Services: Not on file  . Active Member of Clubs or Organizations: Not on file  . Attends Archivist Meetings: Not on file  . Marital Status: Not on file  Intimate Partner Violence:   . Fear of Current or Ex-Partner: Not on file  . Emotionally Abused: Not on file  . Physically Abused: Not on file  . Sexually Abused: Not on file      PHYSICAL EXAM  Vitals:   05/23/19 1241  BP: 110/72  Pulse: 81  Temp: 97.7 F (36.5 C)  TempSrc: Oral  Weight: 119 lb 3.2 oz (54.1 kg)  Height: 5\' 3"  (1.6 m)   Body mass index is 21.12 kg/m.   MMSE - Mini Mental State Exam 05/23/2019 04/20/2018  Not completed: (No Data) -  Orientation to time 5 5  Orientation to Place 5 5  Registration 3 3  Attention/ Calculation 5 5  Recall 1 3  Language- name 2 objects 2 2  Language- repeat 1 1  Language- follow 3 step command 3 3  Language- read & follow direction 1 1  Write a sentence 1 1  Copy design 1 1  Total score 28 30     Generalized: Well developed, in no acute distress   Neurological examination  Mentation: Alert oriented to time, place, history taking. Follows all commands speech and language fluent Cranial nerve II-XII: Pupils were equal round reactive to light. Extraocular movements were full, visual field were full on confrontational test. Facial sensation and strength were normal. Uvula tongue midline. Head turning and shoulder shrug  were normal and symmetric. Motor: The motor testing reveals 5 over 5 strength of all 4 extremities. Good symmetric motor tone is noted throughout.  Sensory: Sensory testing is intact to soft touch on all 4 extremities. No evidence of extinction is noted.  Coordination: Cerebellar testing reveals good finger-nose-finger and heel-to-shin bilaterally.  Gait and station: Gait is normal.     DIAGNOSTIC DATA (LABS,  IMAGING, TESTING) - I reviewed patient records, labs, notes, testing and imaging myself where available.  Lab Results  Component Value Date   WBC 6.8 01/03/2019   HGB 10.0 (L) 01/03/2019   HCT 31.0 (L) 01/03/2019   MCV 101.3 (H) 01/03/2019   PLT 213 01/03/2019      Component Value Date/Time   NA 138 01/03/2019 0242   K 3.9 01/03/2019 0242   CL 107 01/03/2019 0242   CO2 25 01/03/2019 0242   GLUCOSE 115 (H) 01/03/2019 0242   BUN 14 01/03/2019 0242   CREATININE 0.69 01/03/2019 0242   CALCIUM 8.3 (L) 01/03/2019 0242   GFRNONAA >60 01/03/2019 0242   GFRAA >60 01/03/2019 0242      ASSESSMENT AND PLAN 74 y.o. year old female  has a past medical history of Anxiety, Arthritis, Complication of anesthesia, Dementia ([place]) (2018), Depression, Hyperlipidemia, Hypertension, Osteopenia, and Sleep apnea. here with:  1.  Memory disturbance 2.  Obstructive sleep apnea  Patient's memory score has remained stable.  She will continue on galantamine.  She would like to get restarted on CPAP therapy.  She will have to repeat a home sleep test.  This has been ordered.  She will follow-up after her sleep test.  Patient voiced understanding.  She will call if she develops new symptoms or have any additional questions.    Ward Givens, MSN, NP-C 05/23/2019, 12:55 PM [facility] [address] [place], [place] [address] [phone]  I reviewed the above note and documentation by the Nurse Practitioner and agree with the history, exam, assessment and plan as outlined above. I was available for consultation. Star Age, MD, PhD [facility])

## 2019-05-23 NOTE — Patient Instructions (Signed)
Your Plan:  Continue Razadyne  Memory score is stable  Repeat sleep test to get started on CPAP If your symptoms worsen or you develop new symptoms please let us know.   Thank you for coming to see [place] at [facility]. I hope we have been able to provide you high quality care today.  You may receive a patient satisfaction survey over the next few weeks. We would appreciate your feedback and comments so that we may continue to improve ourselves and the health of our patients.

## 2019-05-24 ENCOUNTER — Other Ambulatory Visit: Payer: Self-pay | Admitting: Neurology

## 2019-05-24 DIAGNOSIS — R0683 Snoring: Secondary | ICD-10-CM

## 2019-05-24 DIAGNOSIS — G4733 Obstructive sleep apnea (adult) (pediatric): Secondary | ICD-10-CM

## 2019-05-24 DIAGNOSIS — G3184 Mild cognitive impairment, so stated: Secondary | ICD-10-CM

## 2019-05-30 ENCOUNTER — Telehealth: Payer: Self-pay

## 2019-05-30 DIAGNOSIS — G4761 Periodic limb movement disorder: Secondary | ICD-10-CM

## 2019-05-30 DIAGNOSIS — R0683 Snoring: Secondary | ICD-10-CM

## 2019-05-30 DIAGNOSIS — G4720 Circadian rhythm sleep disorder, unspecified type: Secondary | ICD-10-CM

## 2019-05-30 DIAGNOSIS — R9431 Abnormal electrocardiogram [ECG] [EKG]: Secondary | ICD-10-CM

## 2019-05-30 DIAGNOSIS — G3184 Mild cognitive impairment, so stated: Secondary | ICD-10-CM

## 2019-05-30 DIAGNOSIS — G4733 Obstructive sleep apnea (adult) (pediatric): Secondary | ICD-10-CM

## 2019-05-30 NOTE — Telephone Encounter (Signed)
Medicare will not pay for a HST due to patient being noncompliant on CPAP and CPAP was taken back by DME company. Sleep lab needs an order for NPSG  and will get patient scheduled as soon as possible. MD can then order pt to come back for titration study or order Auto CPAP.

## 2019-05-30 NOTE — Addendum Note (Signed)
Addended by: Verlin Grills T on: 05/30/2019 08:43 AM   Modules accepted: Orders

## 2019-05-30 NOTE — Telephone Encounter (Signed)
I have placed the order in epic.

## 2019-06-03 ENCOUNTER — Ambulatory Visit: Payer: Medicare Other | Attending: Internal Medicine

## 2019-06-03 ENCOUNTER — Ambulatory Visit: Payer: Medicare Other

## 2019-06-03 DIAGNOSIS — Z23 Encounter for immunization: Secondary | ICD-10-CM | POA: Insufficient documentation

## 2019-06-03 NOTE — Progress Notes (Signed)
   Covid-19 Vaccination Clinic  Name:  Pamela Mason    MRN: [id number] DOB: Jan 04, 1946  06/03/2019  Pamela Mason was observed post Covid-19 immunization for 15 minutes without incidence. She was provided with Vaccine Information Sheet and instruction to access the V-Safe system.   Pamela Mason was instructed to call 911 with any severe reactions post vaccine: Marland Kitchen Difficulty breathing  . Swelling of your face and throat  . A fast heartbeat  . A bad rash all over your body  . Dizziness and weakness    Immunizations Administered    Name Date Dose VIS Date Route   Pfizer COVID-19 Vaccine 06/03/2019 10:30 AM 0.3 mL 04/20/2019 Intramuscular   Manufacturer: [place]   Lot: [id number]   [place]: [id number]

## 2019-06-07 ENCOUNTER — Ambulatory Visit: Payer: Self-pay | Admitting: Family Medicine

## 2019-06-08 DIAGNOSIS — R7301 Impaired fasting glucose: Secondary | ICD-10-CM | POA: Diagnosis not present

## 2019-06-08 DIAGNOSIS — I10 Essential (primary) hypertension: Secondary | ICD-10-CM | POA: Diagnosis not present

## 2019-06-08 DIAGNOSIS — R413 Other amnesia: Secondary | ICD-10-CM | POA: Diagnosis not present

## 2019-06-08 DIAGNOSIS — M8580 Other specified disorders of bone density and structure, unspecified site: Secondary | ICD-10-CM | POA: Diagnosis not present

## 2019-06-08 DIAGNOSIS — E538 Deficiency of other specified B group vitamins: Secondary | ICD-10-CM | POA: Diagnosis not present

## 2019-06-08 DIAGNOSIS — F329 Major depressive disorder, single episode, unspecified: Secondary | ICD-10-CM | POA: Diagnosis not present

## 2019-06-13 DIAGNOSIS — Z85820 Personal history of malignant melanoma of skin: Secondary | ICD-10-CM | POA: Diagnosis not present

## 2019-06-13 DIAGNOSIS — D1801 Hemangioma of skin and subcutaneous tissue: Secondary | ICD-10-CM | POA: Diagnosis not present

## 2019-06-13 DIAGNOSIS — L821 Other seborrheic keratosis: Secondary | ICD-10-CM | POA: Diagnosis not present

## 2019-06-13 DIAGNOSIS — D225 Melanocytic nevi of trunk: Secondary | ICD-10-CM | POA: Diagnosis not present

## 2019-06-14 DIAGNOSIS — Z96642 Presence of left artificial hip joint: Secondary | ICD-10-CM | POA: Diagnosis not present

## 2019-06-14 DIAGNOSIS — M25561 Pain in right knee: Secondary | ICD-10-CM | POA: Diagnosis not present

## 2019-06-14 DIAGNOSIS — M25562 Pain in left knee: Secondary | ICD-10-CM | POA: Diagnosis not present

## 2019-06-14 DIAGNOSIS — M1611 Unilateral primary osteoarthritis, right hip: Secondary | ICD-10-CM | POA: Diagnosis not present

## 2019-06-17 ENCOUNTER — Ambulatory Visit (INDEPENDENT_AMBULATORY_CARE_PROVIDER_SITE_OTHER): Payer: Medicare Other | Admitting: Neurology

## 2019-06-17 DIAGNOSIS — R0683 Snoring: Secondary | ICD-10-CM

## 2019-06-17 DIAGNOSIS — G4733 Obstructive sleep apnea (adult) (pediatric): Secondary | ICD-10-CM | POA: Diagnosis not present

## 2019-06-17 DIAGNOSIS — G3184 Mild cognitive impairment, so stated: Secondary | ICD-10-CM

## 2019-06-17 DIAGNOSIS — G4720 Circadian rhythm sleep disorder, unspecified type: Secondary | ICD-10-CM

## 2019-06-17 DIAGNOSIS — G4761 Periodic limb movement disorder: Secondary | ICD-10-CM

## 2019-06-17 DIAGNOSIS — R9431 Abnormal electrocardiogram [ECG] [EKG]: Secondary | ICD-10-CM

## 2019-06-25 ENCOUNTER — Ambulatory Visit: Payer: Medicare Other | Attending: Internal Medicine

## 2019-06-25 ENCOUNTER — Ambulatory Visit: Payer: Medicare Other

## 2019-06-25 DIAGNOSIS — Z23 Encounter for immunization: Secondary | ICD-10-CM | POA: Insufficient documentation

## 2019-06-25 NOTE — Progress Notes (Signed)
   Covid-19 Vaccination Clinic  Name:  JOLETTE BERKLAND    MRN: [id number] DOB: 10-15-1945  06/25/2019  Ms. Ganson was observed post Covid-19 immunization for 15 minutes without incidence. She was provided with Vaccine Information Sheet and instruction to access the V-Safe system.   Ms. Omoto was instructed to call 911 with any severe reactions post vaccine: Marland Kitchen Difficulty breathing  . Swelling of your face and throat  . A fast heartbeat  . A bad rash all over your body  . Dizziness and weakness    Immunizations Administered    Name Date Dose VIS Date Route   Pfizer COVID-19 Vaccine 06/25/2019  8:32 AM 0.3 mL 04/20/2019 Intramuscular   Manufacturer: [place]   Lot: [id number]   [place]: [id number]

## 2019-06-26 ENCOUNTER — Ambulatory Visit: Payer: Medicare Other

## 2019-06-27 ENCOUNTER — Telehealth: Payer: Self-pay

## 2019-06-27 NOTE — Telephone Encounter (Signed)
-----   Message from Star Age, MD sent at 06/27/2019  9:00 AM EST ----- Patient referred for re-eval of her OSA, last seen by MM on 05/23/19, she had repeat PSG on 06/17/19. Please call and notify the patient that the recent sleep study did not show any significant obstructive sleep apnea. She did not sleep very well. She had extra beats, called PVCs on her EKG. Please ask her to FU with her PCP for a full EKG and/or referral to a cardiologist, if PCP would agree. She can FU with MM for her memory loss in about 4-5 months, pls assist in scheduling appt.  Thanks,  Star Age, MD, PhD [facility])

## 2019-06-27 NOTE — Telephone Encounter (Signed)
Left vm asking for pt to call back so we could review sleep study results.

## 2019-06-27 NOTE — Procedures (Signed)
PATIENT'S NAME:  Pamela Mason, Pamela Mason DOB:      Oct 25, 1945      MR#:    [id number]     DATE OF RECORDING: 06/17/2019 REFERRING M.D.:  Crist Infante MD Study Performed:   Baseline Polysomnogram HISTORY: 74 year old right-handed woman with an underlying medical history of hypertension, osteopenia, hyperlipidemia, depression, anxiety, and memory loss, who presents for re-evaluation of her OSA. She no longer has a PAP machined, due to non-compliance. The patient's weight 119 pounds with a height of 63 (inches), resulting in a BMI of 21.1 kg/m2. The patient's neck circumference measured 12.5 inches.  CURRENT MEDICATIONS: Vitamin B12, Razadyne, Prinivil, Flinstones plus iron, Zoloft, Vitamin D   PROCEDURE:  This is a multichannel digital polysomnogram utilizing the Somnostar 11.2 system.  Electrodes and sensors were applied and monitored per AASM Specifications.   EEG, EOG, Chin and Limb EMG, were sampled at 200 Hz.  ECG, Snore and Nasal Pressure, Thermal Airflow, Respiratory Effort, CPAP Flow and Pressure, Oximetry was sampled at 50 Hz. Digital video and audio were recorded.      BASELINE STUDY  Lights Out was at 20:58 and Lights On at 05:02.  Total recording time (TRT) was 485 minutes, with a total sleep time (TST) of 240.5 minutes.   The patient's sleep latency was 107.5 minutes, which is delayed. REM latency was 248.5 minutes, which is markedly delayed. The sleep efficiency was 49.6%, which is reduced.     SLEEP ARCHITECTURE: WASO (Wake after sleep onset) was 146 minutes with several longer periods of wakefulness and moderate sleep fragmentation noted. There were 23.5 minutes in Stage N1, 57 minutes Stage N2, 102 minutes Stage N3 and 58 minutes in Stage REM.  The percentage of Stage N1 was 9.8%, which is increased, Stage N2 was 23.7%, Stage N3 was 42.4%, which is increased, and Stage R (REM sleep) was 24.1%, which is normal. The arousals were noted as: 35 were spontaneous, 5 were associated with PLMs, 0 were  associated with respiratory events.  RESPIRATORY ANALYSIS:  There were a total of 0 respiratory events:  0 obstructive apneas, 0 central apneas and 0 mixed apneas with a total of 0 apneas and an apnea index (AI) of 0 /hour. There were 0 hypopneas with a hypopnea index of 0 /hour. The patient also had 0 respiratory event related arousals (RERAs).      The total APNEA/HYPOPNEA INDEX (AHI) was 0/hour and the total RESPIRATORY DISTURBANCE INDEX was  0 /hour.  0 events occurred in REM sleep and 0 events in NREM. The REM AHI was  0 /hour, versus a non-REM AHI of 0. The patient spent 89 minutes of total sleep time in the supine position and 152 minutes in non-supine.. The supine AHI was 0.0 versus a non-supine AHI of 0.0.  OXYGEN SATURATION & C02:  The Wake baseline 02 saturation was 97%, with the lowest being 89%. Time spent below 89% saturation equaled 0 minutes.  PERIODIC LIMB MOVEMENTS: The patient had a total of 54 Periodic Limb Movements.  The Periodic Limb Movement (PLM) index was 13.5 and the PLM Arousal index was 1.2/hour.  Audio and video analysis did not show any abnormal or unusual movements, behaviors, phonations or vocalizations. The patient took 2 bathroom breaks. Mild to moderate snoring was noted. The EKG was in keeping with normal sinus rhythm (NSR) with occasional PVCs noted.  Post-study, the patient indicated that sleep was the same as usual.   IMPRESSION: 1. Primary Snoring 2. Dysfunctions associated with sleep stages or  arousal from sleep 3. Non-specific abnormal EKG   RECOMMENDATIONS: 1. This study does not demonstrate any significant obstructive or central sleep disordered breathing. The study was limited due to reduced sleep efficiency. Mild to moderate snoring was noted. 2. This study shows sleep fragmentation and abnormal sleep stage percentages; these are nonspecific findings and per se do not signify an intrinsic sleep disorder or a cause for the patient's sleep-related  symptoms. Causes include (but are not limited to) the first night effect of the sleep study, circadian rhythm disturbances, medication effect or an underlying mood disorder or medical problem.  3. The study showed occasional PVCs on single lead EKG; clinical correlation is recommended and consultation with cardiology may be feasible. A proper, 12 lead EKG is recommended. 4. This study shows sleep fragmentation and abnormal sleep stage percentages; these are nonspecific findings and per se do not signify an intrinsic sleep disorder or a cause for the patient's sleep-related symptoms. Causes include (but are not limited to) the first night effect of the sleep study, circadian rhythm disturbances, medication effect or an underlying mood disorder or medical problem.  5. The patient should be cautioned not to drive, work at heights, or operate dangerous or heavy equipment when tired or sleepy. Review and reiteration of good sleep hygiene measures should be pursued with any patient.  6. The patient will be seen in follow-up at [facility] for discussion of the test results, symptom and treatment compliance review, further management strategies, etc. The referring provider will be notified of the test results.   I certify that I have reviewed the entire raw data recording prior to the issuance of this report in accordance with the Standards of Accreditation of the American Academy of Sleep Medicine (AASM)   Star Age, MD, PhD Diplomat, American Board of Neurology and Sleep Medicine (Neurology and Sleep Medicine)

## 2019-06-27 NOTE — Progress Notes (Signed)
Patient referred for re-eval of her OSA, last seen by MM on 05/23/19, she had repeat PSG on 06/17/19. Please call and notify the patient that the recent sleep study did not show any significant obstructive sleep apnea. She did not sleep very well. She had extra beats, called PVCs on her EKG. Please ask her to FU with her PCP for a full EKG and/or referral to a cardiologist, if PCP would agree. She can FU with MM for her memory loss in about 4-5 months, pls assist in scheduling appt.  Thanks,  Star Age, MD, PhD [facility])

## 2019-06-28 DIAGNOSIS — F331 Major depressive disorder, recurrent, moderate: Secondary | ICD-10-CM | POA: Diagnosis not present

## 2019-07-02 NOTE — Telephone Encounter (Signed)
Pt returned call. Pt states she will be at that number for the rest of the day.

## 2019-07-02 NOTE — Telephone Encounter (Signed)
LVM asking pt to call me back.

## 2019-07-02 NOTE — Telephone Encounter (Signed)
Left second vm for pt to call back.

## 2019-07-03 NOTE — Telephone Encounter (Signed)
Pt called back and advised of sleep study results. Pt verbalized understanding and she will check in with her PCP.  Hilda Blades can you send a copy of sleep study to Dr. Silvestre Mesi office?

## 2019-07-03 NOTE — Telephone Encounter (Signed)
Done

## 2019-08-01 ENCOUNTER — Other Ambulatory Visit: Payer: Self-pay

## 2019-08-01 ENCOUNTER — Ambulatory Visit
Admission: RE | Admit: 2019-08-01 | Discharge: 2019-08-01 | Disposition: A | Discharge status: Home / Self Care | Payer: Medicare Other | Source: Ambulatory Visit | Attending: Internal Medicine | Admitting: Internal Medicine

## 2019-08-01 DIAGNOSIS — Z1231 Encounter for screening mammogram for malignant neoplasm of breast: Secondary | ICD-10-CM | POA: Diagnosis not present

## 2019-08-22 DIAGNOSIS — F331 Major depressive disorder, recurrent, moderate: Secondary | ICD-10-CM | POA: Diagnosis not present

## 2019-09-12 DIAGNOSIS — M859 Disorder of bone density and structure, unspecified: Secondary | ICD-10-CM | POA: Diagnosis not present

## 2019-09-12 DIAGNOSIS — R7301 Impaired fasting glucose: Secondary | ICD-10-CM | POA: Diagnosis not present

## 2019-09-12 DIAGNOSIS — E7849 Other hyperlipidemia: Secondary | ICD-10-CM | POA: Diagnosis not present

## 2019-09-12 DIAGNOSIS — M8589 Other specified disorders of bone density and structure, multiple sites: Secondary | ICD-10-CM | POA: Diagnosis not present

## 2019-09-19 DIAGNOSIS — R7301 Impaired fasting glucose: Secondary | ICD-10-CM | POA: Diagnosis not present

## 2019-09-19 DIAGNOSIS — I447 Left bundle-branch block, unspecified: Secondary | ICD-10-CM | POA: Diagnosis not present

## 2019-09-19 DIAGNOSIS — M8580 Other specified disorders of bone density and structure, unspecified site: Secondary | ICD-10-CM | POA: Diagnosis not present

## 2019-09-19 DIAGNOSIS — Z85820 Personal history of malignant melanoma of skin: Secondary | ICD-10-CM | POA: Diagnosis not present

## 2019-09-19 DIAGNOSIS — E559 Vitamin D deficiency, unspecified: Secondary | ICD-10-CM | POA: Diagnosis not present

## 2019-09-19 DIAGNOSIS — R82998 Other abnormal findings in urine: Secondary | ICD-10-CM | POA: Diagnosis not present

## 2019-09-19 DIAGNOSIS — E785 Hyperlipidemia, unspecified: Secondary | ICD-10-CM | POA: Diagnosis not present

## 2019-09-19 DIAGNOSIS — E538 Deficiency of other specified B group vitamins: Secondary | ICD-10-CM | POA: Diagnosis not present

## 2019-09-19 DIAGNOSIS — H9192 Unspecified hearing loss, left ear: Secondary | ICD-10-CM | POA: Diagnosis not present

## 2019-09-19 DIAGNOSIS — I10 Essential (primary) hypertension: Secondary | ICD-10-CM | POA: Diagnosis not present

## 2019-09-19 DIAGNOSIS — Z1331 Encounter for screening for depression: Secondary | ICD-10-CM | POA: Diagnosis not present

## 2019-09-19 DIAGNOSIS — R413 Other amnesia: Secondary | ICD-10-CM | POA: Diagnosis not present

## 2019-09-19 DIAGNOSIS — Z0000 Encounter for general adult medical examination without abnormal findings: Secondary | ICD-10-CM | POA: Diagnosis not present

## 2019-09-19 DIAGNOSIS — F329 Major depressive disorder, single episode, unspecified: Secondary | ICD-10-CM | POA: Diagnosis not present

## 2019-12-24 DIAGNOSIS — F331 Major depressive disorder, recurrent, moderate: Secondary | ICD-10-CM | POA: Diagnosis not present

## 2020-01-04 DIAGNOSIS — H35372 Puckering of macula, left eye: Secondary | ICD-10-CM | POA: Diagnosis not present

## 2020-01-04 DIAGNOSIS — H40012 Open angle with borderline findings, low risk, left eye: Secondary | ICD-10-CM | POA: Diagnosis not present

## 2020-01-04 DIAGNOSIS — H52203 Unspecified astigmatism, bilateral: Secondary | ICD-10-CM | POA: Diagnosis not present

## 2020-01-04 DIAGNOSIS — H5203 Hypermetropia, bilateral: Secondary | ICD-10-CM | POA: Diagnosis not present

## 2020-01-04 DIAGNOSIS — H524 Presbyopia: Secondary | ICD-10-CM | POA: Diagnosis not present

## 2020-01-04 DIAGNOSIS — H268 Other specified cataract: Secondary | ICD-10-CM | POA: Diagnosis not present

## 2020-01-04 DIAGNOSIS — Z961 Presence of intraocular lens: Secondary | ICD-10-CM | POA: Diagnosis not present

## 2020-01-28 ENCOUNTER — Encounter: Payer: Medicare Other | Admitting: Obstetrics and Gynecology

## 2020-02-18 DIAGNOSIS — Z23 Encounter for immunization: Secondary | ICD-10-CM | POA: Diagnosis not present

## 2020-03-01 DIAGNOSIS — Z23 Encounter for immunization: Secondary | ICD-10-CM | POA: Diagnosis not present

## 2020-03-13 DIAGNOSIS — F331 Major depressive disorder, recurrent, moderate: Secondary | ICD-10-CM | POA: Diagnosis not present

## 2020-03-18 DIAGNOSIS — M8580 Other specified disorders of bone density and structure, unspecified site: Secondary | ICD-10-CM | POA: Diagnosis not present

## 2020-03-18 DIAGNOSIS — R413 Other amnesia: Secondary | ICD-10-CM | POA: Diagnosis not present

## 2020-03-18 DIAGNOSIS — I10 Essential (primary) hypertension: Secondary | ICD-10-CM | POA: Diagnosis not present

## 2020-03-18 DIAGNOSIS — R7301 Impaired fasting glucose: Secondary | ICD-10-CM | POA: Diagnosis not present

## 2020-03-18 DIAGNOSIS — E785 Hyperlipidemia, unspecified: Secondary | ICD-10-CM | POA: Diagnosis not present

## 2020-03-18 DIAGNOSIS — F329 Major depressive disorder, single episode, unspecified: Secondary | ICD-10-CM | POA: Diagnosis not present

## 2020-05-24 IMAGING — RF OPERATIVE LEFT HIP WITH PELVIS
1 series · 2 of 2 positions shown · non-contrast
Comparison: None.

CLINICAL DATA: Status post left hip replacement.

EXAM:
OPERATIVE left HIP (WITH PELVIS IF PERFORMED) 2 VIEWS
TECHNIQUE: Fluoroscopic spot image(s) were submitted for interpretation
post-operatively.
Radiation exposure index: 1.54 mGy.

[Series 1: run · 2 of 2 slices shown]
[im 1/2]
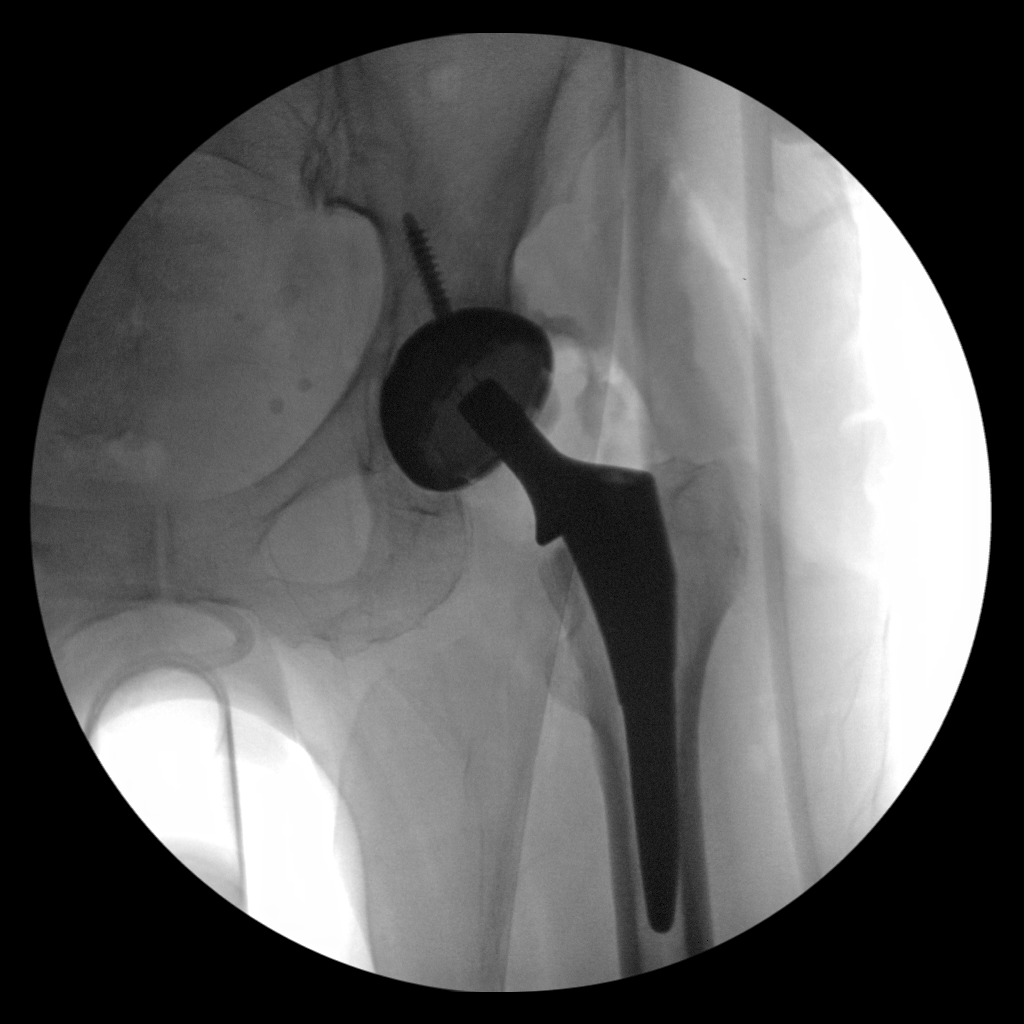
[im 2/2]
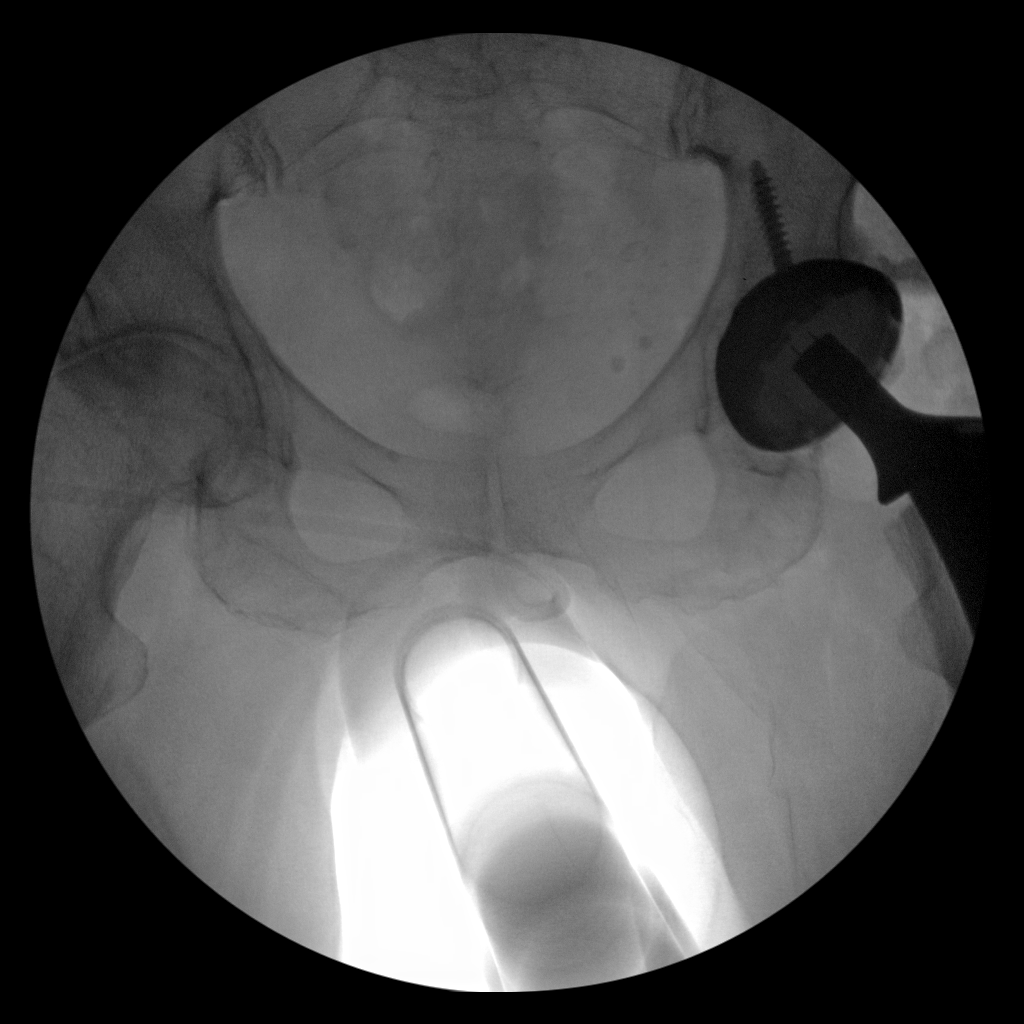

[2 of 2 positions shown; findings below may reference images not displayed]

FINDINGS: Two intraoperative fluoroscopic images of the left hip demonstrate
the left acetabular and femoral components to be well position.
IMPRESSION: Fluoroscopic guidance provided during left total hip arthroplasty.

## 2020-06-23 DIAGNOSIS — R82998 Other abnormal findings in urine: Secondary | ICD-10-CM | POA: Diagnosis not present

## 2020-06-23 DIAGNOSIS — E785 Hyperlipidemia, unspecified: Secondary | ICD-10-CM | POA: Diagnosis not present

## 2020-06-23 DIAGNOSIS — Z1212 Encounter for screening for malignant neoplasm of rectum: Secondary | ICD-10-CM | POA: Diagnosis not present

## 2020-07-02 DIAGNOSIS — L821 Other seborrheic keratosis: Secondary | ICD-10-CM | POA: Diagnosis not present

## 2020-07-02 DIAGNOSIS — Z85820 Personal history of malignant melanoma of skin: Secondary | ICD-10-CM | POA: Diagnosis not present

## 2020-07-02 DIAGNOSIS — D225 Melanocytic nevi of trunk: Secondary | ICD-10-CM | POA: Diagnosis not present

## 2020-07-02 DIAGNOSIS — L738 Other specified follicular disorders: Secondary | ICD-10-CM | POA: Diagnosis not present

## 2020-07-02 DIAGNOSIS — D1801 Hemangioma of skin and subcutaneous tissue: Secondary | ICD-10-CM | POA: Diagnosis not present

## 2020-09-17 DIAGNOSIS — E559 Vitamin D deficiency, unspecified: Secondary | ICD-10-CM | POA: Diagnosis not present

## 2020-09-17 DIAGNOSIS — R7301 Impaired fasting glucose: Secondary | ICD-10-CM | POA: Diagnosis not present

## 2020-09-17 DIAGNOSIS — E785 Hyperlipidemia, unspecified: Secondary | ICD-10-CM | POA: Diagnosis not present

## 2020-09-24 DIAGNOSIS — N390 Urinary tract infection, site not specified: Secondary | ICD-10-CM | POA: Diagnosis not present

## 2020-09-24 DIAGNOSIS — E538 Deficiency of other specified B group vitamins: Secondary | ICD-10-CM | POA: Diagnosis not present

## 2020-09-24 DIAGNOSIS — E559 Vitamin D deficiency, unspecified: Secondary | ICD-10-CM | POA: Diagnosis not present

## 2020-09-24 DIAGNOSIS — Z1389 Encounter for screening for other disorder: Secondary | ICD-10-CM | POA: Diagnosis not present

## 2020-09-24 DIAGNOSIS — F329 Major depressive disorder, single episode, unspecified: Secondary | ICD-10-CM | POA: Diagnosis not present

## 2020-09-24 DIAGNOSIS — R82998 Other abnormal findings in urine: Secondary | ICD-10-CM | POA: Diagnosis not present

## 2020-09-24 DIAGNOSIS — R413 Other amnesia: Secondary | ICD-10-CM | POA: Diagnosis not present

## 2020-09-24 DIAGNOSIS — E785 Hyperlipidemia, unspecified: Secondary | ICD-10-CM | POA: Diagnosis not present

## 2020-09-24 DIAGNOSIS — Z0000 Encounter for general adult medical examination without abnormal findings: Secondary | ICD-10-CM | POA: Diagnosis not present

## 2020-09-24 DIAGNOSIS — R7301 Impaired fasting glucose: Secondary | ICD-10-CM | POA: Diagnosis not present

## 2020-09-24 DIAGNOSIS — I10 Essential (primary) hypertension: Secondary | ICD-10-CM | POA: Diagnosis not present

## 2020-09-24 DIAGNOSIS — M8580 Other specified disorders of bone density and structure, unspecified site: Secondary | ICD-10-CM | POA: Diagnosis not present

## 2020-10-23 ENCOUNTER — Encounter (HOSPITAL_COMMUNITY): Payer: Self-pay

## 2020-10-23 ENCOUNTER — Other Ambulatory Visit: Payer: Self-pay

## 2020-10-23 ENCOUNTER — Observation Stay (HOSPITAL_COMMUNITY): Payer: Medicare Other

## 2020-10-23 ENCOUNTER — Inpatient Hospital Stay (HOSPITAL_COMMUNITY)
Admission: EM | Admit: 2020-10-23 | Discharge: 2020-10-26 | DRG: 041 | Disposition: A | Discharge status: Rehabilitation Facility | Payer: Medicare Other | Attending: Internal Medicine | Admitting: Internal Medicine

## 2020-10-23 ENCOUNTER — Emergency Department (HOSPITAL_COMMUNITY): Payer: Medicare Other

## 2020-10-23 DIAGNOSIS — I10 Essential (primary) hypertension: Secondary | ICD-10-CM | POA: Diagnosis not present

## 2020-10-23 DIAGNOSIS — Z8041 Family history of malignant neoplasm of ovary: Secondary | ICD-10-CM

## 2020-10-23 DIAGNOSIS — R4182 Altered mental status, unspecified: Secondary | ICD-10-CM | POA: Diagnosis not present

## 2020-10-23 DIAGNOSIS — F32A Depression, unspecified: Secondary | ICD-10-CM | POA: Diagnosis not present

## 2020-10-23 DIAGNOSIS — G8194 Hemiplegia, unspecified affecting left nondominant side: Secondary | ICD-10-CM | POA: Diagnosis not present

## 2020-10-23 DIAGNOSIS — G319 Degenerative disease of nervous system, unspecified: Secondary | ICD-10-CM | POA: Diagnosis not present

## 2020-10-23 DIAGNOSIS — F419 Anxiety disorder, unspecified: Secondary | ICD-10-CM | POA: Diagnosis present

## 2020-10-23 DIAGNOSIS — E785 Hyperlipidemia, unspecified: Secondary | ICD-10-CM | POA: Diagnosis present

## 2020-10-23 DIAGNOSIS — Z8673 Personal history of transient ischemic attack (TIA), and cerebral infarction without residual deficits: Secondary | ICD-10-CM

## 2020-10-23 DIAGNOSIS — Z96642 Presence of left artificial hip joint: Secondary | ICD-10-CM | POA: Diagnosis present

## 2020-10-23 DIAGNOSIS — G459 Transient cerebral ischemic attack, unspecified: Secondary | ICD-10-CM | POA: Diagnosis not present

## 2020-10-23 DIAGNOSIS — Z79899 Other long term (current) drug therapy: Secondary | ICD-10-CM

## 2020-10-23 DIAGNOSIS — I639 Cerebral infarction, unspecified: Principal | ICD-10-CM | POA: Diagnosis present

## 2020-10-23 DIAGNOSIS — I712 Thoracic aortic aneurysm, without rupture: Secondary | ICD-10-CM | POA: Diagnosis present

## 2020-10-23 DIAGNOSIS — G4733 Obstructive sleep apnea (adult) (pediatric): Secondary | ICD-10-CM | POA: Diagnosis present

## 2020-10-23 DIAGNOSIS — R29810 Facial weakness: Secondary | ICD-10-CM | POA: Diagnosis not present

## 2020-10-23 DIAGNOSIS — R42 Dizziness and giddiness: Secondary | ICD-10-CM | POA: Diagnosis not present

## 2020-10-23 DIAGNOSIS — I447 Left bundle-branch block, unspecified: Secondary | ICD-10-CM | POA: Diagnosis not present

## 2020-10-23 DIAGNOSIS — R531 Weakness: Secondary | ICD-10-CM

## 2020-10-23 DIAGNOSIS — F0391 Unspecified dementia with behavioral disturbance: Secondary | ICD-10-CM | POA: Diagnosis not present

## 2020-10-23 DIAGNOSIS — R29898 Other symptoms and signs involving the musculoskeletal system: Secondary | ICD-10-CM | POA: Diagnosis not present

## 2020-10-23 DIAGNOSIS — I499 Cardiac arrhythmia, unspecified: Secondary | ICD-10-CM | POA: Diagnosis not present

## 2020-10-23 DIAGNOSIS — R270 Ataxia, unspecified: Secondary | ICD-10-CM | POA: Diagnosis present

## 2020-10-23 DIAGNOSIS — Z87891 Personal history of nicotine dependence: Secondary | ICD-10-CM

## 2020-10-23 DIAGNOSIS — R29704 NIHSS score 4: Secondary | ICD-10-CM | POA: Diagnosis present

## 2020-10-23 DIAGNOSIS — R29818 Other symptoms and signs involving the nervous system: Secondary | ICD-10-CM | POA: Diagnosis not present

## 2020-10-23 DIAGNOSIS — Z20822 Contact with and (suspected) exposure to covid-19: Secondary | ICD-10-CM | POA: Diagnosis not present

## 2020-10-23 DIAGNOSIS — Z8249 Family history of ischemic heart disease and other diseases of the circulatory system: Secondary | ICD-10-CM

## 2020-10-23 DIAGNOSIS — Z91040 Latex allergy status: Secondary | ICD-10-CM

## 2020-10-23 DIAGNOSIS — I671 Cerebral aneurysm, nonruptured: Secondary | ICD-10-CM | POA: Diagnosis not present

## 2020-10-23 LAB — COMPREHENSIVE METABOLIC PANEL
ALT: 21 U/L (ref 0–44)
AST: 28 U/L (ref 15–41)
Albumin: 3.4 g/dL — ABNORMAL LOW (ref 3.5–5.0)
Alkaline Phosphatase: 69 U/L (ref 38–126)
Anion gap: 9 (ref 5–15)
BUN: 18 mg/dL (ref 8–23)
CO2: 23 mmol/L (ref 22–32)
Calcium: 8.9 mg/dL (ref 8.9–10.3)
Chloride: 107 mmol/L (ref 98–111)
Creatinine, Ser: 0.89 mg/dL (ref 0.44–1.00)
GFR, Estimated: >60 mL/min (ref >60)
Glucose, Bld: 100 mg/dL — ABNORMAL HIGH (ref 70–99)
Potassium: 4.6 mmol/L (ref 3.5–5.1)
Sodium: 139 mmol/L (ref 135–145)
Total Bilirubin: 1 mg/dL (ref 0.3–1.2)
Total Protein: 6 g/dL — ABNORMAL LOW (ref 6.5–8.1)

## 2020-10-23 LAB — RAPID URINE DRUG SCREEN, HOSP PERFORMED
Amphetamines: NOT DETECTED
Barbiturates: NOT DETECTED
Benzodiazepines: NOT DETECTED
Cocaine: NOT DETECTED
Opiates: NOT DETECTED
Tetrahydrocannabinol: NOT DETECTED

## 2020-10-23 LAB — CBG MONITORING, ED: Glucose-Capillary: 84 mg/dL (ref 70–99)

## 2020-10-23 LAB — CBC
HCT: 39.1 % (ref 36.0–46.0)
Hemoglobin: 12.6 g/dL (ref 12.0–15.0)
MCH: 32.3 pg (ref 26.0–34.0)
MCHC: 32.2 g/dL (ref 30.0–36.0)
MCV: 100.3 fL — ABNORMAL HIGH (ref 80.0–100.0)
Platelets: 224 10*3/uL (ref 150–400)
RBC: 3.9 MIL/uL (ref 3.87–5.11)
RDW: 11.9 % (ref 11.5–15.5)
WBC: 6.4 10*3/uL (ref 4.0–10.5)
nRBC: 0 % (ref 0.0–0.2)

## 2020-10-23 LAB — URINALYSIS, ROUTINE W REFLEX MICROSCOPIC
Bacteria, UA: NONE SEEN
Bilirubin Urine: NEGATIVE
Glucose, UA: NEGATIVE mg/dL
Hgb urine dipstick: NEGATIVE
Ketones, ur: 5 mg/dL — AB
Nitrite: NEGATIVE
Protein, ur: NEGATIVE mg/dL
Specific Gravity, Urine: 1.005 (ref 1.005–1.030)
pH: 5 (ref 5.0–8.0)

## 2020-10-23 LAB — DIFFERENTIAL
Abs Immature Granulocytes: 0.03 10*3/uL (ref 0.00–0.07)
Basophils Absolute: 0.1 10*3/uL (ref 0.0–0.1)
Basophils Relative: 1 %
Eosinophils Absolute: 0.1 10*3/uL (ref 0.0–0.5)
Eosinophils Relative: 2 %
Immature Granulocytes: 1 %
Lymphocytes Relative: 15 %
Lymphs Abs: 1 10*3/uL (ref 0.7–4.0)
Monocytes Absolute: 0.4 10*3/uL (ref 0.1–1.0)
Monocytes Relative: 7 %
Neutro Abs: 4.8 10*3/uL (ref 1.7–7.7)
Neutrophils Relative %: 74 %

## 2020-10-23 LAB — PROTIME-INR
INR: 0.9 (ref 0.8–1.2)
Prothrombin Time: 12.5 seconds (ref 11.4–15.2)

## 2020-10-23 LAB — APTT: aPTT: 24 seconds (ref 24–36)

## 2020-10-23 LAB — RESP PANEL BY RT-PCR (FLU A&B, COVID) ARPGX2
Influenza A by PCR: NEGATIVE
Influenza B by PCR: NEGATIVE
SARS Coronavirus 2 by RT PCR: NEGATIVE

## 2020-10-23 MED ORDER — IOHEXOL 350 MG/ML SOLN
75.0000 mL | Freq: Once | INTRAVENOUS | Status: AC | PRN
  Administered 2020-10-23: 75 mL via INTRAVENOUS

## 2020-10-23 MED ORDER — SODIUM CHLORIDE 0.9 % IV BOLUS
500.0000 mL | Freq: Once | INTRAVENOUS | Status: AC
  Administered 2020-10-23: 500 mL via INTRAVENOUS

## 2020-10-23 MED ORDER — ASPIRIN EC 325 MG PO TBEC: 325.0000 mg | DELAYED_RELEASE_TABLET | Freq: Every day | ORAL | 0 refills | Status: DC

## 2020-10-23 MED ORDER — SODIUM CHLORIDE 0.9 % IV SOLN
100.0000 mL/h | INTRAVENOUS | Status: DC
  Administered 2020-10-23 (×2): 100 mL/h via INTRAVENOUS

## 2020-10-23 MED ORDER — GALANTAMINE HYDROBROMIDE ER 8 MG PO CP24
8.0000 mg | ORAL_CAPSULE | Freq: Every day | ORAL | Status: DC
  Administered 2020-10-24 – 2020-10-26 (×3): 8 mg via ORAL
  Filled 2020-10-23 (×3): qty 1

## 2020-10-23 MED ORDER — ACETAMINOPHEN 160 MG/5ML PO SOLN: 650.0000 mg | ORAL | Status: DC | PRN

## 2020-10-23 MED ORDER — CLEVIDIPINE BUTYRATE 0.5 MG/ML IV EMUL
INTRAVENOUS | Status: AC
  Filled 2020-10-23: qty 50

## 2020-10-23 MED ORDER — ASPIRIN EC 81 MG PO TBEC
81.0000 mg | DELAYED_RELEASE_TABLET | Freq: Every day | ORAL | Status: DC
  Administered 2020-10-24 – 2020-10-26 (×3): 81 mg via ORAL
  Filled 2020-10-23 (×3): qty 1

## 2020-10-23 MED ORDER — SERTRALINE HCL 50 MG PO TABS
150.0000 mg | ORAL_TABLET | Freq: Every day | ORAL | Status: DC
  Administered 2020-10-24 – 2020-10-26 (×3): 150 mg via ORAL
  Filled 2020-10-23 (×3): qty 1

## 2020-10-23 MED ORDER — ATORVASTATIN CALCIUM 40 MG PO TABS
40.0000 mg | ORAL_TABLET | Freq: Every day | ORAL | Status: DC
  Administered 2020-10-24: 40 mg via ORAL
  Filled 2020-10-23: qty 1

## 2020-10-23 MED ORDER — STROKE: EARLY STAGES OF RECOVERY BOOK
Freq: Once | Status: AC
  Filled 2020-10-23: qty 1

## 2020-10-23 MED ORDER — ACETAMINOPHEN 325 MG PO TABS: 650.0000 mg | ORAL_TABLET | ORAL | Status: DC | PRN

## 2020-10-23 MED ORDER — SENNOSIDES-DOCUSATE SODIUM 8.6-50 MG PO TABS: 1.0000 | ORAL_TABLET | Freq: Every evening | ORAL | Status: DC | PRN

## 2020-10-23 MED ORDER — CLOPIDOGREL BISULFATE 75 MG PO TABS
75.0000 mg | ORAL_TABLET | Freq: Every day | ORAL | Status: DC
  Administered 2020-10-24 – 2020-10-26 (×3): 75 mg via ORAL
  Filled 2020-10-23 (×3): qty 1

## 2020-10-23 MED ORDER — CLOPIDOGREL BISULFATE 75 MG PO TABS: 150.0000 mg | ORAL_TABLET | ORAL | Status: AC

## 2020-10-23 MED ORDER — ENOXAPARIN SODIUM 40 MG/0.4ML IJ SOSY
40.0000 mg | PREFILLED_SYRINGE | INTRAMUSCULAR | Status: DC
  Administered 2020-10-24 – 2020-10-25 (×2): 40 mg via SUBCUTANEOUS
  Filled 2020-10-23 (×2): qty 0.4

## 2020-10-23 MED ORDER — ASPIRIN 81 MG PO CHEW
324.0000 mg | CHEWABLE_TABLET | Freq: Once | ORAL | Status: AC
  Administered 2020-10-23: 324 mg via ORAL
  Filled 2020-10-23: qty 4

## 2020-10-23 MED ORDER — ACETAMINOPHEN 650 MG RE SUPP: 650.0000 mg | RECTAL | Status: DC | PRN

## 2020-10-23 MED ORDER — ATORVASTATIN CALCIUM 10 MG PO TABS: 10.0000 mg | ORAL_TABLET | Freq: Once | ORAL | Status: DC

## 2020-10-23 MED ORDER — ASPIRIN 325 MG PO TABS: 325.0000 mg | ORAL_TABLET | ORAL | Status: AC

## 2020-10-23 NOTE — ED Notes (Signed)
[facility] called to activate code stroke per Dr. Johnney Killian LSN [phone] with L sided weakness

## 2020-10-23 NOTE — ED Notes (Signed)
Patient transported to MRI 

## 2020-10-23 NOTE — Code Documentation (Signed)
Stroke Response Nurse Documentation Code Stroke Documentation LATOSHIA MONRROY  is a 75 y.o.  y.o. female  arriving to [facility] ED via [facility] on 10/23/2020 around 1100 with complaints of L weakness and dizziness that resolved en route to the hospital.  Per EDP notes, suspicion of TIA with resolution and plan was to discharge patient home. Patient with PMH of Dementia, HLD, HTN. Code stroke was activated by ED at 1658 for new onset L weakness. Pt was LKW at 1616 when she ambulated with no difficulty. Patient taking No antithrombotic PTA, received 324 mg ASA in ED. Stroke team met pt en route to CT, CBG 86. Patient cleared for CT by Dr. Johnney Killian. NIHSS 3, see documentation for details and code stroke times. Patient with left limb ataxia and left neglect on exam. The following imaging was completed:  CT Head, CTA Head and neck. Patient is not a candidate for tPA due to being too good to treat at this time per Dr. Cheral Marker. Patient will be within tPA window until 2046, [id number]/[id number] while inside window. Bedside handoff with ED RN Alana.    MeLenore MannerStroke Response RN [phone]

## 2020-10-23 NOTE — ED Notes (Signed)
Pt ambulated well. I told pt to I would recommend a walker for extra support.

## 2020-10-23 NOTE — Consult Note (Signed)
Neurology consult   CC: code stroke  History is obtained from: RN, daughter at bedside.  HPI: Ms. Pamela Mason is a 75 yo female with a PMHx of HTN, HLD, OSA, Osteopenia, depression, anxiety, and mild cognitive impairment who came to the ED today after having TIA symptoms that began at 1000 hours and resolved in 5 minutes. CTH was performed on arrival without acute findings. Her symptoms continued to be dissipated, and ED MD was preparing for discharge home. Earlier NTs had walked her without a problem. However, in getting patient into the wheelchair, she leaned to the left and was basically flaccid in the left leg, and left arm was incoordinated. LKW was 1616 when she had recurrence of symptoms seen by NTs/RNs. Discharge held and code stroke was called. ASA 325mg  was given in the ED.  In CT scan, patient was ataxic with left arm and leg. No drift. No aphasia, dysarthria, or neglect. Slight weakness to right arm and leg.   CTH was negative for acute, but showed chronic right MCA infarct. CTA head and neck was negative for LVO. tPA not required due to symptoms returning to normal. IR not needed due to no LVO.   Patient was moved back to ED and will be admitted for TIA/stroke workup.   Old stroke on [place] unknown to patient or daughter.   LKW:  1616 hours tpa given?: No, not a candidate due to relatively mild symptoms after rapid improvement of initial deficits; risks of tPA outweigh potential benefits.  IR Thrombectomy?: No, no LVO.  MRS 1  NIHSS:  1a Level of Consciousness: 0 1b LOC Questions: 0 1c LOC Commands: 0 2 Best Gaze: 0 3 Visual: 0 4 Facial Palsy: 0 5a Motor Arm - left: 0 5b Motor Arm - Right: 0 6a Motor Leg - Left: 0 6b Motor Leg - Right: 0 7 Limb Ataxia: 2 8 Sensory: 0 9 Best Language: 0 10 Dysarthria: 0 11 Extinction and Inattention: 2 TOTAL:  4  ROS: A robust ROS was unable to be performed due to emergent nature of event.   Past Medical History:  Diagnosis Date    Anxiety    Arthritis    Complication of anesthesia    slow to wake up   Dementia ([place]) 2018   memory loss   Depression    Hyperlipidemia    Hypertension    Osteopenia    Sleep apnea     Family History  Problem Relation Age of Onset   Hypertension Mother    Ovarian cancer Mother    Heart disease Father    Colon cancer Neg Hx     Social History:  reports that she has quit smoking. Her smoking use included cigarettes. She has a 1.00 pack-year smoking history. She has never used smokeless tobacco. She reports current alcohol use of about 7.0 standard drinks of alcohol per week. She reports that she does not use drugs.   Prior to Admission medications   Medication Sig Start Date End Date Taking? Authorizing Provider  aspirin EC 325 MG tablet Take 1 tablet (325 mg total) by mouth daily. 10/23/20  Yes Carmin Muskrat, MD  docusate sodium (COLACE) 100 MG capsule Take 1 capsule (100 mg total) by mouth 2 (two) times daily. 01/03/19   Danae Orleans, PA-C  galantamine (RAZADYNE ER) 8 MG 24 hr capsule Take 8 mg by mouth daily with lunch.     [provider]  lisinopril (PRINIVIL,ZESTRIL) 10 MG tablet Take 10 mg by mouth  every evening.  01/02/15   [provider]  Pediatric Multivitamins-Iron (FLINTSTONES PLUS IRON PO) Take 1 tablet by mouth daily with lunch.     [provider]  potassium chloride SA (KLOR-CON) 20 MEQ tablet Take 20 mEq by mouth daily.    [provider]  sertraline (ZOLOFT) 50 MG tablet Take 100 mg by mouth daily.     [provider]  simvastatin (ZOCOR) 40 MG tablet Take 40 mg by mouth every evening.    [provider]  vitamin B-12 (CYANOCOBALAMIN) 500 MCG tablet Take 500 mcg by mouth daily.    [provider]  Vitamin D, Ergocalciferol, (DRISDOL) 1.25 MG (50000 UT) CAPS capsule Take 50,000 Units by mouth every Sunday.     [provider]    Exam: Current vital signs: BP (!) 141/81   Pulse 61    Temp (!) 97.4 F (36.3 C) (Oral)   Resp 18   Ht 5\' 4"  (1.626 m)   Wt 54.1 kg   SpO2 99%   BMI 20.47 kg/m   Physical Exam  Constitutional: Appears well-developed and well-nourished.  Psych: Affect appropriate to situation. Eyes: No scleral injection. HENT: No OP obstruction. Head: Normocephalic.  Cardiovascular: Normal rate and regular rhythm.  Respiratory: Effort normal.  GI: Abdomen soft.  No distension. There is no tenderness.  Skin: WDI  Neuro: Mental Status: Patient is awake, alert, oriented to person, place, month, year, and situation. Patient is unable to give a clear and coherent history. History taken mostly from RNs and daughter. Patient does not recall why she was rushed to CT.  No signs of neglect. Speech/Language:  Speech is clear, fluent without dysarthria or aphasia. Repetition, naming, and comprehension intact.  Cranial Nerves: II: Visual Fields are full. Pupils are equal, round, and reactive to light.  III,IV, VI: EOMI without ptosis or diploplia.  V: Facial sensation is symmetric to light touch in V1, V2, and V3. VII: Facial movement symmetrical.  VIII: hearing is intact to voice. X: Palate elevates symmetrically. XI: Shoulder shrug is symmetric. XII: tongue is midline without atrophy or fasciculations.  Motor: RUE:  grips  5/5     biceps  5/5     triceps  5/5 LUE:  grips   4+/5    biceps  4+/5     triceps  4+/5 RLE: knee  5/5     thigh   5/5     plantar flexion   5/5     dorsiflexion  5/5 LLE: knee  4+/5     thigh   4+/5      plantar flexion   4+/5     dorsiflexion   4+/5 Tone is normal. Bulk is normal.  Sensory: Sensation is symmetric to light touch in all fours extremities. Extinguishes to the left with light touch DSS.  Plantars: Toes are downgoing bilaterally.  Cerebellar: Ataxia + LUE FNF. RUE normal FNF. Marland Kitchen LLE + ataxia HKS.   RLE no ataxia.  Gait: Deferred  I have reviewed labs in epic and the pertinent results are: INR 0.9         aPTT 24          creatinine 0.89  MD reviewed the images obtained:  NCT head 1st one on 10/23/20.  Atrophy. Prior infarct involving portions of the right posterior frontal and anterior right temporal lobe. Localized calcification of a branch of the middle cerebral artery in this area is consistent with prior occlusion of this portion of  the vessel. Elsewhere there is patchy periventricular small vessel disease. No evident acute infarct. No mass or acute hemorrhage.  NCT head 2nd one during code stroke.  1. No acute abnormality and no change from earlier today 2. Chronic right MCA infarct. 3. ASPECTS is 10.  CTA head and neck  1. Negative for intracranial large vessel occlusion 2. No significant carotid or vertebral artery stenosis. 3. 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 Recommend annual imaging followup by CTA or MRA. This recommendation follows 2010.  Assessment: 75 yo female with stroke risk factors of HTN, HLD, prior stroke, and OSA. CTH and CTA head and neck were negative for acute stroke or LVO. Risks of tPA outweighed benefits due to quickly resolving symptoms. ABCD2 5.  Impression:  TIA vs Stroke.   Plan: All of below have been ordered.  - Medicine admit.  - MRI brain without contrast. - Recommend TTE. - Recommend labs: HbA1c, lipid panel, TSH -changed Zocor to Atorvastatin 40mg . Please increase dose if LDL over 70. - Aspirin load with 650mg  total. She has had 325mg  so will repeat that. Continue 325 mg po qd - Clopidogrel 150mg  po x 1, then 75mg  daily for 21 days.  - SBP goal - Permissive hypertension first 24 h < 220/110. Hold home medications for now. - Telemetry monitoring for arrhythmia. - bedside Swallow screen. - Stroke education. - PT/OT/SLP consult. - NIHSS as per protocol. - frequent neuro checks.  - Recommend metabolic/infectious workup with UA with UCx, CXR, CK, serum lactate. -stroke team to follow.    Electronically signed by:  Patient seen by Pamela Boll, MSN, APN-BC, nurse practitioner and by MD. Note/plan to be edited by MD as needed.  Pager: [phone]   I have seen and examined the patient. I have formulated the assessment and recommendations. My exam findings were observed and documented by Pamela Boll, NP.  Electronically signed: Dr. Kerney Elbe

## 2020-10-23 NOTE — ED Triage Notes (Addendum)
Pt arrived via [facility] from home for c/o left sided weakness and dizziness that lasted a min at 1000 today, then resolved. Pts LKW 1000 today. Per EMS stroke scale for them was neg. Per EMS pt was in sinus arrhythmia and RBBB on monitor which the pt told them was new. Pt is A&Ox4. Pt is NSR w/BBB and ST depression on monitor

## 2020-10-23 NOTE — Discharge Instructions (Addendum)
As discussed, clinically the episode of weakness earlier today is consistent with a TIA or transient ischemic attack.  It is important you follow-up with your physician for appropriate ongoing studies to minimize your stroke risk minimization.  Please obtain and begin taking aspirin and to be sure to continue taking your simvastatin.  Return here for concerning changes in your condition.   Wound care instructions (loop recorder/heart monitor) Keep incision clean and dry for 3 days. You can remove outer dressing tomorrow. Leave steri-strips (little pieces of tape) on until seen in the office for wound check appointment. Call the office [phone]) for redness, drainage, swelling, or fever.

## 2020-10-23 NOTE — H&P (Signed)
History and Physical    Pamela Mason HKV:[id number] DOB: 16-Apr-1946 DOA: 10/23/2020  PCP: Crist Infante, MD  Patient coming from: Home via EMS  I have personally briefly reviewed patient's old medical records in [place]  Chief Complaint: Left-sided weakness  HPI: Pamela Mason is a 75 y.o. female with medical history significant for dementia, hypertension, hyperlipidemia, sleep apnea who presented to the ED for evaluation of acute left-sided weakness.  History is limited from patient due to dementia and is otherwise obtained from [place], chart review, and patient's daughter at bedside.  Daughter states that patient had new onset left-sided weakness beginning at 89 AM earlier today (6/16).  Patient was standing up when daughter noticed that she began to sway as if she was about to fall.  Her daughter caught her and afterwards noticed that she had significant left-sided weakness.  Patient did not seem to notice the weakness with apparent left hemineglect per daughter's description.  Symptoms resolved in 5 minutes.  EMS were called and she was brought to the ED for further evaluation.  Initial CT head was performed and showed prior strokes without evident acute infarct.  She was being prepared for discharge to home when she had recurrent left-sided weakness with basically flaccid left lower extremity and discoordination of the left upper extremity prompting the call for code stroke.  ED Course:  Initial vitals showed BP 120/77, pulse 65, RR 21, temp 98.5 F, SPO2 97% on room air.  Labs show WBC 6.4, hemoglobin 12.6, platelets 224,000, sodium 139, potassium 4.6, bicarb 23, BUN 18, creatinine 0.89, serum glucose 100, LFTs within normal limits.  UDS negative.  Urinalysis negative for UTI.  Initial CT head without contrast showed prior infarct involving portions of the right posterior frontal and anterior right temporal lobe.  No acute infarct, mass, or hemorrhage.  Repeat CT head  negative for acute normality or change from prior.  CTA head/neck negative for intracranial large vessel occlusion.  No significant carotid or vertebral artery stenosis noted.  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  Patient was given aspirin 324 mg once.  Neurology was consulted and she has been started on DAPT with aspirin and Plavix.  MRI brain ordered and pending.  The hospitalist service was consulted to admit for further evaluation and management.  Review of Systems: All systems reviewed and are negative except as documented in history of present illness above.   Past Medical History:  Diagnosis Date   Anxiety    Arthritis    Complication of anesthesia    slow to wake up   Dementia ([place]) 2018   memory loss   Depression    Hyperlipidemia    Hypertension    Osteopenia    Sleep apnea     Past Surgical History:  Procedure Laterality Date   COLONOSCOPY     INTRACAPSULAR CATARACT EXTRACTION     ROTATOR CUFF REPAIR     TOTAL HIP ARTHROPLASTY Left 01/02/2019   Procedure: TOTAL HIP ARTHROPLASTY ANTERIOR APPROACH;  Surgeon: Paralee Cancel, MD;  Location: [facility];  Service: Orthopedics;  Laterality: Left;  70 mins   TUBAL LIGATION      Social History:  reports that she has quit smoking. Her smoking use included cigarettes. She has a 1.00 pack-year smoking history. She has never used smokeless tobacco. She reports current alcohol use of about 7.0 standard drinks of alcohol per week. She reports that she does not use drugs.  Allergies  Allergen Reactions   Latex     Pt stated she had a reaction to latex in the past. [name] RN   Tape Other (See Comments)    redness    Family History  Problem Relation Age of Onset   Hypertension Mother    Ovarian cancer Mother    Heart disease Father    Colon cancer Neg Hx      Prior to Admission medications   Medication Sig Start Date End Date Taking? Authorizing Provider  aspirin EC 325 MG tablet  Take 1 tablet (325 mg total) by mouth daily. 10/23/20  Yes Carmin Muskrat, MD  galantamine (RAZADYNE ER) 8 MG 24 hr capsule Take 8 mg by mouth daily with lunch.    Yes [provider]  lisinopril (PRINIVIL,ZESTRIL) 10 MG tablet Take 10 mg by mouth every evening.  01/02/15  Yes [provider]  sertraline (ZOLOFT) 100 MG tablet Take 150 mg by mouth daily. 09/18/20  Yes [provider]  simvastatin (ZOCOR) 40 MG tablet Take 40 mg by mouth every evening.   Yes [provider]  vitamin B-12 (CYANOCOBALAMIN) 500 MCG tablet Take 500 mcg by mouth daily.   Yes [provider]  Vitamin D, Ergocalciferol, (DRISDOL) 1.25 MG (50000 UT) CAPS capsule Take 50,000 Units by mouth every 7 (seven) days. thurs   Yes [provider]  docusate sodium (COLACE) 100 MG capsule Take 1 capsule (100 mg total) by mouth 2 (two) times daily. Patient not taking: Reported on 10/23/2020 01/03/19   Danae Orleans, PA-C  Pediatric Multivitamins-Iron (FLINTSTONES PLUS IRON PO) Take 1 tablet by mouth daily with lunch.     [provider]    Physical Exam: Vitals:   10/23/20 1745 10/23/20 1800 10/23/20 1815 10/23/20 1823  BP: (!) 156/95 (!) 144/93 (!) 163/94   Pulse: 65 (!) 58 (!) 49 (!) 52  Resp: 17 14 (!) 21 17  Temp:      TempSrc:      SpO2: 100% 98% (!) 86% 98%  Weight:      Height:       Constitutional: Resting supine in bed, NAD, calm, comfortable Eyes: PERRL, EOMI, lids and conjunctivae normal ENMT: Mucous membranes are moist. Posterior pharynx clear of any exudate or lesions.Normal dentition.  Neck: normal, supple, no masses. Respiratory: clear to auscultation bilaterally, no wheezing, no crackles. Normal respiratory effort. No accessory muscle use.  Cardiovascular: Regular rate and rhythm, no murmurs / rubs / gallops. No extremity edema. 2+ pedal pulses. Abdomen: no tenderness, no masses palpated. No hepatosplenomegaly. Bowel sounds positive.   Musculoskeletal: no clubbing / cyanosis. No joint deformity upper and lower extremities. Good ROM, no contractures. Normal muscle tone.  Skin: no rashes, lesions, ulcers. No induration Neurologic: CN 2-12 grossly intact. Sensation intact. Strength 5/5 in all 4.  Psychiatric: Awake, alert, oriented to self and daughter at bedside but not to situation.  Labs on Admission: I have personally reviewed following labs and imaging studies  CBC: Recent Labs  Lab 10/23/20 1133  WBC 6.4  NEUTROABS 4.8  HGB 12.6  HCT 39.1  MCV 100.3*  PLT 366   Basic Metabolic Panel: Recent Labs  Lab 10/23/20 1133  NA 139  K 4.6  CL 107  CO2 23  GLUCOSE 100*  BUN 18  CREATININE 0.89  CALCIUM 8.9   GFR: Estimated Creatinine Clearance: 46.6 mL/min (by C-G formula based on SCr of 0.89 mg/dL). Liver Function Tests: Recent Labs  Lab 10/23/20  1133  AST 28  ALT 21  ALKPHOS 69  BILITOT 1.0  PROT 6.0*  ALBUMIN 3.4*   No results for input(s): LIPASE, AMYLASE in the last 168 hours. No results for input(s): AMMONIA in the last 168 hours. Coagulation Profile: Recent Labs  Lab 10/23/20 1133  INR 0.9   Cardiac Enzymes: No results for input(s): CKTOTAL, CKMB, CKMBINDEX, TROPONINI in the last 168 hours. BNP (last 3 results) No results for input(s): PROBNP in the last 8760 hours. HbA1C: No results for input(s): HGBA1C in the last 72 hours. CBG: Recent Labs  Lab 10/23/20 1713  GLUCAP 84   Lipid Profile: No results for input(s): CHOL, HDL, LDLCALC, TRIG, CHOLHDL, LDLDIRECT in the last 72 hours. Thyroid Function Tests: No results for input(s): TSH, T4TOTAL, FREET4, T3FREE, THYROIDAB in the last 72 hours. Anemia Panel: No results for input(s): VITAMINB12, FOLATE, FERRITIN, TIBC, IRON, RETICCTPCT in the last 72 hours. Urine analysis:    Component Value Date/Time   COLORURINE STRAW (A) 10/23/2020 [place] 10/23/2020 1605   LABSPEC 1.005 10/23/2020 1605   PHURINE 5.0  10/23/2020 1605   GLUCOSEU NEGATIVE 10/23/2020 1605   HGBUR NEGATIVE 10/23/2020 1605   BILIRUBINUR NEGATIVE 10/23/2020 1605   KETONESUR 5 (A) 10/23/2020 1605   PROTEINUR NEGATIVE 10/23/2020 1605   UROBILINOGEN 0.2 01/15/2014 1631   NITRITE NEGATIVE 10/23/2020 1605   LEUKOCYTESUR TRACE (A) 10/23/2020 1605    Radiological Exams on Admission: CT HEAD WO CONTRAST  Result Date: 10/23/2020 CLINICAL DATA:  Altered mental status there is not some EXAM: CT HEAD WITHOUT CONTRAST TECHNIQUE: Contiguous axial images were obtained from the base of the skull through the vertex without intravenous contrast. COMPARISON:  November 01, 2016 brain MRI FINDINGS: Brain: There is mild diffuse atrophy. There is a mild degree of ex vacuo phenomenon involving the frontal horn of the right lateral ventricle. There is no appreciable intracranial mass, acute hemorrhage, extra-axial fluid collection, or midline shift. There is evidence of a prior infarct involving portions of the posterior right frontal and anterior right temporal lobes. Elsewhere there is decreased attenuation in portions of each centrum semiovale. No acute infarct is evident. Vascular: There is calcification in the periphery of a branch of the right middle cerebral artery in area of prior infarct. There is mild carotid siphon calcification. No hyperdense vessel evident. Skull: Bony calvarium appears intact. Sinuses/Orbits: There is mucosal thickening in several ethmoid air cells. Visualized orbits appear symmetric bilaterally. Other: Mastoid air cells are clear. There is debris in each external auditory canal. IMPRESSION: Atrophy. Prior infarct involving portions of the right posterior frontal and anterior right temporal lobe. Localized calcification of a branch of the middle cerebral artery in this area is consistent with prior occlusion of this portion of the vessel. Elsewhere there is patchy periventricular small vessel disease. No evident acute infarct. No mass  or acute hemorrhage. Mucosal thickening noted in several ethmoid air cells. Probable cerumen in each external auditory canal. Electronically Signed   By: Lowella Grip III M.D.   On: 10/23/2020 13:32   MR BRAIN WO CONTRAST  Result Date: 10/23/2020 CLINICAL DATA:  Acute neuro deficit. EXAM: MRI HEAD WITHOUT CONTRAST TECHNIQUE: Multiplanar, multiecho pulse sequences of the brain and surrounding structures were obtained without intravenous contrast. COMPARISON:  CT head 10/23/2020 FINDINGS: Brain: Scattered small areas of restricted diffusion compatible with acute infarct involving right superior insula and centrum semiovale. Small area of acute infarct in the right occipital pole. Moderate atrophy. Chronic infarct in  the right frontal lobe. Mild chronic ischemic change in the pons. No intracranial hemorrhage Sub ependymal 5 mm nodule left frontal lobe extending into the left frontal horn. Possible heterotopic gray matter. Vascular: Normal arterial flow voids Skull and upper cervical spine: Negative Sinuses/Orbits: Mild mucosal edema paranasal sinuses. Bilateral cataract extraction Other: None IMPRESSION: Small areas of acute infarct in the right superior insula and corona radiata. Small acute infarct right occipital lobe Chronic infarct right frontal lobe. Mild chronic microvascular ischemia 5 mm nodule left frontal horn possible  heterotopic gray matter. Electronically Signed   By: Franchot Gallo M.D.   On: 10/23/2020 19:37   CT HEAD CODE STROKE WO CONTRAST`  Result Date: 10/23/2020 CLINICAL DATA:  Code stroke.  Left-sided weakness EXAM: CT HEAD WITHOUT CONTRAST TECHNIQUE: Contiguous axial images were obtained from the base of the skull through the vertex without intravenous contrast. COMPARISON:  CT head earlier today 10/23/2020 FINDINGS: Brain: Chronic infarct in the right frontal lobe and right frontal operculum unchanged. There is also involvement of the anterior insula. Mild white matter  hypodensities bilaterally consistent with chronic microvascular ischemia Negative for acute infarct, hemorrhage, mass Vascular: Calcification in the right sylvian fissure consistent with right MCA calcification unchanged. Negative for acute hyperdense vessel Skull: Negative Sinuses/Orbits: Mild mucosal edema paranasal sinuses. Bilateral cataract extraction Other: None ASPECTS ([place] Stroke Program Early CT Score) - Ganglionic level infarction (caudate, lentiform nuclei, internal capsule, insula, M1-M3 cortex): 7 - Supraganglionic infarction (M4-M6 cortex): 3 Total score (0-10 with 10 being normal): 10 IMPRESSION: 1. No acute abnormality and no change from earlier today 2. Chronic right MCA infarct. 3. ASPECTS is 10 4. Code stroke imaging results were communicated on 10/23/2020 at 5:16 pm to provider Pamela Mason via text page Electronically Signed   By: Franchot Gallo M.D.   On: 10/23/2020 17:17   CT ANGIO HEAD NECK W WO CM (CODE STROKE)  Result Date: 10/23/2020 CLINICAL DATA:  Acute neuro deficit.  Rule out stroke EXAM: CT ANGIOGRAPHY HEAD AND NECK TECHNIQUE: Multidetector CT imaging of the head and neck was performed using the standard protocol during bolus administration of intravenous contrast. Multiplanar CT image reconstructions and MIPs were obtained to evaluate the vascular anatomy. Carotid stenosis measurements (when applicable) are obtained utilizing NASCET criteria, using the distal internal carotid diameter as the denominator. CONTRAST:  18mL OMNIPAQUE IOHEXOL 350 MG/ML SOLN COMPARISON:  CT head 10/23/2020 FINDINGS: CTA NECK FINDINGS Aortic arch: Dilated aortic arch. Proximal aortic arch measures 36 mm in diameter with mild atherosclerotic calcification. Proximal great vessels widely patent. Right carotid system: Mild atherosclerotic disease right carotid bifurcation without stenosis. Tortuous right internal carotid artery without stenosis or dissection. Left carotid system: Mild atherosclerotic disease  left carotid bifurcation without stenosis. Tortuosity left internal carotid artery. Vertebral arteries: Both vertebral arteries patent to the skull base without stenosis. Skeleton: Cervical spondylosis.  No acute skeletal abnormality. Other neck: Negative for mass or adenopathy in the neck. Upper chest: Mild subpleural scarring. Review of the MIP images confirms the above findings CTA HEAD FINDINGS Anterior circulation: Cavernous carotid widely patent bilaterally. Anterior and middle cerebral arteries patent bilaterally without stenosis. [place] anterior cerebral artery supplied from the right. Small left anterior cerebral artery. Posterior circulation: Both vertebral arteries patent to the basilar. PICA patent bilaterally. Basilar widely patent. Superior cerebellar and posterior cerebral arteries patent bilaterally without stenosis. Venous sinuses: Normal venous enhancement Anatomic variants: None Review of the MIP images confirms the above findings IMPRESSION: 1. Negative for intracranial large vessel occlusion  2. No significant carotid or vertebral artery stenosis. 3. 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 Recommend annual imaging followup by CTA or MRA. This recommendation follows 2010 ACCF/AHA/AATS/ACR/ASA/SCA/SCAI/SIR/STS/SVM Guidelines for the Diagnosis and Management of Patients with Thoracic Aortic Disease. Circulation.2010; 121: N003-B048. Aortic aneurysm NOS (ICD10-I71.9) m Electronically Signed   By: Franchot Gallo M.D.   On: 10/23/2020 17:38    EKG: Personally reviewed. Sinus rhythm, LBBB.  Similar to prior.  Assessment/Plan Principal Problem:   Acute cerebrovascular accident (CVA) ([place]) Active Problems:   HLD (hyperlipidemia)   HTN (hypertension)   RAYONA SARDINHA is a 75 y.o. female with medical history significant for dementia, hypertension, hyperlipidemia, sleep apnea who is admitted with acute CVA.  Acute CVA: MRI brain showing small areas of acute  infarct in the right superior insula and corona radiata and small acute infarct right occipital lobe.  CTA head/neck without LVO or significant carotid or vertebral artery stenosis. -Neurology following -Started on aspirin 81 mg and Plavix 75 mg daily -Started on atorvastatin 40 mg daily -Echocardiogram -Check A1c and lipid panel -PT/OT/SLP eval -Monitor on telemetry, continue neurochecks -Allow permissive hypertension for now  Hypertension: Holding lisinopril to allow permissive hypertension for now.  Hyperlipidemia: Continue atorvastatin.  Depression/anxiety: Continue sertraline.  OSA: Not using CPAP.  Dementia: Chronic and stable, pleasant.  Continue galantamine.  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  Annual imaging follow-up by CTA or MRA recommended.   DVT prophylaxis: Lovenox Code Status: Full code, confirmed with patient's daughter on admission Family Communication: Discussed with patient's daughter at bedside Disposition Plan: From home, dispo pending further stroke work-up and PT/OT/SLP eval Consults called: Neurology Level of care: Telemetry Medical Admission status:  Status is: Observation  The patient remains OBS appropriate and will d/c before 2 midnights.  Dispo: The patient is from: Home              Anticipated d/c is to: Home              Patient currently is not medically stable to d/c.   Difficult to place patient No   Zada Finders MD [facility]  If 7PM-7AM, please contact night-coverage [email or web address]  10/23/2020, 8:31 PM

## 2020-10-23 NOTE — ED Provider Notes (Signed)
[facility] EMERGENCY DEPARTMENT Provider Note   CSN: [id number] Arrival date & time: 10/23/20  1114     History Chief Complaint  Patient presents with   left sided weakness    Pamela Mason is a 75 y.o. female.  HPI Patient presents via EMS from home with concern for an episode of dizziness, fall. The patient self denies any complaints, is unsure of the event, duration, preceding or post event concerns. She has a history of dementia, level 5 caveat. She does seemingly, however, respond to questions regarding her current situation appropriately, denying pain, dyspnea, fatigue, weakness anywhere.     Past Medical History:  Diagnosis Date   Anxiety    Arthritis    Complication of anesthesia    slow to wake up   Dementia ([place]) 2018   memory loss   Depression    Hyperlipidemia    Hypertension    Osteopenia    Sleep apnea     Patient Active Problem List   Diagnosis Date Noted   S/P left THA, AA 01/02/2019   Pseudophakia of both eyes 12/27/2014   Anxiety 09/24/2014   Osteoporosis 09/24/2014   Combined form of senile cataract of left eye 08/30/2014   Epiretinal membrane, bilateral 10/14/2013   Vitreomacular adhesion of both eyes 10/14/2013   Conjunctivitis 03/07/2013   Open angle with borderline findings, low risk 11/22/2011   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   Osteopenia    HLD (hyperlipidemia)    HTN (hypertension)     Past Surgical History:  Procedure Laterality Date   COLONOSCOPY     INTRACAPSULAR CATARACT EXTRACTION     ROTATOR CUFF REPAIR     TOTAL HIP ARTHROPLASTY Left 01/02/2019   Procedure: TOTAL HIP ARTHROPLASTY ANTERIOR APPROACH;  Surgeon: Paralee Cancel, MD;  Location: [facility] ORS;  Service: Orthopedics;  Laterality: Left;  70 mins   TUBAL LIGATION       OB History     Gravida  5   Para  3   Term  3   Preterm      AB  2   Living  3      SAB  2   IAB      Ectopic      Multiple      Live Births               Family History  Problem Relation Age of Onset   Hypertension Mother    Ovarian cancer Mother    Heart disease Father    Colon cancer Neg Hx     Social History   Tobacco Use   Smoking status: Former    Packs/day: 0.25    Years: 4.00    Pack years: 1.00    Types: Cigarettes   Smokeless tobacco: Never  Vaping Use   Vaping Use: Never used  Substance Use Topics   Alcohol use: Yes    Alcohol/week: 7.0 standard drinks    Types: 7 Glasses of wine per week    Comment: wine daily   Drug use: No    Home Medications Prior to Admission medications   Medication Sig Start Date End Date Taking? Authorizing Provider  docusate sodium (COLACE) 100 MG capsule Take 1 capsule (100 mg total) by mouth 2 (two) times daily. 01/03/19   Danae Orleans, PA-C  galantamine (RAZADYNE ER) 8 MG 24 hr capsule Take 8 mg by mouth daily with lunch.     [provider]  lisinopril (  PRINIVIL,ZESTRIL) 10 MG tablet Take 10 mg by mouth every evening.  01/02/15   [provider]  Pediatric Multivitamins-Iron (FLINTSTONES PLUS IRON PO) Take 1 tablet by mouth daily with lunch.     [provider]  potassium chloride SA (KLOR-CON) 20 MEQ tablet Take 20 mEq by mouth daily.    [provider]  sertraline (ZOLOFT) 50 MG tablet Take 100 mg by mouth daily.     [provider]  simvastatin (ZOCOR) 40 MG tablet Take 40 mg by mouth every evening.    [provider]  vitamin B-12 (CYANOCOBALAMIN) 500 MCG tablet Take 500 mcg by mouth daily.    [provider]  Vitamin D, Ergocalciferol, (DRISDOL) 1.25 MG (50000 UT) CAPS capsule Take 50,000 Units by mouth every Sunday.     [provider]    Allergies    Latex and Tape  Review of Systems   Review of Systems  Unable to perform ROS: Dementia   Physical Exam Updated Vital Signs BP 125/84   Pulse 62   Temp 98.5 F (36.9 C) (Oral)   Resp 14   Ht 5\' 4"  (1.626 m)   Wt 54.1 kg   SpO2 95%    BMI 20.47 kg/m   Physical Exam Vitals and nursing note reviewed.  Constitutional:      General: She is not in acute distress.    Appearance: She is well-developed.  HENT:     Head: Normocephalic and atraumatic.  Eyes:     Conjunctiva/sclera: Conjunctivae normal.  Cardiovascular:     Rate and Rhythm: Normal rate and regular rhythm.  Pulmonary:     Effort: Pulmonary effort is normal. No respiratory distress.     Breath sounds: Normal breath sounds. No stridor.  Abdominal:     General: There is no distension.  Skin:    General: Skin is warm and dry.  Neurological:     Mental Status: She is alert.     Cranial Nerves: No cranial nerve deficit.     Motor: No weakness.     Coordination: Coordination normal.  Psychiatric:        Cognition and Memory: Cognition is not impaired. Memory is impaired.    ED Results / Procedures / Treatments   Labs (all labs ordered are listed, but only abnormal results are displayed) Labs Reviewed  CBC - Abnormal; Notable for the following components:      Result Value   MCV 100.3 (*)    All other components within normal limits  COMPREHENSIVE METABOLIC PANEL - Abnormal; Notable for the following components:   Glucose, Bld 100 (*)    Total Protein 6.0 (*)    Albumin 3.4 (*)    All other components within normal limits  PROTIME-INR  APTT  DIFFERENTIAL  RAPID URINE DRUG SCREEN, HOSP PERFORMED  URINALYSIS, ROUTINE W REFLEX MICROSCOPIC  I-STAT CHEM 8, ED    EKG EKG Interpretation  Date/Time:  Thursday October 23 2020 11:40:54 EDT Ventricular Rate:  63 PR Interval:  208 QRS Duration: 134 QT Interval:  459 QTC Calculation: 470 R Axis:   10 Text Interpretation: Sinus rhythm Left bundle branch block Abnormal ECG Confirmed by Carmin Muskrat [phone]) on 10/23/2020 12:12:47 PM  Radiology CT HEAD WO CONTRAST  Result Date: 10/23/2020 CLINICAL DATA:  Altered mental status there is not some EXAM: CT HEAD WITHOUT CONTRAST TECHNIQUE: Contiguous  axial images were obtained from the base of the skull through the vertex without intravenous contrast. COMPARISON:  November 01, 2016 brain MRI FINDINGS: Brain: There is mild diffuse atrophy. There is a mild degree of ex vacuo phenomenon involving the frontal horn of the right lateral ventricle. There is no appreciable intracranial mass, acute hemorrhage, extra-axial fluid collection, or midline shift. There is evidence of a prior infarct involving portions of the posterior right frontal and anterior right temporal lobes. Elsewhere there is decreased attenuation in portions of each centrum semiovale. No acute infarct is evident. Vascular: There is calcification in the periphery of a branch of the right middle cerebral artery in area of prior infarct. There is mild carotid siphon calcification. No hyperdense vessel evident. Skull: Bony calvarium appears intact. Sinuses/Orbits: There is mucosal thickening in several ethmoid air cells. Visualized orbits appear symmetric bilaterally. Other: Mastoid air cells are clear. There is debris in each external auditory canal. IMPRESSION: Atrophy. Prior infarct involving portions of the right posterior frontal and anterior right temporal lobe. Localized calcification of a branch of the middle cerebral artery in this area is consistent with prior occlusion of this portion of the vessel. Elsewhere there is patchy periventricular small vessel disease. No evident acute infarct. No mass or acute hemorrhage. Mucosal thickening noted in several ethmoid air cells. Probable cerumen in each external auditory canal. Electronically Signed   By: Lowella Grip III M.D.   On: 10/23/2020 13:32    Procedures Procedures   Medications Ordered in ED Medications  sodium chloride 0.9 % bolus 500 mL (0 mLs Intravenous Stopped 10/23/20 1403)    Followed by  0.9 %  sodium chloride infusion (100 mL/hr Intravenous New Bag/Given 10/23/20 1151)    ED Course  I have reviewed the triage vital signs  and the nursing notes.  Pertinent labs & imaging results that were available during my care of the patient were reviewed by me and considered in my medical decision making (see chart for details).   4:07 PM Daughter now present.  She and I discussed the event, the daughter was present throughout that episode. She notes that for about 10 minutes, possibly less patient had left-sided weakness, did not fall entirely, did not sustain trauma.  Symptoms were resolving when EMS arrived. Daughter notes that the patient is currently living with her, but is transitioning to independent living adult facility in 2 weeks.  She confirms history of dementia.  We will discussed possibilities for today's episode, including suspicion for TIA.  Given the patient's CT without evidence for new hemorrhage, obvious acute findings, but with suspicion for TIA as above, we discussed indication for additional medication, follow-up with primary care versus admission.  Patient has strong preference to follow-up as an outpatient, given her ability with her daughter to have close outpatient follow-up for further testing, to minimize stroke risk, this is reasonable, was accommodated.  Clinically, TIA is suspected as cause of weakness  ABCD score 3 patient is on a statin, will start full dose aspirin.  Additional antiplatelet considered, but given her fall risk, not initiated. MDM Number of Diagnoses or Management Options TIA (transient ischemic attack): new, needed workup Weakness: new, needed workup   Amount and/or Complexity of Data Reviewed Clinical lab tests: ordered and reviewed Tests in the radiology section of CPT: ordered and reviewed Tests in the medicine section of CPT: reviewed and ordered Decide to obtain previous medical records or to obtain history from someone other than the patient: yes Obtain history from someone other than the patient: yes Review and summarize past medical records: yes Independent  visualization of images,  tracings, or specimens: yes  Risk of Complications, Morbidity, and/or Mortality Presenting problems: high Diagnostic procedures: high Management options: high  Critical Care Total time providing critical care: < 30 minutes  Patient Progress Patient progress: stable   Final Clinical Impression(s) / ED Diagnoses Final diagnoses:  Weakness  TIA (transient ischemic attack)    Rx / DC Orders ED Discharge Orders          Ordered    aspirin EC 325 MG tablet  Daily        10/23/20 1613             Carmin Muskrat, MD 10/23/20 1615

## 2020-10-23 NOTE — ED Notes (Signed)
Assume care over pt, pt transferred to room 37 from room 33. Pt wet on arrival, cleaned and clean hospital gown and bed sheet given.

## 2020-10-23 NOTE — ED Provider Notes (Signed)
Patient was prepared for discharge with TIA symptoms.  She had had initial symptoms at 10 AM at home and within 5 minutes they had resolved.  Symptoms were left-sided with incoordination and weakness of the left upper and lower extremity.  Patient's diagnostic evaluation was unremarkable and she was back to baseline.  At discharge, reassessment by nursing staff for ambulation and coordination.  Patient had acute on side of left-sided weakness.  She was unable to bear weight to ambulate on the left lower extremity and had incoordination of the left upper extremity.  I have reassessed the patient.  She does have weakness of the left upper and lower extremity compared to the right as well as ataxia of the upper and lower extremity.  Mental status remains clear.  No clear visual field cut.  No clear aphasia.  Is following commands appropriately and answers questions appropriately. Physical Exam  BP 130/70   Pulse (!) 56   Temp (!) 97.4 F (36.3 C) (Oral)   Resp 18   Ht 5\' 4"  (1.626 m)   Wt 54.1 kg   SpO2 97%   BMI 20.47 kg/m   Physical Exam Constitutional:      Comments: Patient is alert.  No respiratory distress.  Clinically well in appearance.  HENT:     Head: Normocephalic and atraumatic.     Mouth/Throat:     Pharynx: Oropharynx is clear.  Eyes:     Extraocular Movements: Extraocular movements intact.  Cardiovascular:     Rate and Rhythm: Normal rate.     Pulses: Normal pulses.  Pulmonary:     Effort: Pulmonary effort is normal.  Abdominal:     General: There is no distension.     Palpations: Abdomen is soft.  Musculoskeletal:        General: No swelling.  Skin:    General: Skin is warm and dry.  Neurological:     Comments: Patient is alert.  He does follow commands appropriately.  No obvious facial droop.  Grip strength right 5\5, grip strength left 3\5.  Finger nose exam exhibits ataxia and weakness of the left upper extremity. Patient can hold right lower extremity off of  the bed for 10 seconds and has good strength to resist downward pressure.  Patient becomes weak with ataxic movement of the left lower extremity after 3 seconds of elevation.  She cannot resist downward pressure.  Psychiatric:        Mood and Affect: Mood normal.    ED Course/Procedures     Procedures CRITICAL CARE Performed by: Charlesetta Shanks   Total critical care time: 30 minutes  Critical care time was exclusive of separately billable procedures and treating other patients.  Critical care was necessary to treat or prevent imminent or life-threatening deterioration.  Critical care was time spent personally by me on the following activities: development of treatment plan with patient and/or surrogate as well as nursing, discussions with consultants, evaluation of patient's response to treatment, examination of patient, obtaining history from patient or surrogate, ordering and performing treatments and interventions, ordering and review of laboratory studies, ordering and review of radiographic studies, pulse oximetry and re-evaluation of patient's condition.  MDM  Patient upgraded to code stroke.  She has active left-sided ataxia of the upper and lower extremity as well as weakness with inability to walk without assistance.  This is a dramatic change compared to her initial evaluation.        Charlesetta Shanks, MD 10/23/20 1701

## 2020-10-24 ENCOUNTER — Inpatient Hospital Stay (HOSPITAL_COMMUNITY)
Admission: EM | Disposition: A | Discharge status: Rehabilitation Facility | Payer: Self-pay | Source: Home / Self Care | Attending: Internal Medicine

## 2020-10-24 ENCOUNTER — Inpatient Hospital Stay (HOSPITAL_COMMUNITY): Payer: Medicare Other

## 2020-10-24 ENCOUNTER — Encounter (HOSPITAL_COMMUNITY): Payer: Self-pay | Admitting: Internal Medicine

## 2020-10-24 DIAGNOSIS — I69393 Ataxia following cerebral infarction: Secondary | ICD-10-CM | POA: Diagnosis not present

## 2020-10-24 DIAGNOSIS — I69354 Hemiplegia and hemiparesis following cerebral infarction affecting left non-dominant side: Secondary | ICD-10-CM | POA: Diagnosis not present

## 2020-10-24 DIAGNOSIS — E7800 Pure hypercholesterolemia, unspecified: Secondary | ICD-10-CM | POA: Diagnosis not present

## 2020-10-24 DIAGNOSIS — I2510 Atherosclerotic heart disease of native coronary artery without angina pectoris: Secondary | ICD-10-CM | POA: Diagnosis present

## 2020-10-24 DIAGNOSIS — Z8673 Personal history of transient ischemic attack (TIA), and cerebral infarction without residual deficits: Secondary | ICD-10-CM | POA: Diagnosis not present

## 2020-10-24 DIAGNOSIS — G459 Transient cerebral ischemic attack, unspecified: Secondary | ICD-10-CM | POA: Diagnosis not present

## 2020-10-24 DIAGNOSIS — F0391 Unspecified dementia with behavioral disturbance: Secondary | ICD-10-CM | POA: Diagnosis present

## 2020-10-24 DIAGNOSIS — I639 Cerebral infarction, unspecified: Secondary | ICD-10-CM | POA: Diagnosis present

## 2020-10-24 DIAGNOSIS — I69312 Visuospatial deficit and spatial neglect following cerebral infarction: Secondary | ICD-10-CM | POA: Diagnosis not present

## 2020-10-24 DIAGNOSIS — Z20822 Contact with and (suspected) exposure to covid-19: Secondary | ICD-10-CM | POA: Diagnosis present

## 2020-10-24 DIAGNOSIS — I10 Essential (primary) hypertension: Secondary | ICD-10-CM | POA: Diagnosis not present

## 2020-10-24 DIAGNOSIS — F32A Depression, unspecified: Secondary | ICD-10-CM | POA: Diagnosis present

## 2020-10-24 DIAGNOSIS — G4733 Obstructive sleep apnea (adult) (pediatric): Secondary | ICD-10-CM | POA: Diagnosis present

## 2020-10-24 DIAGNOSIS — E559 Vitamin D deficiency, unspecified: Secondary | ICD-10-CM | POA: Diagnosis present

## 2020-10-24 DIAGNOSIS — F419 Anxiety disorder, unspecified: Secondary | ICD-10-CM | POA: Diagnosis present

## 2020-10-24 DIAGNOSIS — I6389 Other cerebral infarction: Secondary | ICD-10-CM

## 2020-10-24 DIAGNOSIS — Z8249 Family history of ischemic heart disease and other diseases of the circulatory system: Secondary | ICD-10-CM | POA: Diagnosis not present

## 2020-10-24 DIAGNOSIS — E785 Hyperlipidemia, unspecified: Secondary | ICD-10-CM | POA: Diagnosis present

## 2020-10-24 DIAGNOSIS — Z91040 Latex allergy status: Secondary | ICD-10-CM | POA: Diagnosis not present

## 2020-10-24 DIAGNOSIS — I69319 Unspecified symptoms and signs involving cognitive functions following cerebral infarction: Secondary | ICD-10-CM | POA: Diagnosis not present

## 2020-10-24 DIAGNOSIS — M1990 Unspecified osteoarthritis, unspecified site: Secondary | ICD-10-CM | POA: Diagnosis present

## 2020-10-24 DIAGNOSIS — Z91048 Other nonmedicinal substance allergy status: Secondary | ICD-10-CM | POA: Diagnosis not present

## 2020-10-24 DIAGNOSIS — R270 Ataxia, unspecified: Secondary | ICD-10-CM | POA: Diagnosis present

## 2020-10-24 DIAGNOSIS — F0390 Unspecified dementia without behavioral disturbance: Secondary | ICD-10-CM | POA: Diagnosis present

## 2020-10-24 DIAGNOSIS — I712 Thoracic aortic aneurysm, without rupture: Secondary | ICD-10-CM | POA: Diagnosis present

## 2020-10-24 DIAGNOSIS — R29704 NIHSS score 4: Secondary | ICD-10-CM | POA: Diagnosis present

## 2020-10-24 DIAGNOSIS — G8194 Hemiplegia, unspecified affecting left nondominant side: Secondary | ICD-10-CM | POA: Diagnosis present

## 2020-10-24 DIAGNOSIS — Z87891 Personal history of nicotine dependence: Secondary | ICD-10-CM | POA: Diagnosis not present

## 2020-10-24 DIAGNOSIS — Z96642 Presence of left artificial hip joint: Secondary | ICD-10-CM | POA: Diagnosis present

## 2020-10-24 DIAGNOSIS — Z79899 Other long term (current) drug therapy: Secondary | ICD-10-CM | POA: Diagnosis not present

## 2020-10-24 DIAGNOSIS — R001 Bradycardia, unspecified: Secondary | ICD-10-CM | POA: Diagnosis not present

## 2020-10-24 DIAGNOSIS — Z7902 Long term (current) use of antithrombotics/antiplatelets: Secondary | ICD-10-CM | POA: Diagnosis not present

## 2020-10-24 DIAGNOSIS — Z8041 Family history of malignant neoplasm of ovary: Secondary | ICD-10-CM | POA: Diagnosis not present

## 2020-10-24 DIAGNOSIS — Z7982 Long term (current) use of aspirin: Secondary | ICD-10-CM | POA: Diagnosis not present

## 2020-10-24 DIAGNOSIS — G4730 Sleep apnea, unspecified: Secondary | ICD-10-CM | POA: Diagnosis present

## 2020-10-24 DIAGNOSIS — K5900 Constipation, unspecified: Secondary | ICD-10-CM | POA: Diagnosis present

## 2020-10-24 HISTORY — PX: LOOP RECORDER INSERTION: EP1214

## 2020-10-24 LAB — ECHOCARDIOGRAM COMPLETE
AR max vel: 1.77 cm2
AV Area VTI: 1.9 cm2
AV Area mean vel: 1.95 cm2
AV Mean grad: 3 mmHg
AV Peak grad: 6.6 mmHg
Ao pk vel: 1.28 m/s
Area-P 1/2: 2.39 cm2
Height: 64 in
MV VTI: 1.81 cm2
S' Lateral: 2.3 cm
Weight: 1908.3 oz

## 2020-10-24 LAB — LIPID PANEL
Cholesterol: 176 mg/dL (ref 0–200)
HDL: 68 mg/dL (ref >40)
LDL Cholesterol: 91 mg/dL (ref 0–99)
Total CHOL/HDL Ratio: 2.6 RATIO
Triglycerides: 87 mg/dL (ref <150)
VLDL: 17 mg/dL (ref 0–40)

## 2020-10-24 LAB — TSH: TSH: 1.969 u[IU]/mL (ref 0.350–4.500)

## 2020-10-24 SURGERY — LOOP RECORDER INSERTION

## 2020-10-24 MED ORDER — LIDOCAINE-EPINEPHRINE 1 %-1:100000 IJ SOLN
INTRAMUSCULAR | Status: AC
  Filled 2020-10-24: qty 1

## 2020-10-24 MED ORDER — PERFLUTREN LIPID MICROSPHERE
1.0000 mL | INTRAVENOUS | Status: AC | PRN
  Administered 2020-10-24: 3 mL via INTRAVENOUS
  Filled 2020-10-24: qty 10

## 2020-10-24 MED ORDER — ATORVASTATIN CALCIUM 40 MG PO TABS
40.0000 mg | ORAL_TABLET | Freq: Once | ORAL | Status: AC
  Administered 2020-10-24: 40 mg via ORAL
  Filled 2020-10-24: qty 1

## 2020-10-24 MED ORDER — ATORVASTATIN CALCIUM 80 MG PO TABS
80.0000 mg | ORAL_TABLET | Freq: Every day | ORAL | Status: DC
  Administered 2020-10-25 – 2020-10-26 (×2): 80 mg via ORAL
  Filled 2020-10-24 (×2): qty 1

## 2020-10-24 SURGICAL SUPPLY — 2 items
MONITOR MOBILE MNGR LINQ22 (Prosthesis & Implant Heart) ×1 IMPLANT
PACK LOOP INSERTION (CUSTOM PROCEDURE TRAY) ×2 IMPLANT

## 2020-10-24 NOTE — Progress Notes (Signed)
Lower extremity venous bilateral study completed.   Please see CV Proc for preliminary results.   Kaede Clendenen, RDMS, RVT  

## 2020-10-24 NOTE — Progress Notes (Signed)
SLP Cancellation Note  Patient Details Name: BRANDEN VINE MRN: [id number] DOB: Mar 17, 1946   Cancelled treatment:       Reason Eval/Treat Not Completed: SLP screened, no needs identified, will sign off; nursing/family/pt deny any swallowing difficulty/dysphagia; she passed the [employer] screen on 10/23/20.   Elvina Sidle, M.S., [place] 10/24/2020, 10:12 AM

## 2020-10-24 NOTE — Progress Notes (Signed)
  Echocardiogram 2D Echocardiogram has been performed.  Pamela Mason 10/24/2020, 2:57 PM

## 2020-10-24 NOTE — Progress Notes (Addendum)
[facility] PROGRESS NOTE    Progress Note  Pamela Mason  GGY:[id number] DOB: 09-18-1945 DOA: 10/23/2020 PCP: Crist Infante, MD     Brief Narrative:   Pamela Mason is an 74 y.o. female past medical history significant for dementia, essential hypertension sleep apnea comes into the ED for evaluation of acute left-sided weakness which should improve, she is not a candidate for tPA initial CT performed showed old strokes, she is prepared to be discharged home when she had recurrent left-sided weakness with flaccid paralysis of the left lower extremity and discoordination, neurology was consulted recommended a CTA that was negative for intracranial large vessel occlusion, was started on antiplatelet therapy  Assessment/Plan:   Acute cerebrovascular accident (CVA) ([place]) HgbA1c pending, fasting lipid panel HDL greater than 40 LDL 91 MRI, showed small area of acute infarct in the right superior insula and corona radiata and  small infarcts in the right occipital lobe PT, OT, Speech consult pending Transthoracic Echo pending Start patient on ASA 81mg  daily and plavix 75mg  daily  Started on statins. BP goal: permissive HTN upto 220/120 mmHg Telemetry monitoring Neurology on board appriciate assistance.   HLD (hyperlipidemia) Continue Lipitor.  HTN (hypertension) Allow permissive hypertension.  Advanced dementia: Chronic and stable continue galantamine.   DVT prophylaxis: lovenox Family Communication:daughter Status is: Observation  The patient will require care spanning > 2 midnights and should be moved to inpatient because: Hemodynamically unstable  Dispo: The patient is from: Home              Anticipated d/c is to: SNF              Patient currently is not medically stable to d/c.   Difficult to place patient No     Code Status:     Code Status Orders  (From admission, onward)           Start     Ordered   10/23/20 2025  Full code  Continuous         10/23/20 2026           Code Status History     Date Active Date Inactive Code Status Order ID Comments User Context   01/02/2019 1442 01/03/2019 1439 Full Code [id number]  Danae Orleans, PA-C Inpatient      Advance Directive Documentation    Flowsheet Row Most Recent Value  Type of Advance Directive Healthcare Power of [place], Living will  Pre-existing out of facility DNR order (yellow form or pink MOST form) --  "MOST" Form in Place? --         IV Access:   Peripheral IV   Procedures and diagnostic studies:   CT HEAD WO CONTRAST  Result Date: 10/23/2020 CLINICAL DATA:  Altered mental status there is not some EXAM: CT HEAD WITHOUT CONTRAST TECHNIQUE: Contiguous axial images were obtained from the base of the skull through the vertex without intravenous contrast. COMPARISON:  November 01, 2016 brain MRI FINDINGS: Brain: There is mild diffuse atrophy. There is a mild degree of ex vacuo phenomenon involving the frontal horn of the right lateral ventricle. There is no appreciable intracranial mass, acute hemorrhage, extra-axial fluid collection, or midline shift. There is evidence of a prior infarct involving portions of the posterior right frontal and anterior right temporal lobes. Elsewhere there is decreased attenuation in portions of each centrum semiovale. No acute infarct is evident. Vascular: There is calcification in the periphery of a branch of the right middle cerebral artery  in area of prior infarct. There is mild carotid siphon calcification. No hyperdense vessel evident. Skull: Bony calvarium appears intact. Sinuses/Orbits: There is mucosal thickening in several ethmoid air cells. Visualized orbits appear symmetric bilaterally. Other: Mastoid air cells are clear. There is debris in each external auditory canal. IMPRESSION: Atrophy. Prior infarct involving portions of the right posterior frontal and anterior right temporal lobe. Localized calcification of a branch of the  middle cerebral artery in this area is consistent with prior occlusion of this portion of the vessel. Elsewhere there is patchy periventricular small vessel disease. No evident acute infarct. No mass or acute hemorrhage. Mucosal thickening noted in several ethmoid air cells. Probable cerumen in each external auditory canal. Electronically Signed   By: Lowella Grip III M.D.   On: 10/23/2020 13:32   MR BRAIN WO CONTRAST  Result Date: 10/23/2020 CLINICAL DATA:  Acute neuro deficit. EXAM: MRI HEAD WITHOUT CONTRAST TECHNIQUE: Multiplanar, multiecho pulse sequences of the brain and surrounding structures were obtained without intravenous contrast. COMPARISON:  CT head 10/23/2020 FINDINGS: Brain: Scattered small areas of restricted diffusion compatible with acute infarct involving right superior insula and centrum semiovale. Small area of acute infarct in the right occipital pole. Moderate atrophy. Chronic infarct in the right frontal lobe. Mild chronic ischemic change in the pons. No intracranial hemorrhage Sub ependymal 5 mm nodule left frontal lobe extending into the left frontal horn. Possible heterotopic gray matter. Vascular: Normal arterial flow voids Skull and upper cervical spine: Negative Sinuses/Orbits: Mild mucosal edema paranasal sinuses. Bilateral cataract extraction Other: None IMPRESSION: Small areas of acute infarct in the right superior insula and corona radiata. Small acute infarct right occipital lobe Chronic infarct right frontal lobe. Mild chronic microvascular ischemia 5 mm nodule left frontal horn possible  heterotopic gray matter. Electronically Signed   By: Franchot Gallo M.D.   On: 10/23/2020 19:37   CT HEAD CODE STROKE WO CONTRAST`  Result Date: 10/23/2020 CLINICAL DATA:  Code stroke.  Left-sided weakness EXAM: CT HEAD WITHOUT CONTRAST TECHNIQUE: Contiguous axial images were obtained from the base of the skull through the vertex without intravenous contrast. COMPARISON:  CT head  earlier today 10/23/2020 FINDINGS: Brain: Chronic infarct in the right frontal lobe and right frontal operculum unchanged. There is also involvement of the anterior insula. Mild white matter hypodensities bilaterally consistent with chronic microvascular ischemia Negative for acute infarct, hemorrhage, mass Vascular: Calcification in the right sylvian fissure consistent with right MCA calcification unchanged. Negative for acute hyperdense vessel Skull: Negative Sinuses/Orbits: Mild mucosal edema paranasal sinuses. Bilateral cataract extraction Other: None ASPECTS ([place] Stroke Program Early CT Score) - Ganglionic level infarction (caudate, lentiform nuclei, internal capsule, insula, M1-M3 cortex): 7 - Supraganglionic infarction (M4-M6 cortex): 3 Total score (0-10 with 10 being normal): 10 IMPRESSION: 1. No acute abnormality and no change from earlier today 2. Chronic right MCA infarct. 3. ASPECTS is 10 4. Code stroke imaging results were communicated on 10/23/2020 at 5:16 pm to provider [place] via text page Electronically Signed   By: Franchot Gallo M.D.   On: 10/23/2020 17:17   CT ANGIO HEAD NECK W WO CM (CODE STROKE)  Result Date: 10/23/2020 CLINICAL DATA:  Acute neuro deficit.  Rule out stroke EXAM: CT ANGIOGRAPHY HEAD AND NECK TECHNIQUE: Multidetector CT imaging of the head and neck was performed using the standard protocol during bolus administration of intravenous contrast. Multiplanar CT image reconstructions and MIPs were obtained to evaluate the vascular anatomy. Carotid stenosis measurements (when applicable) are obtained utilizing  NASCET criteria, using the distal internal carotid diameter as the denominator. CONTRAST:  29mL OMNIPAQUE IOHEXOL 350 MG/ML SOLN COMPARISON:  CT head 10/23/2020 FINDINGS: CTA NECK FINDINGS Aortic arch: Dilated aortic arch. Proximal aortic arch measures 36 mm in diameter with mild atherosclerotic calcification. Proximal great vessels widely patent. Right carotid system:  Mild atherosclerotic disease right carotid bifurcation without stenosis. Tortuous right internal carotid artery without stenosis or dissection. Left carotid system: Mild atherosclerotic disease left carotid bifurcation without stenosis. Tortuosity left internal carotid artery. Vertebral arteries: Both vertebral arteries patent to the skull base without stenosis. Skeleton: Cervical spondylosis.  No acute skeletal abnormality. Other neck: Negative for mass or adenopathy in the neck. Upper chest: Mild subpleural scarring. Review of the MIP images confirms the above findings CTA HEAD FINDINGS Anterior circulation: Cavernous carotid widely patent bilaterally. Anterior and middle cerebral arteries patent bilaterally without stenosis. [place] anterior cerebral artery supplied from the right. Small left anterior cerebral artery. Posterior circulation: Both vertebral arteries patent to the basilar. PICA patent bilaterally. Basilar widely patent. Superior cerebellar and posterior cerebral arteries patent bilaterally without stenosis. Venous sinuses: Normal venous enhancement Anatomic variants: None Review of the MIP images confirms the above findings IMPRESSION: 1. Negative for intracranial large vessel occlusion 2. No significant carotid or vertebral artery stenosis. 3. 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 Recommend annual imaging followup by CTA or MRA. This recommendation follows 2010 ACCF/AHA/AATS/ACR/ASA/SCA/SCAI/SIR/STS/SVM Guidelines for the Diagnosis and Management of Patients with Thoracic Aortic Disease. Circulation.2010; 121: D741-O878. Aortic aneurysm NOS (ICD10-I71.9) m Electronically Signed   By: Franchot Gallo M.D.   On: 10/23/2020 17:38     Medical Consultants:   None.   Subjective:    Pamela Mason no complains  Objective:    Vitals:   10/23/20 2257 10/24/20 0130 10/24/20 0352 10/24/20 0548  BP: 115/76 116/80 110/62 127/81  Pulse: (!) 58 (!) 53 (!) 55 (!)  55  Resp: 17 19 18 19   Temp: 98.2 F (36.8 C) 98.2 F (36.8 C) 98 F (36.7 C) 99 F (37.2 C)  TempSrc: Oral Oral Oral Oral  SpO2: 95% 93% 93% 96%  Weight:      Height:       SpO2: 96 %   Intake/Output Summary (Last 24 hours) at 10/24/2020 0654 Last data filed at 10/24/2020 0600 Gross per 24 hour  Intake 1781.43 ml  Output 300 ml  Net 1481.43 ml   Filed Weights   10/23/20 1121  Weight: 54.1 kg    Exam: General exam: In no acute distress. Respiratory system: Good air movement and clear to auscultation. Cardiovascular system: S1 & S2 heard, RRR. No JVD. Extremities: No pedal edema. Skin: No rashes, lesions or ulcers  Data Reviewed:    Labs: Basic Metabolic Panel: Recent Labs  Lab 10/23/20 1133  NA 139  K 4.6  CL 107  CO2 23  GLUCOSE 100*  BUN 18  CREATININE 0.89  CALCIUM 8.9   GFR Estimated Creatinine Clearance: 46.6 mL/min (by C-G formula based on SCr of 0.89 mg/dL). Liver Function Tests: Recent Labs  Lab 10/23/20 1133  AST 28  ALT 21  ALKPHOS 69  BILITOT 1.0  PROT 6.0*  ALBUMIN 3.4*   No results for input(s): LIPASE, AMYLASE in the last 168 hours. No results for input(s): AMMONIA in the last 168 hours. Coagulation profile Recent Labs  Lab 10/23/20 1133  INR 0.9   COVID-19 Labs  No results for input(s): DDIMER, FERRITIN, LDH, CRP in  the last 72 hours.  Lab Results  Component Value Date   SARSCOV2NAA NEGATIVE 10/23/2020   SARSCOV2NAA NEGATIVE 01/19/2019   [place] NEGATIVE 12/29/2018    CBC: Recent Labs  Lab 10/23/20 1133  WBC 6.4  NEUTROABS 4.8  HGB 12.6  HCT 39.1  MCV 100.3*  PLT 224   Cardiac Enzymes: No results for input(s): CKTOTAL, CKMB, CKMBINDEX, TROPONINI in the last 168 hours. BNP (last 3 results) No results for input(s): PROBNP in the last 8760 hours. CBG: Recent Labs  Lab 10/23/20 1713  GLUCAP 84   D-Dimer: No results for input(s): DDIMER in the last 72 hours. Hgb A1c: No results for input(s): HGBA1C  in the last 72 hours. Lipid Profile: Recent Labs    10/24/20 0423  CHOL 176  HDL 68  LDLCALC 91  TRIG 87  CHOLHDL 2.6   Thyroid function studies: Recent Labs    10/24/20 0423  TSH 1.969   Anemia work up: No results for input(s): VITAMINB12, FOLATE, FERRITIN, TIBC, IRON, RETICCTPCT in the last 72 hours. Sepsis Labs: Recent Labs  Lab 10/23/20 1133  WBC 6.4   Microbiology Recent Results (from the past 240 hour(s))  Resp Panel by RT-PCR (Flu A&B, Covid) Nasopharyngeal Swab     Status: None   Collection Time: 10/23/20  7:25 PM   Specimen: Nasopharyngeal Swab; Nasopharyngeal(NP) swabs in vial transport medium  Result Value Ref Range Status   SARS Coronavirus 2 by RT PCR NEGATIVE NEGATIVE Final    Comment: (NOTE) SARS-CoV-2 target nucleic acids are NOT DETECTED.  The SARS-CoV-2 RNA is generally detectable in upper respiratory specimens during the acute phase of infection. The lowest concentration of SARS-CoV-2 viral copies this assay can detect is 138 copies/mL. A negative result does not preclude SARS-Cov-2 infection and should not be used as the sole basis for treatment or other patient management decisions. A negative result may occur with  improper specimen collection/handling, submission of specimen other than nasopharyngeal swab, presence of viral mutation(s) within the areas targeted by this assay, and inadequate number of viral copies(<138 copies/mL). A negative result must be combined with clinical observations, patient history, and epidemiological information. The expected result is Negative.  Fact Sheet for Patients:  EntrepreneurPulse.com.au  Fact Sheet for Healthcare Providers:  IncredibleEmployment.be  This test is no t yet approved or cleared by the [place] FDA and  has been authorized for detection and/or diagnosis of SARS-CoV-2 by FDA under an Emergency Use Authorization (EUA). This EUA will remain  in effect  (meaning this test can be used) for the duration of the COVID-19 declaration under Section 564(b)(1) of the Act, 21 U.S.C.section 360bbb-3(b)(1), unless the authorization is terminated  or revoked sooner.       Influenza A by PCR NEGATIVE NEGATIVE Final   Influenza B by PCR NEGATIVE NEGATIVE Final    Comment: (NOTE) The Xpert Xpress SARS-CoV-2/FLU/RSV plus assay is intended as an aid in the diagnosis of influenza from Nasopharyngeal swab specimens and should not be used as a sole basis for treatment. Nasal washings and aspirates are unacceptable for Xpert Xpress SARS-CoV-2/FLU/RSV testing.  Fact Sheet for Patients: EntrepreneurPulse.com.au  Fact Sheet for Healthcare Providers: IncredibleEmployment.be  This test is not yet approved or cleared by the [place] FDA and has been authorized for detection and/or diagnosis of SARS-CoV-2 by FDA under an Emergency Use Authorization (EUA). This EUA will remain in effect (meaning this test can be used) for the duration of the COVID-19 declaration under Section 564(b)(1) of the  Act, 21 U.S.C. section 360bbb-3(b)(1), unless the authorization is terminated or revoked.  Performed at [facility], [place] [address] [place], [place] [address]      Medications:    aspirin EC  81 mg Oral Daily   aspirin  325 mg Oral NOW   atorvastatin  40 mg Oral Daily   clopidogrel  150 mg Oral NOW   clopidogrel  75 mg Oral Daily   enoxaparin (LOVENOX) injection  40 mg Subcutaneous Q24H   galantamine  8 mg Oral Q lunch   sertraline  150 mg Oral Daily   Continuous Infusions:  sodium chloride 100 mL/hr (10/24/20 0600)      LOS: 0 days   Charlynne Cousins  [facility]  10/24/2020, 6:54 AM

## 2020-10-24 NOTE — Plan of Care (Signed)
  Problem: Education: Goal: Knowledge of General Education information will improve Description: Including pain rating scale, medication(s)/side effects and non-pharmacologic comfort measures Outcome: Progressing   Problem: Health Behavior/Discharge Planning: Goal: Ability to manage health-related needs will improve Outcome: Progressing   Problem: Clinical Measurements: Goal: Ability to maintain clinical measurements within normal limits will improve Outcome: Progressing Goal: Will remain free from infection Outcome: Progressing Goal: Diagnostic test results will improve Outcome: Progressing Goal: Respiratory complications will improve Outcome: Progressing Goal: Cardiovascular complication will be avoided Outcome: Progressing   Problem: Activity: Goal: Risk for activity intolerance will decrease Outcome: Progressing   Problem: Nutrition: Goal: Adequate nutrition will be maintained Outcome: Progressing   Problem: Coping: Goal: Level of anxiety will decrease Outcome: Progressing   Problem: Elimination: Goal: Will not experience complications related to bowel motility Outcome: Progressing Goal: Will not experience complications related to urinary retention Outcome: Progressing   Problem: Pain Managment: Goal: General experience of comfort will improve Outcome: Progressing   Problem: Safety: Goal: Ability to remain free from injury will improve Outcome: Progressing   Problem: Skin Integrity: Goal: Risk for impaired skin integrity will decrease Outcome: Progressing   Problem: Education: Goal: Knowledge of disease or condition will improve Outcome: Progressing Goal: Knowledge of secondary prevention will improve Outcome: Progressing Goal: Knowledge of patient specific risk factors addressed and post discharge goals established will improve Outcome: Progressing Goal: Individualized Educational Video(s) Outcome: Progressing   Problem: Education: Goal: Knowledge  of disease or condition will improve Outcome: Progressing Goal: Knowledge of secondary prevention will improve Outcome: Progressing Goal: Knowledge of patient specific risk factors addressed and post discharge goals established will improve Outcome: Progressing   Problem: Coping: Goal: Will verbalize positive feelings about self Outcome: Progressing Goal: Will identify appropriate support needs Outcome: Progressing   Problem: Health Behavior/Discharge Planning: Goal: Ability to manage health-related needs will improve Outcome: Progressing   Problem: Ischemic Stroke/TIA Tissue Perfusion: Goal: Complications of ischemic stroke/TIA will be minimized Outcome: Progressing

## 2020-10-24 NOTE — Consult Note (Addendum)
ELECTROPHYSIOLOGY CONSULT NOTE  Patient ID: Pamela Mason MRN: [id number], DOB/AGE: [birth year]   Admit date: 10/23/2020 Date of Consult: 10/24/2020  Primary Physician: Crist Infante, MD Primary Cardiologist: none Reason for Consultation: Cryptogenic stroke ; recommendations regarding Implantable Loop Recorder, requested by Dr. Erlinda Hong  History of Present Illness Pamela Mason was admitted on 10/23/2020 with transient L sided weakness, initially evaluated and dx with TIA, though with recurrent symptoms further w/u completed and found with stroke.    PMHx includes: prior stroke, dementia, HTN, HLD, depression  Neurology notes: MR brain showed small areas of acute infarct in the right superior insula and corona radiata and small acute infarct right occipital lobe which could be the cause of these symptoms, EP was called to consider loop implant.  she has undergone workup for stroke including echocardiogram and carotid angio.  The patient has been monitored on telemetry which has demonstrated sinus rhythm with no arrhythmias.    Neurology has deferred TEE  Echocardiogram this admission demonstrated  Is completed pending read  LE venous dopplers are in progress    Lab work is reviewed.   She is recovering from their stroke with plans to CIR, (possibly this weekend) at discharge.   Past Medical History:  Diagnosis Date   Anxiety    Arthritis    Complication of anesthesia    slow to wake up   Dementia ([place]) 2018   memory loss   Depression    Hyperlipidemia    Hypertension    Osteopenia    Sleep apnea      Surgical History:  Past Surgical History:  Procedure Laterality Date   COLONOSCOPY     INTRACAPSULAR CATARACT EXTRACTION     ROTATOR CUFF REPAIR     TOTAL HIP ARTHROPLASTY Left 01/02/2019   Procedure: TOTAL HIP ARTHROPLASTY ANTERIOR APPROACH;  Surgeon: Paralee Cancel, MD;  Location: [facility];  Service: Orthopedics;  Laterality: Left;  70 mins   TUBAL LIGATION        Medications Prior to Admission  Medication Sig Dispense Refill Last Dose   galantamine (RAZADYNE ER) 8 MG 24 hr capsule Take 8 mg by mouth daily with lunch.    10/22/2020   lisinopril (PRINIVIL,ZESTRIL) 10 MG tablet Take 10 mg by mouth every evening.   11 10/22/2020   sertraline (ZOLOFT) 100 MG tablet Take 150 mg by mouth daily.   10/23/2020   simvastatin (ZOCOR) 40 MG tablet Take 40 mg by mouth every evening.   10/22/2020   vitamin B-12 (CYANOCOBALAMIN) 500 MCG tablet Take 500 mcg by mouth daily.   10/23/2020   Vitamin D, Ergocalciferol, (DRISDOL) 1.25 MG (50000 UT) CAPS capsule Take 50,000 Units by mouth every 7 (seven) days. thurs   10/23/2020   docusate sodium (COLACE) 100 MG capsule Take 1 capsule (100 mg total) by mouth 2 (two) times daily. (Patient not taking: Reported on 10/23/2020) 28 capsule 0 Not Taking   Pediatric Multivitamins-Iron (FLINTSTONES PLUS IRON PO) Take 1 tablet by mouth daily with lunch.        Inpatient Medications:   aspirin EC  81 mg Oral Daily   aspirin  325 mg Oral NOW   [START ON 10/25/2020] atorvastatin  80 mg Oral Daily   clopidogrel  150 mg Oral NOW   clopidogrel  75 mg Oral Daily   enoxaparin (LOVENOX) injection  40 mg Subcutaneous Q24H   galantamine  8 mg Oral Q lunch   sertraline  150 mg Oral Daily  Allergies:  Allergies  Allergen Reactions   Latex     Pt stated she had a reaction to latex in the past. [name] RN   Tape Other (See Comments)    redness    Social History   Socioeconomic History   Marital status: Widowed    Spouse name: Not on file   Number of children: Not on file   Years of education: Not on file   Highest education level: Not on file  Occupational History   Not on file  Tobacco Use   Smoking status: Former    Packs/day: 0.25    Years: 4.00    Pack years: 1.00    Types: Cigarettes   Smokeless tobacco: Never  Vaping Use   Vaping Use: Never used  Substance and Sexual Activity   Alcohol use: Yes    Alcohol/week:  7.0 standard drinks    Types: 7 Glasses of wine per week    Comment: wine daily   Drug use: No   Sexual activity: Not Currently    Birth control/protection: Post-menopausal    Comment: 1st intercourse 44 yrs-Fewer than 5 partners  Other Topics Concern   Not on file  Social History Narrative   Not on file   Social Determinants of Health   Financial Resource Strain: Not on file  Food Insecurity: Not on file  Transportation Needs: Not on file  Physical Activity: Not on file  Stress: Not on file  Social Connections: Not on file  Intimate Partner Violence: Not on file     Family History  Problem Relation Age of Onset   Hypertension Mother    Ovarian cancer Mother    Heart disease Father    Colon cancer Neg Hx       Review of Systems: All other systems reviewed and are otherwise negative except as noted above.  Physical Exam: Vitals:   10/24/20 0352 10/24/20 0548 10/24/20 0802 10/24/20 1108  BP: 110/62 127/81 112/66 126/74  Pulse: (!) 55 (!) 55 (!) 52 60  Resp: 18 19 18 18   Temp: 98 F (36.7 C) 99 F (37.2 C) 98 F (36.7 C) 98.2 F (36.8 C)  TempSrc: Oral Oral Oral Oral  SpO2: 93% 96% 95% 95%  Weight:      Height:         GEN- The patient is well appearing, alert and oriented x 3 today.   Head- normocephalic, atraumatic Eyes-  Sclera clear, conjunctiva pink Ears- hearing intact Oropharynx- clear Neck- supple Lungs- CTA b/l, normal work of breathing Heart- RRR, no murmurs, rubs or gallops  GI- soft, NT, ND Extremities- no clubbing, cyanosis, or edema MS- no significant deformity or atrophy Skin- no rash or lesion Psych- euthymic mood, full affect   Labs:   Lab Results  Component Value Date   WBC 6.4 10/23/2020   HGB 12.6 10/23/2020   HCT 39.1 10/23/2020   MCV 100.3 (H) 10/23/2020   PLT 224 10/23/2020    Recent Labs  Lab 10/23/20 1133  NA 139  K 4.6  CL 107  CO2 23  BUN 18  CREATININE 0.89  CALCIUM 8.9  PROT 6.0*  BILITOT 1.0  ALKPHOS  69  ALT 21  AST 28  GLUCOSE 100*   No results found for: CKTOTAL, CKMB, CKMBINDEX, TROPONINI Lab Results  Component Value Date   CHOL 176 10/24/2020   Lab Results  Component Value Date   HDL 68 10/24/2020   Lab Results  Component Value Date  [place] 91 10/24/2020   Lab Results  Component Value Date   TRIG 87 10/24/2020   Lab Results  Component Value Date   CHOLHDL 2.6 10/24/2020   No results found for: LDLDIRECT  No results found for: DDIMER   Radiology/Studies:  CT HEAD WO CONTRAST Result Date: 10/23/2020 CLINICAL DATA:  Altered mental status there is not some EXAM: CT HEAD WITHOUT CONTRAST TECHNIQUE: Contiguous axial images were obtained from the base of the skull through the vertex without intravenous contrast. COMPARISON:  November 01, 2016 brain MRI FINDINGS: Brain: There is mild diffuse atrophy. There is a mild degree of ex vacuo phenomenon involving the frontal horn of the right lateral ventricle. There is no appreciable intracranial mass, acute hemorrhage, extra-axial fluid collection, or midline shift. There is evidence of a prior infarct involving portions of the posterior right frontal and anterior right temporal lobes. Elsewhere there is decreased attenuation in portions of each centrum semiovale. No acute infarct is evident. Vascular: There is calcification in the periphery of a branch of the right middle cerebral artery in area of prior infarct. There is mild carotid siphon calcification. No hyperdense vessel evident. Skull: Bony calvarium appears intact. Sinuses/Orbits: There is mucosal thickening in several ethmoid air cells. Visualized orbits appear symmetric bilaterally. Other: Mastoid air cells are clear. There is debris in each external auditory canal. IMPRESSION: Atrophy. Prior infarct involving portions of the right posterior frontal and anterior right temporal lobe. Localized calcification of a branch of the middle cerebral artery in this area is consistent with  prior occlusion of this portion of the vessel. Elsewhere there is patchy periventricular small vessel disease. No evident acute infarct. No mass or acute hemorrhage. Mucosal thickening noted in several ethmoid air cells. Probable cerumen in each external auditory canal. Electronically Signed   By: Lowella Grip III M.D.   On: 10/23/2020 13:32    MR BRAIN WO CONTRAST Result Date: 10/23/2020 CLINICAL DATA:  Acute neuro deficit. EXAM: MRI HEAD WITHOUT CONTRAST TECHNIQUE: Multiplanar, multiecho pulse sequences of the brain and surrounding structures were obtained without intravenous contrast. COMPARISON:  CT head 10/23/2020 FINDINGS: Brain: Scattered small areas of restricted diffusion compatible with acute infarct involving right superior insula and centrum semiovale. Small area of acute infarct in the right occipital pole. Moderate atrophy. Chronic infarct in the right frontal lobe. Mild chronic ischemic change in the pons. No intracranial hemorrhage Sub ependymal 5 mm nodule left frontal lobe extending into the left frontal horn. Possible heterotopic gray matter. Vascular: Normal arterial flow voids Skull and upper cervical spine: Negative Sinuses/Orbits: Mild mucosal edema paranasal sinuses. Bilateral cataract extraction Other: None IMPRESSION: Small areas of acute infarct in the right superior insula and corona radiata. Small acute infarct right occipital lobe Chronic infarct right frontal lobe. Mild chronic microvascular ischemia 5 mm nodule left frontal horn possible  heterotopic gray matter. Electronically Signed   By: Franchot Gallo M.D.   On: 10/23/2020 19:37    CT HEAD CODE STROKE WO CONTRAST` Result Date: 10/23/2020 CLINICAL DATA:  Code stroke.  Left-sided weakness EXAM: CT HEAD WITHOUT CONTRAST TECHNIQUE: Contiguous axial images were obtained from the base of the skull through the vertex without intravenous contrast. COMPARISON:  CT head earlier today 10/23/2020 FINDINGS: Brain: Chronic infarct  in the right frontal lobe and right frontal operculum unchanged. There is also involvement of the anterior insula. Mild white matter hypodensities bilaterally consistent with chronic microvascular ischemia Negative for acute infarct, hemorrhage, mass Vascular: Calcification in the right sylvian fissure  consistent with right MCA calcification unchanged. Negative for acute hyperdense vessel Skull: Negative Sinuses/Orbits: Mild mucosal edema paranasal sinuses. Bilateral cataract extraction Other: None ASPECTS ([place] Stroke Program Early CT Score) - Ganglionic level infarction (caudate, lentiform nuclei, internal capsule, insula, M1-M3 cortex): 7 - Supraganglionic infarction (M4-M6 cortex): 3 Total score (0-10 with 10 being normal): 10 IMPRESSION: 1. No acute abnormality and no change from earlier today 2. Chronic right MCA infarct. 3. ASPECTS is 10 4. Code stroke imaging results were communicated on 10/23/2020 at 5:16 pm to provider [place] via text page Electronically Signed   By: Franchot Gallo M.D.   On: 10/23/2020 17:17    CT ANGIO HEAD NECK W WO CM (CODE STROKE) Result Date: 10/23/2020 CLINICAL DATA:  Acute neuro deficit.  Rule out stroke EXAM: CT ANGIOGRAPHY HEAD AND NECK TECHNIQUE: Multidetector CT imaging of the head and neck was performed using the standard protocol during bolus administration of intravenous contrast. Multiplanar CT image reconstructions and MIPs were obtained to evaluate the vascular anatomy. Carotid stenosis measurements (when applicable) are obtained utilizing NASCET criteria, using the distal internal carotid diameter as the denominator. CONTRAST:  33mL OMNIPAQUE IOHEXOL 350 MG/ML SOLN COMPARISON:  CT head 10/23/2020 FINDINGS: CTA NECK FINDINGS Aortic arch: Dilated aortic arch. Proximal aortic arch measures 36 mm in diameter with mild atherosclerotic calcification. Proximal great vessels widely patent. Right carotid system: Mild atherosclerotic disease right carotid bifurcation  without stenosis. Tortuous right internal carotid artery without stenosis or dissection. Left carotid system: Mild atherosclerotic disease left carotid bifurcation without stenosis. Tortuosity left internal carotid artery. Vertebral arteries: Both vertebral arteries patent to the skull base without stenosis. Skeleton: Cervical spondylosis.  No acute skeletal abnormality. Other neck: Negative for mass or adenopathy in the neck. Upper chest: Mild subpleural scarring. Review of the MIP images confirms the above findings CTA HEAD FINDINGS Anterior circulation: Cavernous carotid widely patent bilaterally. Anterior and middle cerebral arteries patent bilaterally without stenosis. Azygos anterior cerebral artery supplied from the right. Small left anterior cerebral artery. Posterior circulation: Both vertebral arteries patent to the basilar. PICA patent bilaterally. Basilar widely patent. Superior cerebellar and posterior cerebral arteries patent bilaterally without stenosis. Venous sinuses: Normal venous enhancement Anatomic variants: None Review of the MIP images confirms the above findings IMPRESSION: 1. Negative for intracranial large vessel occlusion 2. No significant carotid or vertebral artery stenosis. 3. 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 Recommend annual imaging followup by CTA or MRA. This recommendation follows 2010 ACCF/AHA/AATS/ACR/ASA/SCA/SCAI/SIR/STS/SVM Guidelines for the Diagnosis and Management of Patients with Thoracic Aortic Disease. Circulation.2010; 121: B510-C585. Aortic aneurysm NOS (ICD10-I71.9) m Electronically Signed   By: Franchot Gallo M.D.   On: 10/23/2020 17:38    12-lead ECG SR All prior EKG's in EPIC reviewed with no documented atrial fibrillation  Telemetry SR, brief PAT  Assessment and Plan:  1. Cryptogenic stroke The patient presents with cryptogenic stroke.  Dr. Curt Bears has seen the patient and spoke at length with the patient and her  daughter at bedside about monitoring for afib with either a 30 day event monitor or an implantable loop recorder.  Risks, benefits, and alteratives to implantable loop recorder were discussed with the patient today.   At this time, the patient is very clear in her decision to proceed with implantable loop recorder, pending trhe results of the echo and LE doppler studies.   If these are unrevealing for etiology of stroke, we Ayliana Casciano proceed with loop implant  Wound care was reviewed  with the patient (keep incision clean and dry for 3 days).  Wound check follow up Tanicka Bisaillon be scheduled for the patient.  Please call with questions.   Baldwin Jamaica, PA-C 10/24/2020  I have seen and examined this patient with Tommye Standard.  Agree with above, note added to reflect my findings.  On exam, RRR, no murmurs.  Patient presented to the hospital with cryptogenic stroke. To date, no cause has been found. Elizette Shek plan for LINQ monitor to look for atrial fibrillation. Risks and benefits discussed. Risks include but not limited to bleeding and infection. The patient understands the risks and has agreed to the procedure.  Fusaye Wachtel M. Tasia Liz MD 10/24/2020 4:07 PM

## 2020-10-24 NOTE — Evaluation (Signed)
Occupational Therapy Evaluation Patient Details Name: Pamela Mason MRN: [id number] DOB: 09-06-1945 Today's Date: 10/24/2020    History of Present Illness 75 y/o female presented to ED on 6/16 for L sided weakness and dizziness with resolution of symptoms en route to hospital. CT head (-) for acute infarct or acute hemorrhage. Code stroke called in ED for new onset L sided weakness, L limb ataxia, and L neglect when preparing for d/c. CTH (-) for acute infact, but showed chronic R MCA infarct. MRI found small areas of acute infarct in R superior insula and corona radiata and small acute infarct in R occipital lobe. PMH: hx of dementia, HTN, HLD, sleep apnea   Clinical Impression   PTA, pt was independent with ADLs and light IADLs; daughter living with pt for the past month. Currently, pt requires max A for functional mobility and LB ADLs. Pt presenting with decreased balance, strength, STM, awareness, attention, and safety. Due to pt family support, motivation, and significant change in functional status, recommend discharge to CIR and will continue to follow acutely to optimize safety and independence in ADLs.     Follow Up Recommendations  CIR    Equipment Recommendations  None recommended by OT    Recommendations for Other Services Rehab consult     Precautions / Restrictions Precautions Precautions: Fall Precaution Comments: L inattention, ataxia Restrictions Weight Bearing Restrictions: No      Mobility Bed Mobility Overal bed mobility: Needs Assistance Bed Mobility: Supine to Sit;Sit to Supine     Supine to sit: Min guard Sit to supine: Min guard   General bed mobility comments: min guard for safety, no physical assist required    Transfers Overall transfer level: Needs assistance Equipment used: 1 person hand held assist Transfers: Sit to/from Stand Sit to Stand: Min assist         General transfer comment: Min A to rise and steady.    Balance Overall  balance assessment: Needs assistance Sitting-balance support: Feet supported Sitting balance-Leahy Scale: Fair Sitting balance - Comments: Pt able to don socks sitting EOB with min guard A- Min A and mod verbal cues for sitting balance Postural control: Posterior lean Standing balance support: Single extremity supported;During functional activity Standing balance-Leahy Scale: Zero Standing balance comment: required maxA to maintain standing during ambulation                           ADL either performed or assessed with clinical judgement   ADL Overall ADL's : Needs assistance/impaired Eating/Feeding: Supervision/ safety;Sitting;Set up   Grooming: Oral care;Standing;Moderate assistance Grooming Details (indicate cue type and reason): Pt attempting to apply toothpaste to toothbrush with lid on. Pt able to self correct. Requiring mod-max A for standing balance. Upper Body Bathing: Minimal assistance;Sitting;Cueing for sequencing;Cueing for safety   Lower Body Bathing: Maximal assistance;Sit to/from stand   Upper Body Dressing : Minimal assistance;Cueing for sequencing;Sitting   Lower Body Dressing: Maximal assistance;Sit to/from stand Lower Body Dressing Details (indicate cue type and reason): Pt donning socks sitting EOB with min guard and intermittent min A, however, Pt requiring max A forbalance during standing tasks. Toilet Transfer: Minimal assistance;BSC;Ambulation Armed forces technical officer Details (indicate cue type and reason): Min A to achieve upright posture when standing from [facility] Toileting- Clothing Manipulation and Hygiene: Maximal assistance;Sit to/from stand       Functional mobility during ADLs: Maximal assistance General ADL Comments: Pt requiring max A with handheld assist during functional mobility within  room due to decresed balance and ataxia.     Vision Baseline Vision/History: Wears glasses Wears Glasses: At all times       Perception  Perception Perception Tested?: Yes Perception Deficits: Inattention/neglect Inattention/Neglect: Does not attend to left side of body   Praxis      Pertinent Vitals/Pain Pain Assessment: No/denies pain     Hand Dominance Right   Extremity/Trunk Assessment Upper Extremity Assessment Upper Extremity Assessment: Generalized weakness;LUE deficits/detail LUE Deficits / Details: Pt with decreased ROM in shoulder flexion, and slightly weaker grip strength of L hand (4/5) as compared to R hand (4+/5). Able to perform finger opposition with increased effort. Pt with decreased coordination as seen by undershooting when completing finger-nose test with LUE.   Lower Extremity Assessment Lower Extremity Assessment: Defer to PT evaluation LLE Deficits / Details: grossly 4+/5 with MMT but demonstrates functional weakness vs inattention/ataxia LLE Sensation: decreased proprioception LLE Coordination: decreased fine motor;decreased gross motor   Cervical / Trunk Assessment Cervical / Trunk Assessment: Other exceptions Cervical / Trunk Exceptions: mild truncal ataxia noted in sitting   Communication Communication Communication: No difficulties   Cognition Arousal/Alertness: Awake/alert Behavior During Therapy: WFL for tasks assessed/performed Overall Cognitive Status: History of cognitive impairments - at baseline Area of Impairment: Following commands;Memory;Attention;Safety/judgement;Awareness;Problem solving                   Current Attention Level: Sustained;Selective Memory: Decreased short-term memory Following Commands: Follows one step commands with increased time Safety/Judgement: Decreased awareness of safety;Decreased awareness of deficits Awareness: Emergent Problem Solving: Slow processing;Difficulty sequencing;Requires verbal cues General Comments: following commands with increased time. Difficulty sequencing noted with oral care. Pt experiencing urinary incontinence  and able to recognize, but unable to plan to go to toilet. Pt with decreased awareness of deficits and safety. Pt highly distractable by conversation going on in room with her adult children present.   General Comments  Pt daughter and son present during session; willing to assist.    Exercises     Shoulder Instructions      Home Living Family/patient expects to be discharged to:: Inpatient rehab Living Arrangements: Alone Available Help at Discharge: Family Type of Home: House Home Access: Stairs to enter Technical brewer of Steps: 2   Home Layout: Two level Alternate Level Stairs-Number of Steps: flight Alternate Level Stairs-Rails: Left Bathroom Shower/Tub: Teacher, early years/pre: Standard         Additional Comments: Pt had plans to move to ILF at [facility]. Pt and family reporting willingness and desire to change plans to go to CIR first      Prior Functioning/Environment Level of Independence: Independent        Comments: Daughter recently living with patient. Plan was for patient to move into ILF at [place] soon. Hx of dementia. Daughter reports STM deficits are worsening        OT Problem List: Decreased strength;Decreased range of motion;Decreased activity tolerance;Impaired balance (sitting and/or standing);Decreased cognition;Decreased coordination;Decreased safety awareness      OT Treatment/Interventions: Self-care/ADL training;Therapeutic exercise;DME and/or AE instruction;Therapeutic activities;Cognitive remediation/compensation;Patient/family education    OT Goals(Current goals can be found in the care plan section) Acute Rehab OT Goals Patient Stated Goal: to get better and do for myself OT Goal Formulation: With patient/family Time For Goal Achievement: 10/24/20 Potential to Achieve Goals: Good ADL Goals Pt Will Perform Grooming: with min guard assist;standing Pt Will Perform Lower Body Dressing: with min guard assist;sit  to/from stand Pt Will Transfer  to Toilet: with min guard assist;ambulating;regular height toilet Pt Will Perform Toileting - Clothing Manipulation and hygiene: with min guard assist;sit to/from stand Additional ADL Goal #1: Pt will follow three step command during ADL tasks.  OT Frequency: Min 3X/week   Barriers to D/C:            Co-evaluation              AM-PAC OT "6 Clicks" Daily Activity     Outcome Measure Help from another person eating meals?: A Little Help from another person taking care of personal grooming?: A Lot Help from another person toileting, which includes using toliet, bedpan, or urinal?: A Lot Help from another person bathing (including washing, rinsing, drying)?: A Lot Help from another person to put on and taking off regular upper body clothing?: A Little Help from another person to put on and taking off regular lower body clothing?: A Lot 6 Click Score: 14   End of Session Equipment Utilized During Treatment: Gait belt Nurse Communication: Mobility status  Activity Tolerance: Patient tolerated treatment well Patient left: in bed;with call bell/phone within reach;with bed alarm set;with family/visitor present  OT Visit Diagnosis: Unsteadiness on feet (R26.81);Muscle weakness (generalized) (M62.81);Ataxia, unspecified (R27.0);Other symptoms and signs involving cognitive function;Hemiplegia and hemiparesis Hemiplegia - Right/Left: Left Hemiplegia - dominant/non-dominant: Non-Dominant Hemiplegia - caused by: Cerebral infarction                Time: 1256-1336 OT Time Calculation (min): 40 min Charges:  OT General Charges $OT Visit: 1 Visit OT Evaluation $OT Eval Moderate Complexity: 1 Mod OT Treatments $Self Care/Home Management : 23-37 mins  Shanda Howells, OTDS   Shanda Howells 10/24/2020, 3:48 PM

## 2020-10-24 NOTE — Evaluation (Signed)
Physical Therapy Evaluation Patient Details Name: Pamela Mason MRN: [id number] DOB: Mar 10, 1946 Today's Date: 10/24/2020   History of Present Illness  75 y/o female presented to ED on 6/16 for L sided weakness and dizziness with resolution of symptoms en route to hospital. CT head (-) for acute infarct or acute hemorrhage. Code stroke called in ED for new onset L sided weakness, L limb ataxia, and L neglect when preparing for d/c. CTH (-) for acute infact, but showed chronic R MCA infarct. MRI found small areas of acute infarct in R superior insula and corona radiata and small acute infarct in R occipital lobe. PMH: hx of dementia, HTN, HLD, sleep apnea  Clinical Impression  PTA, daughter began living with patient and plan was for patient to move into ILF in upcoming weeks. Patient has hx of dementia which daughter reports STM deficits are worsening. Patient A&Ox4 but decreased awareness of deficits prior to mobility. Patient currently presents with ataxia, L LE functional weakness, impaired balance, decreased activity tolerance, impaired cognition, and impaired functional mobility. Patient required maxA to ambulate 72' with RW. Patient demos L inattention and ataxia during mobility. Cues required for attention to L side to improve awareness of deficits and need for safety. Patient will benefit from skilled PT services during acute stay to address listed deficits. Recommend CIR for intensive therapies at d/c to maximize functional independence and assist with return to PLOF.     Follow Up Recommendations CIR    Equipment Recommendations  Other (comment) (TBD)    Recommendations for Other Services Rehab consult     Precautions / Restrictions Precautions Precautions: Fall Precaution Comments: L inattention, ataxia Restrictions Weight Bearing Restrictions: No      Mobility  Bed Mobility Overal bed mobility: Needs Assistance Bed Mobility: Supine to Sit;Sit to Supine     Supine to  sit: Min guard;HOB elevated Sit to supine: Min guard   General bed mobility comments: min guard for safety, no physical assist required    Transfers Overall transfer level: Needs assistance Equipment used: Rolling Edoardo Laforte (2 wheeled);2 person hand held assist Transfers: Sit to/from Stand Sit to Stand: Min assist         General transfer comment: minA to rise and steady. Assist for placing L hand on RW. Cues for attending to L hand due to ataxia  Ambulation/Gait Ambulation/Gait assistance: Max assist Gait Distance (Feet): 15 Feet Assistive device: Rolling Hamp Moreland (2 wheeled) Gait Pattern/deviations: Decreased step length - left;Decreased stance time - left;Decreased stride length;Decreased weight shift to left;Ataxic;Scissoring Gait velocity: decreased   General Gait Details: Severe L LE ataxia with L inattention. Cues for looking at L LE placement with intermittent follow through. MaxA to maintain standing balance with L knee buckling throughout. Scissoring noted with poor awareness. Stood EOB with HHA (face to face) and took side steps to L towards [facility] with min-modA  Stairs            Wheelchair Mobility    Modified Rankin (Stroke Patients Only) Modified Rankin (Stroke Patients Only) Pre-Morbid Rankin Score: Slight disability Modified Rankin: Moderately severe disability     Balance Overall balance assessment: Needs assistance Sitting-balance support: Feet supported;Single extremity supported Sitting balance-Leahy Scale: Fair Sitting balance - Comments: x1 LOB to R during dynamic reaching tasks requiring minA to recover   Standing balance support: Bilateral upper extremity supported;During functional activity Standing balance-Leahy Scale: Zero Standing balance comment: required maxA to maintain standing during ambulation  Pertinent Vitals/Pain Pain Assessment: No/denies pain    Home Living Family/patient expects to be  discharged to:: Inpatient rehab                      Prior Function Level of Independence: Independent         Comments: Daughter recently living with patient. Plan was for patient to move into ILF at [facility] soon. Hx of dementia. Daughter reports STM deficits are worsening     Hand Dominance   Dominant Hand: Right    Extremity/Trunk Assessment   Upper Extremity Assessment Upper Extremity Assessment: Defer to OT evaluation    Lower Extremity Assessment Lower Extremity Assessment: LLE deficits/detail LLE Deficits / Details: grossly 4+/5 with MMT but demonstrates functional weakness vs inattention/ataxia LLE Sensation: decreased proprioception LLE Coordination: decreased fine motor;decreased gross motor    Cervical / Trunk Assessment Cervical / Trunk Assessment: Other exceptions Cervical / Trunk Exceptions: mild truncal ataxia noted in sitting  Communication   Communication: No difficulties  Cognition Arousal/Alertness: Awake/alert Behavior During Therapy: WFL for tasks assessed/performed Overall Cognitive Status: History of cognitive impairments - at baseline                                 General Comments: Following all commands with increased time. STM deficits at baseline. L inattention noted with mobility. Poor awareness of deficits and safety      General Comments      Exercises     Assessment/Plan    PT Assessment Patient needs continued PT services  PT Problem List Decreased strength;Decreased activity tolerance;Decreased balance;Decreased mobility;Decreased coordination;Decreased cognition;Decreased safety awareness;Decreased knowledge of precautions       PT Treatment Interventions DME instruction;Gait training;Stair training;Functional mobility training;Therapeutic activities;Therapeutic exercise;Balance training;Neuromuscular re-education;Patient/family education    PT Goals (Current goals can be found in the Care Plan  section)  Acute Rehab PT Goals Patient Stated Goal: to get better and do for myself PT Goal Formulation: With patient/family Time For Goal Achievement: 11/07/20 Potential to Achieve Goals: Fair    Frequency Min 4X/week   Barriers to discharge        Co-evaluation               AM-PAC PT "6 Clicks" Mobility  Outcome Measure Help needed turning from your back to your side while in a flat bed without using bedrails?: A Little Help needed moving from lying on your back to sitting on the side of a flat bed without using bedrails?: A Little Help needed moving to and from a bed to a chair (including a wheelchair)?: A Little Help needed standing up from a chair using your arms (e.g., wheelchair or bedside chair)?: A Little Help needed to walk in hospital room?: A Lot Help needed climbing 3-5 steps with a railing? : Total 6 Click Score: 15    End of Session Equipment Utilized During Treatment: Gait belt Activity Tolerance: Patient tolerated treatment well Patient left: in bed;with call bell/phone within reach;with bed alarm set Nurse Communication: Mobility status PT Visit Diagnosis: Unsteadiness on feet (R26.81);Muscle weakness (generalized) (M62.81);Ataxic gait (R26.0);Difficulty in walking, not elsewhere classified (R26.2);Other abnormalities of gait and mobility (R26.89);Other symptoms and signs involving the nervous system (R29.898)    Time: 4132-4401 PT Time Calculation (min) (ACUTE ONLY): 46 min   Charges:   PT Evaluation $PT Eval Moderate Complexity: 1 Mod PT Treatments $Therapeutic Activity: 23-37 mins  Alayzha An A. Gilford Rile PT, DPT Acute Rehabilitation Services Pager [phone] Office [phone]   Linna Hoff 10/24/2020, 1:21 PM

## 2020-10-24 NOTE — Plan of Care (Signed)
  Problem: Education: Goal: Knowledge of General Education information will improve Description: Including pain rating scale, medication(s)/side effects and non-pharmacologic comfort measures Outcome: Progressing   Problem: Health Behavior/Discharge Planning: Goal: Ability to manage health-related needs will improve Outcome: Progressing   Problem: Clinical Measurements: Goal: Cardiovascular complication will be avoided Outcome: Progressing   Problem: Skin Integrity: Goal: Risk for impaired skin integrity will decrease Outcome: Progressing   Problem: Safety: Goal: Ability to remain free from injury will improve Outcome: Progressing   Problem: Ischemic Stroke/TIA Tissue Perfusion: Goal: Complications of ischemic stroke/TIA will be minimized Outcome: Progressing

## 2020-10-24 NOTE — Progress Notes (Addendum)
Stroke neurology progress note  ATTENDING NOTE: I reviewed above note and agree with the assessment and plan. Pt was seen and examined.   75 year old female with history of hypertension, hyperlipidemia, OSA, MCI admitted for left-sided weakness and ataxia.  Patient initially had an episode of left-sided weakness lasting for 5 minutes, came to ER for evaluation.  CT no acute abnormality but chronic right MCA frontal infarct.  Patient was about to be discharged and then had again left-sided weakness.  Code stroke activated, MRI showed right MCA scattered infarct as well as chronic right MCA frontal infarct.  CTA head and neck no LVO.  Patient not a tPA candidate given mild symptoms.  EF 55 to 60%, bilateral LE venous Doppler no DVT.  LDL 91, UDS negative, A1c pending.  Creatinine 0.89  On exam, daughter at bedside, patient awake alert orientated x3.  No aphasia, fluent language, no dysarthria.  Able to name and repeat, follow simple commands.  No gaze palsy, visual field full, facial symmetrical.  Left upper extremity pronator drift with decreased finger grip, left lower extremity 4/5 proximal and distal.  Sensation symmetrical.  Left upper extremity finger-to-nose ataxic.  Etiology for patient stroke likely embolic, given cortical location and previous chronic right MCA frontal infarct.  Patient daughter and patient also reported heart palpitation and irregular heartbeat.  Discussed with the EP, loop recorder placed.  Recommend aspirin 81 and Plavix 75 for 3 weeks and then Plavix alone.  Continue Lipitor 40.  PT/OT recommend CIR.  For detailed assessment and plan, please refer to above as I have made changes wherever appropriate.   Neurology will sign off. Please call with questions. Pt will follow up with stroke clinic NP at [facility] in about 4 weeks. Thanks for the consult.   Rosalin Hawking, MD PhD Stroke Neurology 10/24/2020 7:32 PM    Interval History: No acute events overnight.  Pertinent  Imaging:  CT Head 16Jun2022 (1332): Atrophy.  Prior infarct involving portions of the right posterior frontal and anterior right temporal lobe.  Localized calcification of a branch of the middle cerebral artery in this area is consistent with prior occlusion of this portion of the vessel.  Elsewhere there is patchy periventricular small vessel disease.  No evident acute infarct. No mass or acute hemorrhage.  ASPECTS 10  CT Angio head/neck 16Jun2022 (1720) Negative for intracranial large vessel occlusion No significant carotid or vertebral artery stenosis. 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 Recommend annual imaging followup by CTA or MRA.  MR Brain [id number]): Small areas of acute infarct in the right superior insula and corona radiata. Small acute infarct right occipital lobe Chronic infarct right frontal lobe. Mild chronic microvascular ischemia 5 mm nodule left frontal horn possible  heterotopic gray matter.  MR Brain [phone]: Mild-to-moderate cerebral atrophy. No acute intracranial abnormality.  Current vital signs: BP 126/74 (BP Location: Right Arm)   Pulse 60   Temp 98.2 F (36.8 C) (Oral)   Resp 18   Ht 5\' 4"  (1.626 m)   Wt 54.1 kg   SpO2 95%   BMI 20.47 kg/m   Vital signs in last 24 hours: Temp:  [97.4 F (36.3 C)-99 F (37.2 C)] 98.2 F (36.8 C) (06/17 1108) Pulse Rate:  [49-72] 60 (06/17 1108) Resp:  [9-21] 18 (06/17 1108) BP: (110-163)/(62-95) 126/74 (06/17 1108) SpO2:  [86 %-100 %] 95 % (06/17 1108)  ROS:  History obtained from patient, daughter (bedside), chart.  General ROS: Resting comfortably in bed Psychological ROS: Known history of dementia Ophthalmic ROS: Endorses dizziness during morning event on 16Jun2022, has since resolved Respiratory ROS: negative for cough,  shortness  of breath or wheezing Cardiovascular ROS: negative for chest pain Gastrointestinal ROS: negative for NVD Musculoskeletal ROS: As below Neurological ROS: Currently at baseline with the exception of dementia. Daughter feels that he left side is still significantly weak, although this was not detected on exam (below). Ms. Mausolf feels that her left hand is "numb", daughter notes that this began this morning.  General exam HEENT-  Normocephalic, no lesions, without obvious abnormality.  Normal external eye and conjunctiva.   Cardiovascular- S1-S2 audible, pulses palpable throughout   Lungs-no rhonchi or wheezing noted, no excessive working breathing.  Saturations within normal limits Abdomen- All 4 quadrants palpated and nontender Extremities- Warm, dry and intact Musculoskeletal-no joint tenderness, deformity or swelling Skin-warm and dry  Neurologic Exam: Mental Status: Alert, oriented to self, location, year. Ms. Brazzel can not recall the events of the previous 24-36 hours although her daughter notes that short-term memory is particularly challenging for her. Thought content appropriate, minimally delayed.  Speech fluent without evidence of aphasia.   Cranial Nerves: II:  Visual fields grossly normal, pupils equal, round, reactive to light III,IV, VI: ptosis not present, extra-ocular motions intact bilaterally V,VII: smile symmetric, facial light touch sensation normal bilaterally VIII: hearing normal bilaterally IX,X: uvula rises symmetrically XI: bilateral shoulder shrug 5/5 XII: midline tongue extension without atrophy or fasciculations  Motor: RUE and LUE: 5/5 RLE: 5/5, LLE: 4/5 Tone and bulk normal, no atrophy noted Sensory: Pinprick and light touch intact throughout, reduced on the left ulnar aspect of the palm as well as in the ulnar and radial nerve distribution on the dorsal aspect of the hand compared to the right; vibratory sense felt at the mid-shin bilaterally Deep  Tendon Reflexes: Brisk throughout Plantars: Downgoing Gait: Deferred  Lab Results: Basic Metabolic Panel: Recent Labs  Lab 10/23/20 1133  NA 139  K 4.6  CL 107  CO2 23  GLUCOSE 100*  BUN 18  CREATININE 0.89  CALCIUM 8.9   CBC: Recent Labs  Lab 10/23/20 1133  WBC 6.4  NEUTROABS 4.8  HGB 12.6  HCT 39.1  MCV 100.3*  PLT 224    Lipid Panel: Recent Labs  Lab 10/24/20 0423  CHOL 176  TRIG 87  HDL 68  CHOLHDL 2.6  VLDL 17  LDLCALC 91    Assessment/Plan:  Ms. Gaultney is a 75 year old right-handed, fully ambulatory female with a past medical history significant for stroke, dementia, HLD, HTN, depression who presented to [facility] via EMS on 16Jun2022 with reports of transient left-sided weakness. CTH negative at that time, dx TIA. When Ms. Thebeau was preparing to leave the hospital she experienced another episode of left-sided weakness (NIHSS 4).   # CVA On exam this morning, Ms Scarola showed full strength in the upper extremities with reduced sensation in the left hand and reduced strength in the left leg. CTH and CTA negative for LVO; MR brain showed small areas of acute infarct in the right superior insula and corona radiata and small acute infarct right occipital lobe which could be the cause of these symptoms.  - Continue stroke workup   - PT/OT/SLT consults pending   - TTE - completed, results pending   - LE [place] (DVT)   - Loop recorder - Continue ASA 325mg  QD  and plavix 75mg  QD for 21 days - Increase home atorvastatin 80mg  QD - Permissive hypertension (up to 220/120)  # Dementia  - Continue home [place]  # HLD  - LDL 91  - Increase home atorvastatin to 80mg  QD  # HTN  - Permissive hypertension (up to 220/120)  - Hold home medications  # Depression  - Continue home Zoloft  #Dispo  - Pending workup  Hospital day [place], PhD, PA-C [facility] [phone]  10/24/2020, 11:42 AM

## 2020-10-24 NOTE — Progress Notes (Signed)
Inpatient Rehab Admissions Coordinator Note:   Per therapy recommendations, pt was screened for CIR candidacy by Shann Medal, PT, DPT.  At this time we are recommending a CIR consult and I will place an order per our protocol.  Please contact me with questions.   Shann Medal, PT, DPT [phone] 10/24/20 1:47 PM

## 2020-10-25 DIAGNOSIS — E7800 Pure hypercholesterolemia, unspecified: Secondary | ICD-10-CM

## 2020-10-25 LAB — HEMOGLOBIN A1C
Hgb A1c MFr Bld: 5.4 % (ref 4.8–5.6)
Mean Plasma Glucose: 108 mg/dL

## 2020-10-25 NOTE — Progress Notes (Signed)
[facility] PROGRESS NOTE    Progress Note  Pamela Mason  OEV:[id number] DOB: 08-28-1945 DOA: 10/23/2020 PCP: Crist Infante, MD     Brief Narrative:   Pamela Mason is an 75 y.o. female past medical history significant for dementia, essential hypertension sleep apnea comes into the ED for evaluation of acute left-sided weakness which should improve, she is not a candidate for tPA initial CT performed showed old strokes, she is prepared to be discharged home when she had recurrent left-sided weakness with flaccid paralysis of the left lower extremity and discoordination, neurology was consulted recommended a CTA that was negative for intracranial large vessel occlusion, was started on antiplatelet therapy  Awaiting inpatient rehab placement. Assessment/Plan:   Acute cerebrovascular accident (CVA) [facility]): HgbA1c 5.4, fasting lipid panel HDL greater than 40 LDL 91 MRI, showed small area of acute infarct in the right superior insula and corona radiata and  small infarcts in the right occipital lobe PT evaluated the patient recommended inpatient rehab. Transthoracic Echo showed an EF of 55% no wall motion abnormality ventricular cavity was normal. Start patient on ASA 81mg  daily and plavix 75mg  daily  Continue on statins. BP goal: permissive HTN upto 220/120 mmHg No events telemetry monitoring Neurology on board appriciate assistance.  Recommended loop recorder EP was consulted. Lower extremity Doppler was negative for DVT. Loop recorder inserted on 10/24/2020.  HLD (hyperlipidemia) Continue Lipitor.  HTN (hypertension) Allow permissive hypertension.  Advanced dementia: Chronic and stable continue galantamine.   DVT prophylaxis: lovenox Family Communication:daughter Status is: Observation  The patient will require care spanning > 2 midnights and should be moved to inpatient because: Hemodynamically unstable  Dispo: The patient is from: Home               Anticipated d/c is to: SNF              Patient currently is not medically stable to d/c.   Difficult to place patient No     Code Status:     Code Status Orders  (From admission, onward)           Start     Ordered   10/23/20 2025  Full code  Continuous        10/23/20 2026           Code Status History     Date Active Date Inactive Code Status Order ID Comments User Context   01/02/2019 1442 01/03/2019 1439 Full Code [id number]  Danae Orleans, PA-C Inpatient      Advance Directive Documentation    Flowsheet Row Most Recent Value  Type of Advance Directive Healthcare Power of [place], Living will  Pre-existing out of facility DNR order (yellow form or pink MOST form) --  "MOST" Form in Place? --         IV Access:   Peripheral IV   Procedures and diagnostic studies:   CT HEAD WO CONTRAST  Result Date: 10/23/2020 CLINICAL DATA:  Altered mental status there is not some EXAM: CT HEAD WITHOUT CONTRAST TECHNIQUE: Contiguous axial images were obtained from the base of the skull through the vertex without intravenous contrast. COMPARISON:  November 01, 2016 brain MRI FINDINGS: Brain: There is mild diffuse atrophy. There is a mild degree of ex vacuo phenomenon involving the frontal horn of the right lateral ventricle. There is no appreciable intracranial mass, acute hemorrhage, extra-axial fluid collection, or midline shift. There is evidence of a prior infarct involving portions of the posterior right  frontal and anterior right temporal lobes. Elsewhere there is decreased attenuation in portions of each centrum semiovale. No acute infarct is evident. Vascular: There is calcification in the periphery of a branch of the right middle cerebral artery in area of prior infarct. There is mild carotid siphon calcification. No hyperdense vessel evident. Skull: Bony calvarium appears intact. Sinuses/Orbits: There is mucosal thickening in several ethmoid air cells. Visualized orbits  appear symmetric bilaterally. Other: Mastoid air cells are clear. There is debris in each external auditory canal. IMPRESSION: Atrophy. Prior infarct involving portions of the right posterior frontal and anterior right temporal lobe. Localized calcification of a branch of the middle cerebral artery in this area is consistent with prior occlusion of this portion of the vessel. Elsewhere there is patchy periventricular small vessel disease. No evident acute infarct. No mass or acute hemorrhage. Mucosal thickening noted in several ethmoid air cells. Probable cerumen in each external auditory canal. Electronically Signed   By: Lowella Grip III M.D.   On: 10/23/2020 13:32   MR BRAIN WO CONTRAST  Result Date: 10/23/2020 CLINICAL DATA:  Acute neuro deficit. EXAM: MRI HEAD WITHOUT CONTRAST TECHNIQUE: Multiplanar, multiecho pulse sequences of the brain and surrounding structures were obtained without intravenous contrast. COMPARISON:  CT head 10/23/2020 FINDINGS: Brain: Scattered small areas of restricted diffusion compatible with acute infarct involving right superior insula and centrum semiovale. Small area of acute infarct in the right occipital pole. Moderate atrophy. Chronic infarct in the right frontal lobe. Mild chronic ischemic change in the pons. No intracranial hemorrhage Sub ependymal 5 mm nodule left frontal lobe extending into the left frontal horn. Possible heterotopic gray matter. Vascular: Normal arterial flow voids Skull and upper cervical spine: Negative Sinuses/Orbits: Mild mucosal edema paranasal sinuses. Bilateral cataract extraction Other: None IMPRESSION: Small areas of acute infarct in the right superior insula and corona radiata. Small acute infarct right occipital lobe Chronic infarct right frontal lobe. Mild chronic microvascular ischemia 5 mm nodule left frontal horn possible  heterotopic gray matter. Electronically Signed   By: Franchot Gallo M.D.   On: 10/23/2020 19:37   EP PPM/ICD  IMPLANT  Result Date: 10/24/2020 SURGEON:  Allegra Lai, MD   PREPROCEDURE DIAGNOSIS:  Cryptogenic Stroke   POSTPROCEDURE DIAGNOSIS:  Cryptogenic Stroke    PROCEDURES:  1. Implantable loop recorder implantation   INTRODUCTION:  Pamela Mason is a 75 y.o. female with a history of unexplained stroke who presents today for implantable loop implantation.  The patient has had a cryptogenic stroke.  Despite an extensive workup by neurology, no reversible causes have been identified.  she has worn telemetry during which she did not have arrhythmias.  There is significant concern for possible atrial fibrillation as the cause for the patients stroke.  The patient therefore presents today for implantable loop implantation.   DESCRIPTION OF PROCEDURE:  Informed written consent was obtained, and the patient was brought to the electrophysiology lab in a fasting state.  The patient required no sedation for the procedure today.  Mapping over the patient's chest was performed by the EP lab staff to identify the area where electrograms were most prominent for ILR recording.  This area was found to be the left parasternal region over the 3rd-4th intercostal space. The patients left chest was therefore prepped and draped in the usual sterile fashion by the EP lab staff. The skin overlying the left parasternal region was infiltrated with lidocaine for local analgesia.  A 0.5-cm incision was made over the left parasternal  region over the 3rd intercostal space.  A subcutaneous ILR pocket was fashioned using a combination of sharp and blunt dissection.  A Medtronic Reveal Linq model [place] [id number] implantable loop recorder was then placed into the pocket  R waves were very prominent and measured 0.56mV. EBL<1 ml.  Steri- Strips and a sterile dressing were then applied.  There were no early apparent complications.   CONCLUSIONS:  1. Successful implantation of a Medtronic Reveal LINQ implantable loop recorder for cryptogenic  stroke  2. No early apparent complications.   ECHOCARDIOGRAM COMPLETE  Result Date: 10/24/2020    ECHOCARDIOGRAM REPORT   Patient Name:   Pamela Mason [facility] Date of Exam: 10/24/2020 Medical Rec #:  [id number]         Height:       64.0 in Accession #:    [id number]        Weight:       119.3 lb Date of Birth:  1946-04-02         BSA:          1.570 m Patient Age:    67 years          BP:           126/74 mmHg Patient Gender: F                 HR:           54 bpm. Exam Location:  Inpatient Procedure: 2D Echo, Color Doppler, Cardiac Doppler and Intracardiac            Opacification Agent Indications:    Stroke  History:        Patient has no prior history of Echocardiogram examinations.                 Risk Factors:Hypertension, Sleep Apnea and Dyslipidemia.  Sonographer:    Clayton Lefort RDCS (AE) Referring Phys: [id number] Karsten Fells [facility]  Sonographer Comments: Technically difficult study due to poor echo windows. Limited patient mobility. IMPRESSIONS  1. Technically difficult echo with poor image quality.  2. Left ventricular ejection fraction, by estimation, is 55 to 60%. The left ventricle has normal function. The left ventricle has no regional wall motion abnormalities. There is mild left ventricular hypertrophy. Left ventricular diastolic parameters are indeterminate.  3. Right ventricular systolic function is normal. The right ventricular size is not well visualized.  4. Right atrial size was mildly dilated.  5. The mitral valve was not well visualized. No evidence of mitral valve regurgitation.  6. The aortic valve is tricuspid. Aortic valve regurgitation is not visualized. No aortic stenosis is present. FINDINGS  Left Ventricle: Left ventricular ejection fraction, by estimation, is 55 to 60%. The left ventricle has normal function. The left ventricle has no regional wall motion abnormalities. Definity contrast agent was given IV to delineate the left ventricular  endocardial borders. The left ventricular  internal cavity size was normal in size. There is mild left ventricular hypertrophy. Left ventricular diastolic parameters are indeterminate. Right Ventricle: The right ventricular size is not well visualized. Right vetricular wall thickness was not well visualized. Right ventricular systolic function is normal. Left Atrium: Left atrial size was normal in size. Right Atrium: Right atrial size was mildly dilated. Pericardium: There is no evidence of pericardial effusion. Mitral Valve: The mitral valve was not well visualized. No evidence of mitral valve regurgitation. MV peak gradient, 2.4 mmHg. The mean mitral valve gradient is 1.0 mmHg. Tricuspid Valve: The tricuspid  valve is not well visualized. Tricuspid valve regurgitation is not demonstrated. Aortic Valve: The aortic valve is tricuspid. There is mild to moderate aortic valve annular calcification. Aortic valve regurgitation is not visualized. No aortic stenosis is present. Aortic valve mean gradient measures 3.0 mmHg. Aortic valve peak gradient measures 6.6 mmHg. Aortic valve area, by VTI measures 1.90 cm. Pulmonic Valve: The pulmonic valve was not well visualized. Pulmonic valve regurgitation is not visualized. Aorta: The aortic root and ascending aorta are structurally normal, with no evidence of dilitation. IAS/Shunts: The interatrial septum was not well visualized. Additional Comments: Technically difficult echo with poor image quality.  LEFT VENTRICLE PLAX 2D LVIDd:         3.50 cm  Diastology LVIDs:         2.30 cm  LV e' medial:    4.35 cm/s LV PW:         1.40 cm  LV E/e' medial:  14.1 LV IVS:        1.10 cm  LV e' lateral:   8.59 cm/s LVOT diam:     1.90 cm  LV E/e' lateral: 7.1 LV SV:         47 LV SV Index:   30 LVOT Area:     2.84 cm  RIGHT VENTRICLE             IVC RV S prime:     10.30 cm/s  IVC diam: 1.05 cm TAPSE (M-mode): 2.2 cm LEFT ATRIUM             Index       RIGHT ATRIUM           Index LA diam:        2.70 cm 1.72 cm/m  RA Area:      19.70 cm LA Vol (A2C):   27.9 ml 17.77 ml/m RA Volume:   58.20 ml  37.06 ml/m LA Vol (A4C):   25.8 ml 16.43 ml/m LA Biplane Vol: 28.6 ml 18.21 ml/m  AORTIC VALVE AV Area (Vmax):    1.77 cm AV Area (Vmean):   1.95 cm AV Area (VTI):     1.90 cm AV Vmax:           128.00 cm/s AV Vmean:          82.300 cm/s AV VTI:            0.248 m AV Peak Grad:      6.6 mmHg AV Mean Grad:      3.0 mmHg LVOT Vmax:         80.10 cm/s LVOT Vmean:        56.600 cm/s LVOT VTI:          0.166 m LVOT/AV VTI ratio: 0.67  AORTA Ao Root diam: 3.00 cm Ao Asc diam:  3.10 cm MITRAL VALVE MV Area (PHT): 2.39 cm    SHUNTS MV Area VTI:   1.81 cm    Systemic VTI:  0.17 m MV Peak grad:  2.4 mmHg    Systemic Diam: 1.90 cm MV Mean grad:  1.0 mmHg MV Vmax:       0.77 m/s MV Vmean:      39.1 cm/s MV Decel Time: 317 msec MV E velocity: 61.40 cm/s MV A velocity: 67.10 cm/s MV E/A ratio:  0.92 Mertie Moores MD Electronically signed by Mertie Moores MD Signature Date/Time: 10/24/2020/4:30:37 PM    Final    CT HEAD CODE STROKE WO CONTRAST`  Result Date:  10/23/2020 CLINICAL DATA:  Code stroke.  Left-sided weakness EXAM: CT HEAD WITHOUT CONTRAST TECHNIQUE: Contiguous axial images were obtained from the base of the skull through the vertex without intravenous contrast. COMPARISON:  CT head earlier today 10/23/2020 FINDINGS: Brain: Chronic infarct in the right frontal lobe and right frontal operculum unchanged. There is also involvement of the anterior insula. Mild white matter hypodensities bilaterally consistent with chronic microvascular ischemia Negative for acute infarct, hemorrhage, mass Vascular: Calcification in the right sylvian fissure consistent with right MCA calcification unchanged. Negative for acute hyperdense vessel Skull: Negative Sinuses/Orbits: Mild mucosal edema paranasal sinuses. Bilateral cataract extraction Other: None ASPECTS ([place] Stroke Program Early CT Score) - Ganglionic level infarction (caudate, lentiform nuclei,  internal capsule, insula, M1-M3 cortex): 7 - Supraganglionic infarction (M4-M6 cortex): 3 Total score (0-10 with 10 being normal): 10 IMPRESSION: 1. No acute abnormality and no change from earlier today 2. Chronic right MCA infarct. 3. ASPECTS is 10 4. Code stroke imaging results were communicated on 10/23/2020 at 5:16 pm to provider [place] via text page Electronically Signed   By: Franchot Gallo M.D.   On: 10/23/2020 17:17   VAS [place] LOWER EXTREMITY VENOUS (DVT)  Result Date: 10/24/2020  Lower Venous DVT Study Patient Name:  Pamela Mason [place]  Date of Exam:   10/24/2020 Medical Rec #: [id number]          Accession #:    [id number] Date of Birth: [birth year]          Patient Gender: F Patient Age:   77Y Exam Location:  [facility] Procedure:      VAS [place] LOWER EXTREMITY VENOUS (DVT) Referring Phys: [id number] Rosalin Hawking --------------------------------------------------------------------------------  Indications: Embolic stroke.  Comparison Study: No prior studies. Performing Technologist: Darlin Coco RDMS,RVT  Examination Guidelines: A complete evaluation includes B-mode imaging, spectral Doppler, color Doppler, and power Doppler as needed of all accessible portions of each vessel. Bilateral testing is considered an integral part of a complete examination. Limited examinations for reoccurring indications may be performed as noted. The reflux portion of the exam is performed with the patient in reverse Trendelenburg.  +---------+---------------+---------+-----------+----------+--------------+ RIGHT    CompressibilityPhasicitySpontaneityPropertiesThrombus Aging +---------+---------------+---------+-----------+----------+--------------+ CFV      Full           Yes      Yes                                 +---------+---------------+---------+-----------+----------+--------------+ SFJ      Full                                                         +---------+---------------+---------+-----------+----------+--------------+ FV Prox  Full                                                        +---------+---------------+---------+-----------+----------+--------------+ FV Mid   Full                                                        +---------+---------------+---------+-----------+----------+--------------+  FV DistalFull                                                        +---------+---------------+---------+-----------+----------+--------------+ PFV      Full                                                        +---------+---------------+---------+-----------+----------+--------------+ POP      Full           Yes      Yes                                 +---------+---------------+---------+-----------+----------+--------------+ PTV      Full                                                        +---------+---------------+---------+-----------+----------+--------------+ PERO     Full                                                        +---------+---------------+---------+-----------+----------+--------------+   +---------+---------------+---------+-----------+----------+--------------+ LEFT     CompressibilityPhasicitySpontaneityPropertiesThrombus Aging +---------+---------------+---------+-----------+----------+--------------+ CFV      Full           Yes      Yes                                 +---------+---------------+---------+-----------+----------+--------------+ SFJ      Full                                                        +---------+---------------+---------+-----------+----------+--------------+ FV Prox  Full                                                        +---------+---------------+---------+-----------+----------+--------------+ FV Mid   Full                                                         +---------+---------------+---------+-----------+----------+--------------+ FV DistalFull                                                        +---------+---------------+---------+-----------+----------+--------------+  PFV      Full                                                        +---------+---------------+---------+-----------+----------+--------------+ POP      Full           Yes      Yes                                 +---------+---------------+---------+-----------+----------+--------------+ PTV      Full                                                        +---------+---------------+---------+-----------+----------+--------------+ PERO     Full                                                        +---------+---------------+---------+-----------+----------+--------------+     Summary: RIGHT: - There is no evidence of deep vein thrombosis in the lower extremity.  - A cystic structure is found in the popliteal fossa.  LEFT: - There is no evidence of deep vein thrombosis in the lower extremity.  - A cystic structure is found in the popliteal fossa.  *See table(s) above for measurements and observations. Electronically signed by Harold Barban MD on 10/24/2020 at 10:22:45 PM.    Final    CT ANGIO HEAD NECK W WO CM (CODE STROKE)  Result Date: 10/23/2020 CLINICAL DATA:  Acute neuro deficit.  Rule out stroke EXAM: CT ANGIOGRAPHY HEAD AND NECK TECHNIQUE: Multidetector CT imaging of the head and neck was performed using the standard protocol during bolus administration of intravenous contrast. Multiplanar CT image reconstructions and MIPs were obtained to evaluate the vascular anatomy. Carotid stenosis measurements (when applicable) are obtained utilizing NASCET criteria, using the distal internal carotid diameter as the denominator. CONTRAST:  16mL OMNIPAQUE IOHEXOL 350 MG/ML SOLN COMPARISON:  CT head 10/23/2020 FINDINGS: CTA NECK FINDINGS Aortic arch: Dilated aortic  arch. Proximal aortic arch measures 36 mm in diameter with mild atherosclerotic calcification. Proximal great vessels widely patent. Right carotid system: Mild atherosclerotic disease right carotid bifurcation without stenosis. Tortuous right internal carotid artery without stenosis or dissection. Left carotid system: Mild atherosclerotic disease left carotid bifurcation without stenosis. Tortuosity left internal carotid artery. Vertebral arteries: Both vertebral arteries patent to the skull base without stenosis. Skeleton: Cervical spondylosis.  No acute skeletal abnormality. Other neck: Negative for mass or adenopathy in the neck. Upper chest: Mild subpleural scarring. Review of the MIP images confirms the above findings CTA HEAD FINDINGS Anterior circulation: Cavernous carotid widely patent bilaterally. Anterior and middle cerebral arteries patent bilaterally without stenosis. A[place]gos anterior cerebral artery supplied from the right. Small left anterior cerebral artery. Posterior circulation: Both vertebral arteries patent to the basilar. PICA patent bilaterally. Basilar widely patent. Superior cerebellar and posterior cerebral arteries patent bilaterally without stenosis. Venous sinuses: Normal venous enhancement Anatomic variants: None Review of the MIP images confirms  the above findings IMPRESSION: 1. Negative for intracranial large vessel occlusion 2. No significant carotid or vertebral artery stenosis. 3. 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 Recommend annual imaging followup by CTA or MRA. This recommendation follows 2010 ACCF/AHA/AATS/ACR/ASA/SCA/SCAI/SIR/STS/SVM Guidelines for the Diagnosis and Management of Patients with Thoracic Aortic Disease. Circulation.2010; 121: Y101-B510. Aortic aneurysm NOS (ICD10-I71.9) m Electronically Signed   By: Franchot Gallo M.D.   On: 10/23/2020 17:38     Medical Consultants:   None.   Subjective:    Pamela Mason no  complaints  Objective:    Vitals:   10/24/20 1558 10/24/20 2005 10/24/20 2318 10/25/20 0349  BP: 136/88 125/84 125/86 130/82  Pulse: (!) 55 (!) 55 (!) 51 (!) 55  Resp: 18 18 18 18   Temp: 98.2 F (36.8 C) 98.2 F (36.8 C) 98 F (36.7 C) 98 F (36.7 C)  TempSrc: Oral Oral Oral Oral  SpO2: 96% 97% 95% 94%  Weight:      Height:       SpO2: 94 %   Intake/Output Summary (Last 24 hours) at 10/25/2020 2585 Last data filed at 10/24/2020 1700 Gross per 24 hour  Intake --  Output 900 ml  Net -900 ml    Filed Weights   10/23/20 1121  Weight: 54.1 kg    Exam: General exam: In no acute distress. Respiratory system: Good air movement and clear to auscultation. Cardiovascular system: S1 & S2 heard, RRR. No JVD. Gastrointestinal system: Abdomen is nondistended, soft and nontender.  Extremities: No pedal edema. Skin: No rashes, lesions or ulcers  Data Reviewed:    Labs: Basic Metabolic Panel: Recent Labs  Lab 10/23/20 1133  NA 139  K 4.6  CL 107  CO2 23  GLUCOSE 100*  BUN 18  CREATININE 0.89  CALCIUM 8.9    GFR Estimated Creatinine Clearance: 46.6 mL/min (by C-G formula based on SCr of 0.89 mg/dL). Liver Function Tests: Recent Labs  Lab 10/23/20 1133  AST 28  ALT 21  ALKPHOS 69  BILITOT 1.0  PROT 6.0*  ALBUMIN 3.4*    No results for input(s): LIPASE, AMYLASE in the last 168 hours. No results for input(s): AMMONIA in the last 168 hours. Coagulation profile Recent Labs  Lab 10/23/20 1133  INR 0.9    COVID-19 Labs  No results for input(s): DDIMER, FERRITIN, LDH, CRP in the last 72 hours.  Lab Results  Component Value Date   SARSCOV2NAA NEGATIVE 10/23/2020   SARSCOV2NAA NEGATIVE 01/19/2019   [place] NEGATIVE 12/29/2018    CBC: Recent Labs  Lab 10/23/20 1133  WBC 6.4  NEUTROABS 4.8  HGB 12.6  HCT 39.1  MCV 100.3*  PLT 224    Cardiac Enzymes: No results for input(s): CKTOTAL, CKMB, CKMBINDEX, TROPONINI in the last 168 hours. BNP  (last 3 results) No results for input(s): PROBNP in the last 8760 hours. CBG: Recent Labs  Lab 10/23/20 1713  GLUCAP 84    D-Dimer: No results for input(s): DDIMER in the last 72 hours. Hgb A1c: Recent Labs    10/24/20 0423  HGBA1C 5.4   Lipid Profile: Recent Labs    10/24/20 0423  CHOL 176  HDL 68  LDLCALC 91  TRIG 87  CHOLHDL 2.6    Thyroid function studies: Recent Labs    10/24/20 0423  TSH 1.969    Anemia work up: No results for input(s): VITAMINB12, FOLATE, FERRITIN, TIBC, IRON, RETICCTPCT in the last 72 hours. Sepsis Labs: Recent Labs  Lab 10/23/20 1133  WBC 6.4    Microbiology Recent Results (from the past 240 hour(s))  Resp Panel by RT-PCR (Flu A&B, Covid) Nasopharyngeal Swab     Status: None   Collection Time: 10/23/20  7:25 PM   Specimen: Nasopharyngeal Swab; Nasopharyngeal(NP) swabs in vial transport medium  Result Value Ref Range Status   SARS Coronavirus 2 by RT PCR NEGATIVE NEGATIVE Final    Comment: (NOTE) SARS-CoV-2 target nucleic acids are NOT DETECTED.  The SARS-CoV-2 RNA is generally detectable in upper respiratory specimens during the acute phase of infection. The lowest concentration of SARS-CoV-2 viral copies this assay can detect is 138 copies/mL. A negative result does not preclude SARS-Cov-2 infection and should not be used as the sole basis for treatment or other patient management decisions. A negative result may occur with  improper specimen collection/handling, submission of specimen other than nasopharyngeal swab, presence of viral mutation(s) within the areas targeted by this assay, and inadequate number of viral copies(<138 copies/mL). A negative result must be combined with clinical observations, patient history, and epidemiological information. The expected result is Negative.  Fact Sheet for Patients:  EntrepreneurPulse.com.au  Fact Sheet for Healthcare Providers:   IncredibleEmployment.be  This test is no t yet approved or cleared by the [place] FDA and  has been authorized for detection and/or diagnosis of SARS-CoV-2 by FDA under an Emergency Use Authorization (EUA). This EUA will remain  in effect (meaning this test can be used) for the duration of the COVID-19 declaration under Section 564(b)(1) of the Act, 21 U.S.C.section 360bbb-3(b)(1), unless the authorization is terminated  or revoked sooner.       Influenza A by PCR NEGATIVE NEGATIVE Final   Influenza B by PCR NEGATIVE NEGATIVE Final    Comment: (NOTE) The Xpert Xpress SARS-CoV-2/FLU/RSV plus assay is intended as an aid in the diagnosis of influenza from Nasopharyngeal swab specimens and should not be used as a sole basis for treatment. Nasal washings and aspirates are unacceptable for Xpert Xpress SARS-CoV-2/FLU/RSV testing.  Fact Sheet for Patients: EntrepreneurPulse.com.au  Fact Sheet for Healthcare Providers: IncredibleEmployment.be  This test is not yet approved or cleared by the [place] FDA and has been authorized for detection and/or diagnosis of SARS-CoV-2 by FDA under an Emergency Use Authorization (EUA). This EUA will remain in effect (meaning this test can be used) for the duration of the COVID-19 declaration under Section 564(b)(1) of the Act, 21 U.S.C. section 360bbb-3(b)(1), unless the authorization is terminated or revoked.  Performed at [facility], [place] [address] [place], [place] [address]      Medications:    aspirin EC  81 mg Oral Daily   atorvastatin  80 mg Oral Daily   clopidogrel  75 mg Oral Daily   enoxaparin (LOVENOX) injection  40 mg Subcutaneous Q24H   galantamine  8 mg Oral Q lunch   sertraline  150 mg Oral Daily   Continuous Infusions:      LOS: 1 day   Charlynne Cousins  [facility]  10/25/2020, 7:22 AM

## 2020-10-25 NOTE — Progress Notes (Signed)
Physical Therapy Treatment Patient Details Name: Pamela Mason MRN: [id number] DOB: 12-23-1945 Today's Date: 10/25/2020    History of Present Illness 75 y/o female presented to ED on 6/16 for L sided weakness and dizziness with resolution of symptoms en route to hospital. CT head (-) for acute infarct or acute hemorrhage. Code stroke called in ED for new onset L sided weakness, L limb ataxia, and L neglect when preparing for d/c. CTH (-) for acute infact, but showed chronic R MCA infarct. MRI found small areas of acute infarct in R superior insula and corona radiata and small acute infarct in R occipital lobe. PMH: hx of dementia, HTN, HLD, sleep apnea    PT Comments    Pt progressing steadily towards her physical therapy goals. Session focused on therapeutic exercises for strengthening/coordination, transfer, pre gait and gait training. Pt requiring min-mod assist for functional mobility. Ambulating 5 ft, then an additional 5 ft with no assistive device. Continues to demonstrate left sided weakness, ataxia/dysmetria, and decreased balance. Encouraged increased use of LUE with functional activities to promote neuro recovery. Continue to recommend comprehensive inpatient rehab (CIR) for post-acute therapy needs.    Follow Up Recommendations  CIR     Equipment Recommendations  3in1 (PT);Wheelchair (measurements PT);Wheelchair cushion (measurements PT)    Recommendations for Other Services Rehab consult     Precautions / Restrictions Precautions Precautions: Fall Precaution Comments: ataxia Restrictions Weight Bearing Restrictions: No    Mobility  Bed Mobility Overal bed mobility: Needs Assistance Bed Mobility: Sit to Supine;Rolling;Sidelying to Sit Rolling: Min guard Sidelying to sit: Min assist   Sit to supine: Min guard   General bed mobility comments: Cues for rolling towards left side, HOB flat, light minA for trunk assist to upright. Min guard for return to supine.  Increased time/effort    Transfers Overall transfer level: Needs assistance Equipment used: None Transfers: Sit to/from Stand Sit to Stand: Min assist;Mod assist         General transfer comment: Min-modA to rise and steady from edge of bed and chair, cues for foot placement, left knee block provided  Ambulation/Gait Ambulation/Gait assistance: Mod assist Gait Distance (Feet): 10 Feet (5", 5") Assistive device: None Gait Pattern/deviations: Decreased step length - left;Decreased stance time - left;Decreased stride length;Decreased weight shift to left;Ataxic;Narrow base of support Gait velocity: decreased Gait velocity interpretation: <1.31 ft/sec, indicative of household ambulator General Gait Details: Pt ambulating 5 ft, then an additional 5 ft with a seated rest break. Emphasis and multimodal cues on wider BOS, stepping initiation, upright posture, upward gaz,e hip extension, left knee extension. Mild buckle with left knee blocked; able to advance LLE without physical assist   Stairs             Wheelchair Mobility    Modified Rankin (Stroke Patients Only) Modified Rankin (Stroke Patients Only) Pre-Morbid Rankin Score: Slight disability Modified Rankin: Moderately severe disability     Balance Overall balance assessment: Needs assistance Sitting-balance support: Feet supported;No upper extremity supported Sitting balance-Leahy Scale: Fair     Standing balance support: During functional activity;No upper extremity supported Standing balance-Leahy Scale: Poor Standing balance comment: reliant on external support                            Cognition Arousal/Alertness: Awake/alert Behavior During Therapy: WFL for tasks assessed/performed Overall Cognitive Status: History of cognitive impairments - at baseline  General Comments: Following all commands with increased time. STM deficits at baseline.  Decreased awareness of deficits and safety      Exercises General Exercises - Lower Extremity Long Arc Quad: Left;10 reps;Seated (with manual resistance) Heel Slides: Left;Supine;20 reps;Other (comment) (with manual resistance last 5 reps) Other Exercises Other Exercises: Supine: bridging x 10 Other Exercises: Sitting and standing: functional reaching in all planes with LUE    General Comments        Pertinent Vitals/Pain Pain Assessment: No/denies pain    Home Living   Living Arrangements: Alone Available Help at Discharge: Family;Available PRN/intermittently Type of Home: House Home Access: Stairs to enter Entrance Stairs-Rails: Right;Left Home Layout: Two level;1/2 bath on main level        Prior Function            PT Goals (current goals can now be found in the care plan section) Acute Rehab PT Goals Patient Stated Goal: to get better and do for myself PT Goal Formulation: With patient/family Time For Goal Achievement: 11/07/20 Potential to Achieve Goals: Good Progress towards PT goals: Progressing toward goals    Frequency    Min 4X/week      PT Plan Current plan remains appropriate    Co-evaluation              AM-PAC PT "6 Clicks" Mobility   Outcome Measure  Help needed turning from your back to your side while in a flat bed without using bedrails?: A Little Help needed moving from lying on your back to sitting on the side of a flat bed without using bedrails?: A Little Help needed moving to and from a bed to a chair (including a wheelchair)?: A Little Help needed standing up from a chair using your arms (e.g., wheelchair or bedside chair)?: A Lot Help needed to walk in hospital room?: A Lot Help needed climbing 3-5 steps with a railing? : Total 6 Click Score: 14    End of Session Equipment Utilized During Treatment: Gait belt Activity Tolerance: Patient tolerated treatment well Patient left: in bed;with call bell/phone within  reach;with bed alarm set Nurse Communication: Mobility status PT Visit Diagnosis: Unsteadiness on feet (R26.81);Muscle weakness (generalized) (M62.81);Ataxic gait (R26.0);Difficulty in walking, not elsewhere classified (R26.2);Other abnormalities of gait and mobility (R26.89);Other symptoms and signs involving the nervous system (R29.898)     Time: 5883-2549 PT Time Calculation (min) (ACUTE ONLY): 26 min  Charges:  $Gait Training: 8-22 mins $Therapeutic Exercise: 8-22 mins                     Wyona Almas, PT, DPT Acute Rehabilitation Services Pager [phone] Office [phone]    Deno Etienne 10/25/2020, 3:16 PM

## 2020-10-25 NOTE — PMR Pre-admission (Signed)
PMR Admission Coordinator Pre-Admission Assessment  Patient: Pamela Mason is an 75 y.o., female MRN: [id number] DOB: [birth year] Height: 5' 4"  (162.6 cm) Weight: 54.1 kg  Insurance Information HMO:     PPO:      PCP:      IPA:      80/20: yes     OTHER:  PRIMARY: Medicare A & B      Policy#: [id number]      Subscriber: [name] Name:       Phone#:      Fax#:  Pre-Cert#:       Employer:  Benefits:  Phone #: verified eligibiliy via [place] on 10/25/20     Name:  Eff. Date: Part A effective 11/07/09, Part B effective 11/07/13     Deduct: $1,556      Out of Pocket Max: NA      Life Max: NA CIR: 100% with Medicare approval      SNF: 100% days 1-20, 80% days 21-100 Outpatient: 80%     Co-Pay: 20% Home Health: 100%      Co-Pay:  DME: 80%     Co-Pay: 20% Providers: pt's choice SECONDARY: BCBS Supplement      Policy#: [id number]     Phone#: [phone]  Financial Counselor:       Phone#:   The "Data Collection Information Summary" for patients in Inpatient Rehabilitation Facilities with attached "Privacy Act Love Records" was provided and verbally reviewed with: Patient and Family  Emergency Contact Information Contact Information     Name Relation Home Work Pamela Mason   [phone]   Pamela Mason, Pamela Mason [phone]  [phone]   [name] [phone]  [phone]   Angelina Pih [phone]  [phone]       Current Medical History  Patient Admitting Diagnosis: CVA  History of Present Illness: Pt is a 75 year old female with medical hx significant for: mild dementia, essential HTN, sleep apnea, HLD. Pt presented to ED on 10/23/20 d/t acute left- sided weakness and dizziness. Pt was not a candidate for tPA d/t initial CT showing chronic right MCA frontal infarct. Pt was preparing to be discharged home when she had recurrent left-sided weakness, left limb ataxia, and left neglect. Code stroke activated.  CTA was negative for intracranial LVO. MRI revealed small acute infarct in right superior insula and corona radiata and small infarcts in right occipital lobe. Echo showed EF of 55-60%.  Pt and pt's Mason reported heart palpitation and irregular heartbeat.  Loop recorder inserted on 10/24/20.  Therapy evaluations completed and CIR recommended d/t pt's deficits in mobility, balance, cognition, and ability to complete ADLs independently.  Complete NIHSS TOTAL: 3  Patient's medical record from [facility] has been reviewed by the rehabilitation admission coordinator and physician.  Past Medical History  Past Medical History:  Diagnosis Date   Anxiety    Arthritis    Complication of anesthesia    slow to wake up   Dementia ([place]) 2018   memory loss   Depression    Hyperlipidemia    Hypertension    Osteopenia    Sleep apnea     Family History   family history includes Heart disease in her father; Hypertension in her mother; Ovarian cancer in her mother.  Prior Rehab/Hospitalizations Has the patient had prior rehab or hospitalizations prior to admission? No  Has the patient had major surgery during 100 days prior to admission? No   Current Medications  Current Facility-Administered  Medications:    acetaminophen (TYLENOL) tablet 650 mg, 650 mg, Oral, Q4H PRN **OR** acetaminophen (TYLENOL) 160 MG/5ML solution 650 mg, 650 mg, Per Tube, Q4H PRN **OR** acetaminophen (TYLENOL) suppository 650 mg, 650 mg, Rectal, Q4H PRN, Lenore Cordia, MD   aspirin EC tablet 81 mg, 81 mg, Oral, Daily, Kirby-Graham, Karsten Fells, NP, 81 mg at 10/26/20 0916   atorvastatin (LIPITOR) tablet 80 mg, 80 mg, Oral, Daily, Adelene Amas, PA-C, 80 mg at 10/26/20 0916   clopidogrel (PLAVIX) tablet 75 mg, 75 mg, Oral, Daily, Kirby-Graham, Karsten Fells, NP, 75 mg at 10/26/20 0916   enoxaparin (LOVENOX) injection 40 mg, 40 mg, Subcutaneous, Q24H, Patel, Vishal R, MD, 40 mg at 10/25/20 2033   galantamine (RAZADYNE ER)  24 hr capsule 8 mg, 8 mg, Oral, Q lunch, Posey Pronto, Roxanne Mins R, MD, 8 mg at 10/25/20 1119   senna-docusate (Senokot-S) tablet 1 tablet, 1 tablet, Oral, QHS PRN, Zada Finders R, MD   sertraline (ZOLOFT) tablet 150 mg, 150 mg, Oral, Daily, Zada Finders R, MD, 150 mg at 10/26/20 0916  Patients Current Diet:  Diet Order             Diet - low sodium heart healthy           Diet regular Room service appropriate? Yes with Assist; Fluid consistency: Thin  Diet effective now                   Precautions / Restrictions Precautions Precautions: Fall Precaution Comments: ataxia Restrictions Weight Bearing Restrictions: No   Has the patient had 2 or more falls or a fall with injury in the past year? No  Prior Activity Level Limited Community (1-2x/wk): leaves house for MD appointments  Prior Functional Level Self Care: Did the patient need help bathing, dressing, using the toilet or eating? Independent  Indoor Mobility: Did the patient need assistance with walking from room to room (with or without device)? Independent  Stairs: Did the patient need assistance with internal or external stairs (with or without device)? Independent  Functional Cognition: Did the patient need help planning regular tasks such as shopping or remembering to take medications? Needed some help  Home Assistive Devices / Amasa Devices/Equipment: None  Prior Device Use: Indicate devices/aids used by the patient prior to current illness, exacerbation or injury? None of the above  Current Functional Level Cognition  Overall Cognitive Status: History of cognitive impairments - at baseline Current Attention Level: Sustained, Selective Orientation Level: Oriented X4 Following Commands: Follows one step commands with increased time Safety/Judgement: Decreased awareness of safety, Decreased awareness of deficits General Comments: Following all commands with increased time. STM deficits at  baseline. Decreased awareness of deficits and safety    Extremity Assessment (includes Sensation/Coordination)  Upper Extremity Assessment: Generalized weakness, LUE deficits/detail LUE Deficits / Details: Pt with decreased ROM in shoulder flexion, and slightly weaker grip strength of L hand (4/5) as compared to R hand (4+/5). Able to perform finger opposition with increased effort. Pt with decreased coordination as seen by undershooting when completing finger-nose test with LUE.  Lower Extremity Assessment: Defer to PT evaluation LLE Deficits / Details: grossly 4+/5 with MMT but demonstrates functional weakness vs inattention/ataxia LLE Sensation: decreased proprioception LLE Coordination: decreased fine motor, decreased gross motor    ADLs  Overall ADL's : Needs assistance/impaired Eating/Feeding: Supervision/ safety, Sitting, Set up Grooming: Oral care, Standing, Moderate assistance Grooming Details (indicate cue type and reason): Pt attempting to apply toothpaste to  toothbrush with lid on. Pt able to self correct. Requiring mod-max A for standing balance. Upper Body Bathing: Minimal assistance, Sitting, Cueing for sequencing, Cueing for safety Lower Body Bathing: Maximal assistance, Sit to/from stand Upper Body Dressing : Minimal assistance, Cueing for sequencing, Sitting Lower Body Dressing: Maximal assistance, Sit to/from stand Lower Body Dressing Details (indicate cue type and reason): Pt donning socks sitting EOB with min guard and intermittent min A, however, Pt requiring max A forbalance during standing tasks. Toilet Transfer: Minimal assistance, BSC, Ambulation Toilet Transfer Details (indicate cue type and reason): Min A to achieve upright posture when standing from [facility] Toileting- Clothing Manipulation and Hygiene: Maximal assistance, Sit to/from stand Functional mobility during ADLs: Maximal assistance General ADL Comments: Pt requiring max A with handheld assist during  functional mobility within room due to decresed balance and ataxia.    Mobility  Overal bed mobility: Needs Assistance Bed Mobility: Sit to Supine, Rolling, Sidelying to Sit Rolling: Min guard Sidelying to sit: Min assist Supine to sit: Min guard Sit to supine: Min guard General bed mobility comments: Cues for rolling towards left side, HOB flat, light minA for trunk assist to upright. Min guard for return to supine. Increased time/effort    Transfers  Overall transfer level: Needs assistance Equipment used: None Transfers: Sit to/from Stand Sit to Stand: Min assist, Mod assist General transfer comment: Min-modA to rise and steady from edge of bed and chair, cues for foot placement, left knee block provided    Ambulation / Gait / Stairs / Wheelchair Mobility  Ambulation/Gait Ambulation/Gait assistance: Mod assist Gait Distance (Feet): 10 Feet (5", 5") Assistive device: None Gait Pattern/deviations: Decreased step length - left, Decreased stance time - left, Decreased stride length, Decreased weight shift to left, Ataxic, Narrow base of support General Gait Details: Pt ambulating 5 ft, then an additional 5 ft with a seated rest break. Emphasis and multimodal cues on wider BOS, stepping initiation, upright posture, upward gaz,e hip extension, left knee extension. Mild buckle with left knee blocked; able to advance LLE without physical assist Gait velocity: decreased Gait velocity interpretation: <1.31 ft/sec, indicative of household ambulator    Posture / Balance Dynamic Sitting Balance Sitting balance - Comments: Pt able to don socks sitting EOB with min guard A- Min A and mod verbal cues for sitting balance Balance Overall balance assessment: Needs assistance Sitting-balance support: Feet supported, No upper extremity supported Sitting balance-Leahy Scale: Fair Sitting balance - Comments: Pt able to don socks sitting EOB with min guard A- Min A and mod verbal cues for sitting  balance Postural control: Posterior lean Standing balance support: During functional activity, No upper extremity supported Standing balance-Leahy Scale: Poor Standing balance comment: reliant on external support    Special needs/care consideration Skin Surgical incision: sternum/Left upper, Bowel and Bladder incontinence, External urinary catheter and Designated visitor Pamela Mason, Mason; Pamela Mason, son ; Pamela Mason, Mason   Previous Home Environment (from acute therapy documentation) Living Arrangements: Alone Available Help at Discharge: Family, Available PRN/intermittently Type of Home: House Home Layout: Two level, 1/2 bath on main level Alternate Level Stairs-Rails: Left Alternate Level Stairs-Number of Steps: 15 Home Access: Stairs to enter Entrance Stairs-Rails: Right, Left Entrance Stairs-Number of Steps: 2 in front, 6-7 in back Bathroom Shower/Tub: Chiropodist: Standard Bathroom Accessibility: Yes How Accessible: Accessible via walker Home Care Services: No Additional Comments: Pt had plans to move to ILF at [facility]. Pt and family reporting willingness and desire to change plans  to go to CIR first  Discharge Living Setting Plans for Discharge Living Setting: Other (Comment) (Plans to move into ILF at Pamela Mason) Type of Home at Discharge: [place] Name at Discharge: Pamela Mason Discharge Home Layout: One level Discharge Home Access: Level entry Discharge Bathroom Accessibility: Yes Does the patient have any problems obtaining your medications?: No  Social/Family/Support Systems Caregiver Availability: 24/7 Discharge Plan Discussed with Primary Caregiver: Yes Is Caregiver In Agreement with Plan?: Yes Does Caregiver/Family have Issues with Lodging/Transportation while Pt is in Rehab?: No  Goals Patient/Family Goal for Rehab: Supervision: PT/OT/ST Expected length of stay: 12-14  days Pt/Family Agrees to Admission and willing to participate: Yes Program Orientation Provided & Reviewed with Pt/Caregiver Including Roles  & Responsibilities: Yes  Decrease burden of Care through IP rehab admission: NA  Possible need for SNF placement upon discharge: Not anticipated  Patient Condition: I have reviewed medical records from Pamela Mason, spoken with CM, and patient and Mason. I met with patient at the bedside for inpatient rehabilitation assessment.  Patient will benefit from ongoing PT, OT, and SLP, can actively participate in 3 hours of therapy a day 5 days of the week, and can make measurable gains during the admission.  Patient will also benefit from the coordinated team approach during an Inpatient Acute Rehabilitation admission.  The patient will receive intensive therapy as well as Rehabilitation physician, nursing, social worker, and care management interventions.  Due to bladder management, bowel management, safety, skin/wound care, disease management, medication administration, and patient education the patient requires 24 hour a day rehabilitation nursing.  The patient is currently Min G-Mod A with mobility and Min-Max A with basic ADLs.  Discharge setting and therapy post discharge at [facility] is anticipated.  Patient has agreed to participate in the Acute Inpatient Rehabilitation Program and will admit today.  Preadmission Screen Completed By:  Bethel Born, 10/26/2020 9:31 AM ______________________________________________________________________   Discussed status with Dr. Letta Pate on 10/26/20  at 9:31 AM and received approval for admission today.  Admission Coordinator:  Bethel Born, CCC-SLP, time 9:31 AM/Date 10/26/20    Assessment/Plan: Diagnosis:RIght subcortical infarcts Does the need for close, 24 hr/day Medical supervision in concert with the patient's rehab needs make it unreasonable for this patient to  be served in a less intensive setting? Yes Co-Morbidities requiring supervision/potential complications: HTN, mild dementia Due to bladder management, bowel management, safety, skin/wound care, disease management, medication administration, pain management, and patient education, does the patient require 24 hr/day rehab nursing? Yes Does the patient require coordinated care of a physician, rehab nurse, PT, OT, and SLP to address physical and functional deficits in the context of the above medical diagnosis(es)? Yes Addressing deficits in the following areas: balance, endurance, locomotion, strength, transferring, bowel/bladder control, bathing, dressing, feeding, grooming, toileting, cognition, and psychosocial support Can the patient actively participate in an intensive therapy program of at least 3 hrs of therapy 5 days a week? Yes The potential for patient to make measurable gains while on inpatient rehab is good Anticipated functional outcomes upon discharge from inpatient rehab: supervision PT, supervision OT, supervision SLP Estimated rehab length of stay to reach the above functional goals is: 12-14d Anticipated discharge destination: Home 10. Overall Rehab/Functional Prognosis: good   MD Signature: Charlett Blake M.D. [facility] Fellow Am Acad of Phys Med and Rehab Diplomate Am Board of Electrodiagnostic Med Fellow Am Board of Interventional Pain

## 2020-10-25 NOTE — Progress Notes (Signed)
Inpatient Rehab Admissions:  Inpatient Rehab Consult received.  I met with patient and family at the bedside for rehabilitation assessment and to discuss goals and expectations of an inpatient rehab admission.  Pt and family acknowledged understanding of CIR goals and expectations. Pt interested in pursuing CIR and family supportive of pt pursuing CIR.  Will continue to follow.  Signed: Gayland Curry, [place], [place] Admissions Coordinator [phone]

## 2020-10-26 ENCOUNTER — Inpatient Hospital Stay (HOSPITAL_COMMUNITY)
Admission: RE | Admit: 2020-10-26 | Discharge: 2020-11-13 | DRG: 057 | Disposition: A | Discharge status: Home Health | Payer: Medicare Other | Source: Ambulatory Visit | Attending: Physical Medicine & Rehabilitation | Admitting: Physical Medicine & Rehabilitation

## 2020-10-26 ENCOUNTER — Other Ambulatory Visit: Payer: Self-pay

## 2020-10-26 ENCOUNTER — Encounter (HOSPITAL_COMMUNITY): Payer: Self-pay | Admitting: Physical Medicine & Rehabilitation

## 2020-10-26 DIAGNOSIS — I69393 Ataxia following cerebral infarction: Secondary | ICD-10-CM

## 2020-10-26 DIAGNOSIS — I69319 Unspecified symptoms and signs involving cognitive functions following cerebral infarction: Secondary | ICD-10-CM | POA: Diagnosis not present

## 2020-10-26 DIAGNOSIS — I639 Cerebral infarction, unspecified: Secondary | ICD-10-CM

## 2020-10-26 DIAGNOSIS — E785 Hyperlipidemia, unspecified: Secondary | ICD-10-CM | POA: Diagnosis present

## 2020-10-26 DIAGNOSIS — M1990 Unspecified osteoarthritis, unspecified site: Secondary | ICD-10-CM | POA: Diagnosis present

## 2020-10-26 DIAGNOSIS — Z7982 Long term (current) use of aspirin: Secondary | ICD-10-CM

## 2020-10-26 DIAGNOSIS — Z79899 Other long term (current) drug therapy: Secondary | ICD-10-CM | POA: Diagnosis not present

## 2020-10-26 DIAGNOSIS — F32A Depression, unspecified: Secondary | ICD-10-CM | POA: Diagnosis present

## 2020-10-26 DIAGNOSIS — I69312 Visuospatial deficit and spatial neglect following cerebral infarction: Secondary | ICD-10-CM | POA: Diagnosis not present

## 2020-10-26 DIAGNOSIS — F0390 Unspecified dementia without behavioral disturbance: Secondary | ICD-10-CM | POA: Diagnosis present

## 2020-10-26 DIAGNOSIS — Z87891 Personal history of nicotine dependence: Secondary | ICD-10-CM

## 2020-10-26 DIAGNOSIS — I10 Essential (primary) hypertension: Secondary | ICD-10-CM | POA: Diagnosis present

## 2020-10-26 DIAGNOSIS — R001 Bradycardia, unspecified: Secondary | ICD-10-CM | POA: Diagnosis not present

## 2020-10-26 DIAGNOSIS — K5900 Constipation, unspecified: Secondary | ICD-10-CM | POA: Diagnosis present

## 2020-10-26 DIAGNOSIS — I69354 Hemiplegia and hemiparesis following cerebral infarction affecting left non-dominant side: Principal | ICD-10-CM

## 2020-10-26 DIAGNOSIS — I2510 Atherosclerotic heart disease of native coronary artery without angina pectoris: Secondary | ICD-10-CM | POA: Diagnosis present

## 2020-10-26 DIAGNOSIS — Z7902 Long term (current) use of antithrombotics/antiplatelets: Secondary | ICD-10-CM

## 2020-10-26 DIAGNOSIS — Z91040 Latex allergy status: Secondary | ICD-10-CM

## 2020-10-26 DIAGNOSIS — Z8249 Family history of ischemic heart disease and other diseases of the circulatory system: Secondary | ICD-10-CM

## 2020-10-26 DIAGNOSIS — Z96642 Presence of left artificial hip joint: Secondary | ICD-10-CM | POA: Diagnosis present

## 2020-10-26 DIAGNOSIS — F419 Anxiety disorder, unspecified: Secondary | ICD-10-CM | POA: Diagnosis present

## 2020-10-26 DIAGNOSIS — E559 Vitamin D deficiency, unspecified: Secondary | ICD-10-CM | POA: Diagnosis present

## 2020-10-26 DIAGNOSIS — G4730 Sleep apnea, unspecified: Secondary | ICD-10-CM | POA: Diagnosis present

## 2020-10-26 DIAGNOSIS — Z91048 Other nonmedicinal substance allergy status: Secondary | ICD-10-CM | POA: Diagnosis not present

## 2020-10-26 LAB — CBC
HCT: 37.3 % (ref 36.0–46.0)
Hemoglobin: 12.6 g/dL (ref 12.0–15.0)
MCH: 32.4 pg (ref 26.0–34.0)
MCHC: 33.8 g/dL (ref 30.0–36.0)
MCV: 95.9 fL (ref 80.0–100.0)
Platelets: 238 10*3/uL (ref 150–400)
RBC: 3.89 MIL/uL (ref 3.87–5.11)
RDW: 11.8 % (ref 11.5–15.5)
WBC: 5.9 10*3/uL (ref 4.0–10.5)
nRBC: 0 % (ref 0.0–0.2)

## 2020-10-26 LAB — CREATININE, SERUM
Creatinine, Ser: 0.82 mg/dL (ref 0.44–1.00)
GFR, Estimated: >60 mL/min (ref >60)

## 2020-10-26 MED ORDER — TRAMADOL HCL 50 MG PO TABS: 50.0000 mg | ORAL_TABLET | Freq: Four times a day (QID) | ORAL | Status: DC | PRN

## 2020-10-26 MED ORDER — SIMVASTATIN 20 MG PO TABS
40.0000 mg | ORAL_TABLET | Freq: Every day | ORAL | Status: DC
  Administered 2020-10-27 – 2020-11-12 (×17): 40 mg via ORAL
  Filled 2020-10-26 (×17): qty 2

## 2020-10-26 MED ORDER — ACETAMINOPHEN 325 MG PO TABS
325.0000 mg | ORAL_TABLET | ORAL | Status: DC | PRN
  Administered 2020-10-27 – 2020-11-13 (×2): 650 mg via ORAL
  Filled 2020-10-26: qty 2

## 2020-10-26 MED ORDER — CLOPIDOGREL BISULFATE 75 MG PO TABS: 75.0000 mg | ORAL_TABLET | Freq: Every day | ORAL | Status: DC

## 2020-10-26 MED ORDER — ASPIRIN EC 81 MG PO TBEC
81.0000 mg | DELAYED_RELEASE_TABLET | Freq: Every day | ORAL | Status: DC
  Administered 2020-10-27 – 2020-11-05 (×10): 81 mg via ORAL
  Filled 2020-10-26 (×10): qty 1

## 2020-10-26 MED ORDER — TRAZODONE HCL 50 MG PO TABS
25.0000 mg | ORAL_TABLET | Freq: Every evening | ORAL | Status: DC | PRN
  Administered 2020-10-27 – 2020-11-03 (×4): 50 mg via ORAL
  Filled 2020-10-26 (×4): qty 1

## 2020-10-26 MED ORDER — VITAMIN B-12 1000 MCG PO TABS
500.0000 ug | ORAL_TABLET | Freq: Every day | ORAL | Status: DC
  Administered 2020-10-27 – 2020-11-13 (×18): 500 ug via ORAL
  Filled 2020-10-26 (×19): qty 1

## 2020-10-26 MED ORDER — GALANTAMINE HYDROBROMIDE ER 8 MG PO CP24
8.0000 mg | ORAL_CAPSULE | Freq: Every day | ORAL | Status: DC
  Administered 2020-10-27 – 2020-11-13 (×18): 8 mg via ORAL
  Filled 2020-10-26 (×18): qty 1

## 2020-10-26 MED ORDER — DOCUSATE SODIUM 100 MG PO CAPS
100.0000 mg | ORAL_CAPSULE | Freq: Every day | ORAL | Status: DC
  Administered 2020-10-26 – 2020-11-12 (×18): 100 mg via ORAL
  Filled 2020-10-26 (×18): qty 1

## 2020-10-26 MED ORDER — LISINOPRIL 10 MG PO TABS
10.0000 mg | ORAL_TABLET | Freq: Every day | ORAL | Status: DC
  Administered 2020-10-27 – 2020-11-10 (×14): 10 mg via ORAL
  Filled 2020-10-26 (×15): qty 1

## 2020-10-26 MED ORDER — POLYETHYLENE GLYCOL 3350 17 G PO PACK: 17.0000 g | PACK | Freq: Every day | ORAL | Status: DC | PRN

## 2020-10-26 MED ORDER — VITAMIN D (ERGOCALCIFEROL) 1.25 MG (50000 UNIT) PO CAPS
50000.0000 [IU] | ORAL_CAPSULE | ORAL | Status: DC
  Administered 2020-10-26 – 2020-11-09 (×3): 50000 [IU] via ORAL
  Filled 2020-10-26 (×3): qty 1

## 2020-10-26 MED ORDER — ENOXAPARIN SODIUM 40 MG/0.4ML IJ SOSY
40.0000 mg | PREFILLED_SYRINGE | INTRAMUSCULAR | Status: DC
  Administered 2020-10-26 – 2020-10-31 (×6): 40 mg via SUBCUTANEOUS
  Filled 2020-10-26 (×6): qty 0.4

## 2020-10-26 MED ORDER — ASPIRIN 81 MG PO TBEC: 81.0000 mg | DELAYED_RELEASE_TABLET | Freq: Every day | ORAL | 11 refills | Status: DC

## 2020-10-26 MED ORDER — CLOPIDOGREL BISULFATE 75 MG PO TABS
75.0000 mg | ORAL_TABLET | Freq: Every day | ORAL | Status: DC
  Administered 2020-10-27: 75 mg via ORAL
  Filled 2020-10-26: qty 1

## 2020-10-26 NOTE — H&P (Signed)
Physical Medicine and Rehabilitation Admission H&P     No chief complaint on file. : HPI: 75 year old female with medical hx significant for: mild dementia, essential HTN, sleep apnea, HLD. Pt presented to ED on 10/23/20 d/t acute left- sided weakness and dizziness. Pt was not a candidate for tPA d/t initial CT showing chronic right MCA frontal infarct. Pt was preparing to be discharged home when she had recurrent left-sided weakness, left limb ataxia, and left neglect. Code stroke activated. CTA was negative for intracranial LVO. MRI revealed small acute infarct in right superior insula and corona radiata and small infarcts in right occipital lobe. Echo showed EF of 55-60%.  Pt and pt's daughter reported heart palpitation and irregular heartbeat.  Loop recorder inserted on 10/24/20.  Therapy evaluations completed and CIR recommended d/t pt's deficits in mobility, balance, cognition, and ability to complete ADLs independently   Review of Systems Constitutional:  Negative for chills and fever. HENT:  Negative for congestion, ear discharge and nosebleeds.   Eyes:  Negative for blurred vision, double vision, discharge and redness. Respiratory:  Negative for cough, shortness of breath, wheezing and stridor.   Cardiovascular:  Negative for chest pain and leg swelling. Gastrointestinal:  Negative for nausea and vomiting. Genitourinary:  Negative for flank pain and hematuria. Musculoskeletal:  Negative for back pain and neck pain. Skin:  Negative for itching. Neurological:  Positive for weakness. Negative for sensory change and headaches. Endo/Heme/Allergies: Negative.   Psychiatric/Behavioral:  Positive for memory loss. Negative for depression.       Past Medical History:  Diagnosis Date   Anxiety     Arthritis     Complication of anesthesia      slow to wake up   Dementia ([place]) 2018    memory loss   Depression     Hyperlipidemia     Hypertension     Osteopenia     Sleep apnea            Past Surgical History:  Procedure Laterality Date   COLONOSCOPY       INTRACAPSULAR CATARACT EXTRACTION       ROTATOR CUFF REPAIR       TOTAL HIP ARTHROPLASTY Left 01/02/2019    Procedure: TOTAL HIP ARTHROPLASTY ANTERIOR APPROACH;  Surgeon: Paralee Cancel, MD;  Location: [facility];  Service: Orthopedics;  Laterality: Left;  70 mins   TUBAL LIGATION             Family History  Problem Relation Age of Onset   Hypertension Mother     Ovarian cancer Mother     Heart disease Father     Colon cancer Neg Hx      Social History:  reports that she has quit smoking. Her smoking use included cigarettes. She has a 1.00 pack-year smoking history. She has never used smokeless tobacco. She reports current alcohol use of about 7.0 standard drinks of alcohol per week. She reports that she does not use drugs. Allergies:       Allergies  Allergen Reactions   Latex        Pt stated she had a reaction to latex in the past. H. Mickley RN   Tape Other (See Comments)      redness          Medications Prior to Admission  Medication Sig Dispense Refill   aspirin EC 81 MG EC tablet Take 1 tablet (81 mg total) by mouth daily. Swallow whole. 30 tablet 11  clopidogrel (PLAVIX) 75 MG tablet Take 1 tablet (75 mg total) by mouth daily.       docusate sodium (COLACE) 100 MG capsule Take 1 capsule (100 mg total) by mouth 2 (two) times daily. (Patient not taking: Reported on 10/23/2020) 28 capsule 0   galantamine (RAZADYNE ER) 8 MG 24 hr capsule Take 8 mg by mouth daily with lunch.       lisinopril (PRINIVIL,ZESTRIL) 10 MG tablet Take 10 mg by mouth every evening.   11   Pediatric Multivitamins-Iron (FLINTSTONES PLUS IRON PO) Take 1 tablet by mouth daily with lunch.       sertraline (ZOLOFT) 100 MG tablet Take 150 mg by mouth daily.       simvastatin (ZOCOR) 40 MG tablet Take 40 mg by mouth every evening.       vitamin B-12 (CYANOCOBALAMIN) 500 MCG tablet Take 500 mcg by mouth daily.       Vitamin D, Ergocalciferol,  (DRISDOL) 1.25 MG (50000 UT) CAPS capsule Take 50,000 Units by mouth every 7 (seven) days. thurs          Drug Regimen Review  Drug regimen was reviewed and remains appropriate with no significant issues identified   Home: Home Living Family/patient expects to be discharged to:: Private residence Living Arrangements: Alone   Functional History:   Functional Status:  Mobility:   ADL:   Cognition: Cognition Orientation Level: Oriented X4   Physical Exam: Blood pressure 117/79, pulse 63, temperature 98.1 F (36.7 C), temperature source Oral, resp. rate 16, height 5\' 4"  (1.626 m), weight 55.8 kg, SpO2 96 %. Physical Exam Constitutional:      Appearance: Normal appearance. HENT:    Head: Normocephalic and atraumatic.    Mouth/Throat:    Mouth: Mucous membranes are moist. Eyes:    Extraocular Movements: Extraocular movements intact.    Conjunctiva/sclera: Conjunctivae normal.    Pupils: Pupils are equal, round, and reactive to light. Cardiovascular:    Rate and Rhythm: Normal rate and regular rhythm.    Heart sounds: Normal heart sounds. No murmur heard. Pulmonary:    Effort: Pulmonary effort is normal.    Breath sounds: Normal breath sounds. No stridor. Abdominal:    General: Abdomen is flat. Bowel sounds are normal. There is no distension.    Palpations: Abdomen is soft. Musculoskeletal:        General: No deformity.    Cervical back: Neck supple.    Right lower leg: No edema.    Left lower leg: No edema. Skin:    General: Skin is warm and dry. Neurological:    Mental Status: She is alert and oriented to person, place, and time.    Motor: Weakness present.    Coordination: Coordination abnormal. Finger-Nose-Finger Test abnormal and Heel to Shin Test abnormal.    Gait: Gait abnormal.    Comments: Motor 4/5 left delt bi, trik grip HF, KE, ADF Left UE ataxia  Psychiatric:        Mood and Affect: Mood normal.        Behavior: Behavior normal.     Lab Results  Last 48 Hours  No results found for this or any previous visit (from the past 48 hour(s)).    Imaging Results (Last 48 hours)  EP PPM/ICD IMPLANT   Result Date: 10/24/2020 SURGEON:  Allegra Lai, MD   PREPROCEDURE DIAGNOSIS:  Cryptogenic Stroke   POSTPROCEDURE DIAGNOSIS:  Cryptogenic Stroke    PROCEDURES:  1. Implantable loop recorder implantation   INTRODUCTION:  Pamela Mason is a 75 y.o. female with a history of unexplained stroke who presents today for implantable loop implantation.  The patient has had a cryptogenic stroke.  Despite an extensive workup by neurology, no reversible causes have been identified.  she has worn telemetry during which she did not have arrhythmias.  There is significant concern for possible atrial fibrillation as the cause for the patients stroke.  The patient therefore presents today for implantable loop implantation.   DESCRIPTION OF PROCEDURE:  Informed written consent was obtained, and the patient was brought to the electrophysiology lab in a fasting state.  The patient required no sedation for the procedure today.  Mapping over the patient's chest was performed by the EP lab staff to identify the area where electrograms were most prominent for ILR recording.  This area was found to be the left parasternal region over the 3rd-4th intercostal space. The patients left chest was therefore prepped and draped in the usual sterile fashion by the EP lab staff. The skin overlying the left parasternal region was infiltrated with lidocaine for local analgesia.  A 0.5-cm incision was made over the left parasternal region over the 3rd intercostal space.  A subcutaneous ILR pocket was fashioned using a combination of sharp and blunt dissection.  A Medtronic Reveal Linq model [place] [id number] implantable loop recorder was then placed into the pocket  R waves were very prominent and measured 0.13mV. EBL<1 ml.  Steri- Strips and a sterile dressing were then applied.  There were no  early apparent complications.   CONCLUSIONS:  1. Successful implantation of a Medtronic Reveal LINQ implantable loop recorder for cryptogenic stroke  2. No early apparent complications.             Medical Problem List and Plan: 1.  Decrease self care and mobility  secondary to Right subcortical infarct             -patient may  shower             -ELOS/Goals: 12-14d sup goals F/u neuro 4 wks post rehab dc 2.  Antithrombotics: -DVT/anticoagulation:  Pharmaceutical: Lovenox             -antiplatelet therapy: ASA 81mg , and clopidigrel75mg  x 3 wk then ASA alone 3. Pain Management: Tylenol prn 4. Mood:Sertraline 100mg  daily             -antipsychotic agents: none 5. Neuropsych: This patient is capable of making decisions on her own behalf would need family assistance for more complex financial and medical decisions. 6. Skin/Wound Care: monitor 7. Fluids/Electrolytes/Nutrition: low sodium HDPD 8.  HTN lisinopril 10mg  9.  Mild dementia Galantamine 8mg  per day 10.  HLD simvastatin 40mg  daily 11.  Vit D deficiency Ergocalciferol 1.25 mg daily 12.  B12 supplemkent 574mcg daily 13. Constipation colace 1 po qd     Charlett Blake, MD 10/26/2020

## 2020-10-26 NOTE — Progress Notes (Signed)
Inpatient Rehab Admissions Coordinator:  There is a bed available for pt in CIR. Dr. Aileen Fass made aware and in agreement. NSG, pt/family, and TOC made aware.    Gayland Curry, [place], [place] Admissions Coordinator [phone]

## 2020-10-26 NOTE — Progress Notes (Signed)
Patient admitted to Rehab this afternoon. No complaints of pain at this time, call light, water, and personal belongings within reach. Angie Fava

## 2020-10-26 NOTE — Progress Notes (Signed)
Inpatient Rehabilitation Medication Review by a Pharmacist  A complete drug regimen review was completed for this patient to identify any potential clinically significant medication issues.  Clinically significant medication issues were identified:  yes   Type of Medication Issue Identified Description of Issue Urgent (address now) Non-Urgent (address on AM team rounds) Plan Plan Accepted by Provider? (Yes / No / Pending AM Rounds)  Drug Interaction(s) (clinically significant)       Duplicate Therapy       Allergy       No Medication Administration End Date  Currently on DAPT x 21 days then ASA alone.  Per Dr. Erlinda Hong note on 6/17, recommendation is for DAPT x 3 weeks, then Plavix only.  Recommend stopping ASA 11/13/20 and continuing Plavix indefinitely.  Address with team in AM     Incorrect Dose       Additional Drug Therapy Needed  Currently on Zocor 40mg  daily with LDL 91.  LDL goal is <70.  Neuro note 6/17 recommended changing to Lipitor 40 for high-intensity statin. Address with team in AM    Other         Name of provider notified for urgent issues identified: n/a  Provider Method of Notification: need to discuss with physician extender in AM, unclear which extender is following patient since admitted over the weekend.   For non-urgent medication issues to be resolved on team rounds tomorrow morning a [facility] Secure Chat Handoff was sent to: [employer]   Pharmacist comments:   Time spent performing this drug regimen review (minutes):  15 minutes   Jaxtyn Linville, [place] 10/26/2020 5:19 PM

## 2020-10-26 NOTE — Discharge Summary (Signed)
Physician Discharge Summary  Pamela Mason VZC:[id number] DOB: 11/25/1945 DOA: 10/23/2020  PCP: Crist Infante, MD  Admit date: 10/23/2020 Discharge date: 10/26/2020  Admitted From: Home Disposition:  CIR  Recommendations for Outpatient Follow-up:  Follow up with neurology in 2 to 4 weeks Please obtain BMP/CBC in one week   Home Health:no Equipment/Devices:None  Discharge Condition:stable CODE STATUS:Full Diet recommendation: Heart Healthy  Brief/Interim Summary: 75 y.o. female past medical history significant for dementia, essential hypertension sleep apnea comes into the ED for evaluation of acute left-sided weakness which should improve, she is not a candidate for tPA initial CT performed showed old strokes, she is prepared to be discharged home when she had recurrent left-sided weakness with flaccid paralysis of the left lower extremity and discoordination, neurology was consulted recommended a CTA that was negative for intracranial large vessel occlusion, was started on antiplatelet therapy  Discharge Diagnoses:  Principal Problem:   Acute cerebrovascular accident (CVA) ([place]) Active Problems:   HLD (hyperlipidemia)   HTN (hypertension)   CVA (cerebral vascular accident) ([place])  Acute cerebrovascular accident (CVA) ([place]): HgbA1c 5.4, fasting lipid panel HDL greater than 40 LDL 91 MRI, showed small area of acute infarct in the right superior insula and corona radiata and  small infarcts in the right occipital lobe Transthoracic Echo showed an EF of 55% no wall motion abnormality ventricular cavity was normal. Neurology was consulted recommended to continue on aspirin and Plavix and recommended a loop recorder. EP was consulted and loop recorder was placed on 10/24/2020. She was continued on statin. Lower extremity Doppler was negative for DVT. Physical therapy evaluated the patient and recommended inpatient rehab.  HLD (hyperlipidemia) Continue Lipitor.   HTN  (hypertension) Allow permissive hypertension.  Dementia without behavioral disturbances: Chronic and stable continue galantamine.    Discharge Instructions  Discharge Instructions     Ambulatory referral to Neurology   Complete by: As directed    Follow up with stroke clinic NP (Jessica Vanschaick or Cecille Rubin, if both not available, consider Zachery Dauer, or Ahern) at [facility] in about 4 weeks. Thanks.   Diet - low sodium heart healthy   Complete by: As directed    Increase activity slowly   Complete by: As directed    No wound care   Complete by: As directed       Allergies as of 10/26/2020       Reactions   Latex    Pt stated she had a reaction to latex in the past. H. Mickley RN   Tape Other (See Comments)   redness        Medication List     TAKE these medications    aspirin 81 MG EC tablet Take 1 tablet (81 mg total) by mouth daily. Swallow whole.   clopidogrel 75 MG tablet Commonly known as: PLAVIX Take 1 tablet (75 mg total) by mouth daily.   docusate sodium 100 MG capsule Commonly known as: Colace Take 1 capsule (100 mg total) by mouth 2 (two) times daily.   FLINTSTONES PLUS IRON PO Take 1 tablet by mouth daily with lunch.   galantamine 8 MG 24 hr capsule Commonly known as: RAZADYNE ER Take 8 mg by mouth daily with lunch.   lisinopril 10 MG tablet Commonly known as: ZESTRIL Take 10 mg by mouth every evening.   sertraline 100 MG tablet Commonly known as: ZOLOFT Take 150 mg by mouth daily.   simvastatin 40 MG tablet Commonly known as: ZOCOR Take 40 mg by  mouth every evening.   vitamin B-12 500 MCG tablet Commonly known as: CYANOCOBALAMIN Take 500 mcg by mouth daily.   Vitamin D (Ergocalciferol) 1.25 MG (50000 UNIT) Caps capsule Commonly known as: DRISDOL Take 50,000 Units by mouth every 7 (seven) days. thurs        Follow-up Information     Crist Infante, MD. Schedule an appointment as soon as possible for a visit .    Specialty: Internal Medicine Contact information: [place] [address] [phone]         [place] [facility] Follow up.   Specialty: Cardiology Why: 11/04/20 @ 4:00PM, wound check visit (heart monitor) Contact information: [address] [place] [phone]        [facility] Follow up in 1 month(s).   Specialty: Neurology Why: stroke clinic Contact information: [place] [phone]               Allergies  Allergen Reactions   Latex     Pt stated she had a reaction to latex in the past. [name] RN   Tape Other (See Comments)    redness    Consultations: Neurology Electrophysiology   Procedures/Studies: CT HEAD WO CONTRAST  Result Date: 10/23/2020 CLINICAL DATA:  Altered mental status there is not some EXAM: CT HEAD WITHOUT CONTRAST TECHNIQUE: Contiguous axial images were obtained from the base of the skull through the vertex without intravenous contrast. COMPARISON:  November 01, 2016 brain MRI FINDINGS: Brain: There is mild diffuse atrophy. There is a mild degree of ex vacuo phenomenon involving the frontal horn of the right lateral ventricle. There is no appreciable intracranial mass, acute hemorrhage, extra-axial fluid collection, or midline shift. There is evidence of a prior infarct involving portions of the posterior right frontal and anterior right temporal lobes. Elsewhere there is decreased attenuation in portions of each centrum semiovale. No acute infarct is evident. Vascular: There is calcification in the periphery of a branch of the right middle cerebral artery in area of prior infarct. There is mild carotid siphon calcification. No hyperdense vessel evident. Skull: Bony calvarium appears intact. Sinuses/Orbits: There is mucosal thickening in several ethmoid air cells. Visualized orbits appear symmetric  bilaterally. Other: Mastoid air cells are clear. There is debris in each external auditory canal. IMPRESSION: Atrophy. Prior infarct involving portions of the right posterior frontal and anterior right temporal lobe. Localized calcification of a branch of the middle cerebral artery in this area is consistent with prior occlusion of this portion of the vessel. Elsewhere there is patchy periventricular small vessel disease. No evident acute infarct. No mass or acute hemorrhage. Mucosal thickening noted in several ethmoid air cells. Probable cerumen in each external auditory canal. Electronically Signed   By: Lowella Grip III M.D.   On: 10/23/2020 13:32   MR BRAIN WO CONTRAST  Result Date: 10/23/2020 CLINICAL DATA:  Acute neuro deficit. EXAM: MRI HEAD WITHOUT CONTRAST TECHNIQUE: Multiplanar, multiecho pulse sequences of the brain and surrounding structures were obtained without intravenous contrast. COMPARISON:  CT head 10/23/2020 FINDINGS: Brain: Scattered small areas of restricted diffusion compatible with acute infarct involving right superior insula and centrum semiovale. Small area of acute infarct in the right occipital pole. Moderate atrophy. Chronic infarct in the right frontal lobe. Mild chronic ischemic change in the pons. No intracranial hemorrhage Sub ependymal 5 mm nodule left frontal lobe extending into the left frontal horn. Possible  heterotopic gray matter. Vascular: Normal arterial flow voids Skull and upper cervical spine: Negative Sinuses/Orbits: Mild mucosal edema paranasal sinuses. Bilateral cataract extraction Other: None IMPRESSION: Small areas of acute infarct in the right superior insula and corona radiata. Small acute infarct right occipital lobe Chronic infarct right frontal lobe. Mild chronic microvascular ischemia 5 mm nodule left frontal horn possible  heterotopic gray matter. Electronically Signed   By: Franchot Gallo M.D.   On: 10/23/2020 19:37   EP PPM/ICD IMPLANT  Result  Date: 10/24/2020 SURGEON:  Allegra Lai, MD   PREPROCEDURE DIAGNOSIS:  Cryptogenic Stroke   POSTPROCEDURE DIAGNOSIS:  Cryptogenic Stroke    PROCEDURES:  1. Implantable loop recorder implantation   INTRODUCTION:  BLUMA BURESH is a 74 y.o. female with a history of unexplained stroke who presents today for implantable loop implantation.  The patient has had a cryptogenic stroke.  Despite an extensive workup by neurology, no reversible causes have been identified.  she has worn telemetry during which she did not have arrhythmias.  There is significant concern for possible atrial fibrillation as the cause for the patients stroke.  The patient therefore presents today for implantable loop implantation.   DESCRIPTION OF PROCEDURE:  Informed written consent was obtained, and the patient was brought to the electrophysiology lab in a fasting state.  The patient required no sedation for the procedure today.  Mapping over the patient's chest was performed by the EP lab staff to identify the area where electrograms were most prominent for ILR recording.  This area was found to be the left parasternal region over the 3rd-4th intercostal space. The patients left chest was therefore prepped and draped in the usual sterile fashion by the EP lab staff. The skin overlying the left parasternal region was infiltrated with lidocaine for local analgesia.  A 0.5-cm incision was made over the left parasternal region over the 3rd intercostal space.  A subcutaneous ILR pocket was fashioned using a combination of sharp and blunt dissection.  A Medtronic Reveal Linq model [place] [id number] implantable loop recorder was then placed into the pocket  R waves were very prominent and measured 0.40mV. EBL<1 ml.  Steri- Strips and a sterile dressing were then applied.  There were no early apparent complications.   CONCLUSIONS:  1. Successful implantation of a Medtronic Reveal LINQ implantable loop recorder for cryptogenic stroke  2. No early  apparent complications.   ECHOCARDIOGRAM COMPLETE  Result Date: 10/24/2020    ECHOCARDIOGRAM REPORT   Patient Name:   KALIOPI BLYDEN [facility] Date of Exam: 10/24/2020 Medical Rec #:  [id number]         Height:       64.0 in Accession #:    [id number]        Weight:       119.3 lb Date of Birth:  06/04/1945         BSA:          1.570 m Patient Age:    71 years          BP:           126/74 mmHg Patient Gender: F                 HR:           54 bpm. Exam Location:  Inpatient Procedure: 2D Echo, Color Doppler, Cardiac Doppler and Intracardiac            Opacification Agent Indications:    Stroke  History:        Patient has no prior history of Echocardiogram examinations.                 Risk Factors:Hypertension, Sleep Apnea and Dyslipidemia.  Sonographer:    Clayton Lefort RDCS (AE) Referring Phys: [id number] [facility]  Sonographer Comments: Technically difficult study due to poor echo windows. Limited patient mobility. IMPRESSIONS  1. Technically difficult echo with poor image quality.  2. Left ventricular ejection fraction, by estimation, is 55 to 60%. The left ventricle has normal function. The left ventricle has no regional wall motion abnormalities. There is mild left ventricular hypertrophy. Left ventricular diastolic parameters are indeterminate.  3. Right ventricular systolic function is normal. The right ventricular size is not well visualized.  4. Right atrial size was mildly dilated.  5. The mitral valve was not well visualized. No evidence of mitral valve regurgitation.  6. The aortic valve is tricuspid. Aortic valve regurgitation is not visualized. No aortic stenosis is present. FINDINGS  Left Ventricle: Left ventricular ejection fraction, by estimation, is 55 to 60%. The left ventricle has normal function. The left ventricle has no regional wall motion abnormalities. Definity contrast agent was given IV to delineate the left ventricular  endocardial borders. The left ventricular internal cavity size was  normal in size. There is mild left ventricular hypertrophy. Left ventricular diastolic parameters are indeterminate. Right Ventricle: The right ventricular size is not well visualized. Right vetricular wall thickness was not well visualized. Right ventricular systolic function is normal. Left Atrium: Left atrial size was normal in size. Right Atrium: Right atrial size was mildly dilated. Pericardium: There is no evidence of pericardial effusion. Mitral Valve: The mitral valve was not well visualized. No evidence of mitral valve regurgitation. MV peak gradient, 2.4 mmHg. The mean mitral valve gradient is 1.0 mmHg. Tricuspid Valve: The tricuspid valve is not well visualized. Tricuspid valve regurgitation is not demonstrated. Aortic Valve: The aortic valve is tricuspid. There is mild to moderate aortic valve annular calcification. Aortic valve regurgitation is not visualized. No aortic stenosis is present. Aortic valve mean gradient measures 3.0 mmHg. Aortic valve peak gradient measures 6.6 mmHg. Aortic valve area, by VTI measures 1.90 cm. Pulmonic Valve: The pulmonic valve was not well visualized. Pulmonic valve regurgitation is not visualized. Aorta: The aortic root and ascending aorta are structurally normal, with no evidence of dilitation. IAS/Shunts: The interatrial septum was not well visualized. Additional Comments: Technically difficult echo with poor image quality.  LEFT VENTRICLE PLAX 2D LVIDd:         3.50 cm  Diastology LVIDs:         2.30 cm  LV e' medial:    4.35 cm/s LV PW:         1.40 cm  LV E/e' medial:  14.1 LV IVS:        1.10 cm  LV e' lateral:   8.59 cm/s LVOT diam:     1.90 cm  LV E/e' lateral: 7.1 LV SV:         47 LV SV Index:   30 LVOT Area:     2.84 cm  RIGHT VENTRICLE             IVC RV S prime:     10.30 cm/s  IVC diam: 1.05 cm TAPSE (M-mode): 2.2 cm LEFT ATRIUM             Index       RIGHT ATRIUM  Index LA diam:        2.70 cm 1.72 cm/m  RA Area:     19.70 cm LA Vol (A2C):    27.9 ml 17.77 ml/m RA Volume:   58.20 ml  37.06 ml/m LA Vol (A4C):   25.8 ml 16.43 ml/m LA Biplane Vol: 28.6 ml 18.21 ml/m  AORTIC VALVE AV Area (Vmax):    1.77 cm AV Area (Vmean):   1.95 cm AV Area (VTI):     1.90 cm AV Vmax:           128.00 cm/s AV Vmean:          82.300 cm/s AV VTI:            0.248 m AV Peak Grad:      6.6 mmHg AV Mean Grad:      3.0 mmHg LVOT Vmax:         80.10 cm/s LVOT Vmean:        56.600 cm/s LVOT VTI:          0.166 m LVOT/AV VTI ratio: 0.67  AORTA Ao Root diam: 3.00 cm Ao Asc diam:  3.10 cm MITRAL VALVE MV Area (PHT): 2.39 cm    SHUNTS MV Area VTI:   1.81 cm    Systemic VTI:  0.17 m MV Peak grad:  2.4 mmHg    Systemic Diam: 1.90 cm MV Mean grad:  1.0 mmHg MV Vmax:       0.77 m/s MV Vmean:      39.1 cm/s MV Decel Time: 317 msec MV E velocity: 61.40 cm/s MV A velocity: 67.10 cm/s MV E/A ratio:  0.92 Mertie Moores MD Electronically signed by Mertie Moores MD Signature Date/Time: 10/24/2020/4:30:37 PM    Final    CT HEAD CODE STROKE WO CONTRAST`  Result Date: 10/23/2020 CLINICAL DATA:  Code stroke.  Left-sided weakness EXAM: CT HEAD WITHOUT CONTRAST TECHNIQUE: Contiguous axial images were obtained from the base of the skull through the vertex without intravenous contrast. COMPARISON:  CT head earlier today 10/23/2020 FINDINGS: Brain: Chronic infarct in the right frontal lobe and right frontal operculum unchanged. There is also involvement of the anterior insula. Mild white matter hypodensities bilaterally consistent with chronic microvascular ischemia Negative for acute infarct, hemorrhage, mass Vascular: Calcification in the right sylvian fissure consistent with right MCA calcification unchanged. Negative for acute hyperdense vessel Skull: Negative Sinuses/Orbits: Mild mucosal edema paranasal sinuses. Bilateral cataract extraction Other: None ASPECTS ([place] Stroke Program Early CT Score) - Ganglionic level infarction (caudate, lentiform nuclei, internal capsule, insula, M1-M3  cortex): 7 - Supraganglionic infarction (M4-M6 cortex): 3 Total score (0-10 with 10 being normal): 10 IMPRESSION: 1. No acute abnormality and no change from earlier today 2. Chronic right MCA infarct. 3. ASPECTS is 10 4. Code stroke imaging results were communicated on 10/23/2020 at 5:16 pm to provider [place] via text page Electronically Signed   By: Franchot Gallo M.D.   On: 10/23/2020 17:17   VAS [place] LOWER EXTREMITY VENOUS (DVT)  Result Date: 10/24/2020  Lower Venous DVT Study Patient Name:  WILDER AMODEI [facility]  Date of Exam:   10/24/2020 Medical Rec #: [id number]          Accession #:    [id number] Date of Birth: [birth year]          Patient Gender: F Patient Age:   45Y Exam Location:  [facility] Procedure:      VAS [place] LOWER EXTREMITY VENOUS (DVT) Referring Phys: [id number] [facility]  XU --------------------------------------------------------------------------------  Indications: Embolic stroke.  Comparison Study: No prior studies. Performing Technologist: Darlin Coco RDMS,RVT  Examination Guidelines: A complete evaluation includes B-mode imaging, spectral Doppler, color Doppler, and power Doppler as needed of all accessible portions of each vessel. Bilateral testing is considered an integral part of a complete examination. Limited examinations for reoccurring indications may be performed as noted. The reflux portion of the exam is performed with the patient in reverse Trendelenburg.  +---------+---------------+---------+-----------+----------+--------------+ RIGHT    CompressibilityPhasicitySpontaneityPropertiesThrombus Aging +---------+---------------+---------+-----------+----------+--------------+ CFV      Full           Yes      Yes                                 +---------+---------------+---------+-----------+----------+--------------+ SFJ      Full                                                        +---------+---------------+---------+-----------+----------+--------------+ FV  Prox  Full                                                        +---------+---------------+---------+-----------+----------+--------------+ FV Mid   Full                                                        +---------+---------------+---------+-----------+----------+--------------+ FV DistalFull                                                        +---------+---------------+---------+-----------+----------+--------------+ PFV      Full                                                        +---------+---------------+---------+-----------+----------+--------------+ POP      Full           Yes      Yes                                 +---------+---------------+---------+-----------+----------+--------------+ PTV      Full                                                        +---------+---------------+---------+-----------+----------+--------------+ PERO     Full                                                        +---------+---------------+---------+-----------+----------+--------------+   +---------+---------------+---------+-----------+----------+--------------+  LEFT     CompressibilityPhasicitySpontaneityPropertiesThrombus Aging +---------+---------------+---------+-----------+----------+--------------+ CFV      Full           Yes      Yes                                 +---------+---------------+---------+-----------+----------+--------------+ SFJ      Full                                                        +---------+---------------+---------+-----------+----------+--------------+ FV Prox  Full                                                        +---------+---------------+---------+-----------+----------+--------------+ FV Mid   Full                                                        +---------+---------------+---------+-----------+----------+--------------+ FV DistalFull                                                         +---------+---------------+---------+-----------+----------+--------------+ PFV      Full                                                        +---------+---------------+---------+-----------+----------+--------------+ POP      Full           Yes      Yes                                 +---------+---------------+---------+-----------+----------+--------------+ PTV      Full                                                        +---------+---------------+---------+-----------+----------+--------------+ PERO     Full                                                        +---------+---------------+---------+-----------+----------+--------------+     Summary: RIGHT: - There is no evidence of deep vein thrombosis in the lower extremity.  - A cystic structure is found in the popliteal fossa.  LEFT: - There is no evidence of deep vein thrombosis in the lower extremity.  -  A cystic structure is found in the popliteal fossa.  *See table(s) above for measurements and observations. Electronically signed by Harold Barban MD on 10/24/2020 at 10:22:45 PM.    Final    CT ANGIO HEAD NECK W WO CM (CODE STROKE)  Result Date: 10/23/2020 CLINICAL DATA:  Acute neuro deficit.  Rule out stroke EXAM: CT ANGIOGRAPHY HEAD AND NECK TECHNIQUE: Multidetector CT imaging of the head and neck was performed using the standard protocol during bolus administration of intravenous contrast. Multiplanar CT image reconstructions and MIPs were obtained to evaluate the vascular anatomy. Carotid stenosis measurements (when applicable) are obtained utilizing NASCET criteria, using the distal internal carotid diameter as the denominator. CONTRAST:  16mL OMNIPAQUE IOHEXOL 350 MG/ML SOLN COMPARISON:  CT head 10/23/2020 FINDINGS: CTA NECK FINDINGS Aortic arch: Dilated aortic arch. Proximal aortic arch measures 36 mm in diameter with mild atherosclerotic calcification. Proximal great vessels widely  patent. Right carotid system: Mild atherosclerotic disease right carotid bifurcation without stenosis. Tortuous right internal carotid artery without stenosis or dissection. Left carotid system: Mild atherosclerotic disease left carotid bifurcation without stenosis. Tortuosity left internal carotid artery. Vertebral arteries: Both vertebral arteries patent to the skull base without stenosis. Skeleton: Cervical spondylosis.  No acute skeletal abnormality. Other neck: Negative for mass or adenopathy in the neck. Upper chest: Mild subpleural scarring. Review of the MIP images confirms the above findings CTA HEAD FINDINGS Anterior circulation: Cavernous carotid widely patent bilaterally. Anterior and middle cerebral arteries patent bilaterally without stenosis. Azygos anterior cerebral artery supplied from the right. Small left anterior cerebral artery. Posterior circulation: Both vertebral arteries patent to the basilar. PICA patent bilaterally. Basilar widely patent. Superior cerebellar and posterior cerebral arteries patent bilaterally without stenosis. Venous sinuses: Normal venous enhancement Anatomic variants: None Review of the MIP images confirms the above findings IMPRESSION: 1. Negative for intracranial large vessel occlusion 2. No significant carotid or vertebral artery stenosis. 3. 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 Recommend annual imaging followup by CTA or MRA. This recommendation follows 2010 ACCF/AHA/AATS/ACR/ASA/SCA/SCAI/SIR/STS/SVM Guidelines for the Diagnosis and Management of Patients with Thoracic Aortic Disease. Circulation.2010; 121: E366-Q947. Aortic aneurysm NOS (ICD10-I71.9) m Electronically Signed   By: Franchot Gallo M.D.   On: 10/23/2020 17:38   (Echo, Carotid, EGD, Colonoscopy, ERCP)    Subjective: No complaints  Discharge Exam: Vitals:   10/26/20 0326 10/26/20 0712  BP: 136/86 112/77  Pulse: 69 62  Resp: 18 16  Temp: 97.8 F (36.6 C) 97.9  F (36.6 C)  SpO2: 96% 97%   Vitals:   10/25/20 1936 10/25/20 2320 10/26/20 0326 10/26/20 0712  BP: 119/75 118/74 136/86 112/77  Pulse: 61 (!) 57 69 62  Resp: 16 18 18 16   Temp: 97.8 F (36.6 C) 98.5 F (36.9 C) 97.8 F (36.6 C) 97.9 F (36.6 C)  TempSrc: Oral Oral Oral Oral  SpO2: 99% 96% 96% 97%  Weight:      Height:        General: Pt is alert, awake, not in acute distress Cardiovascular: RRR, S1/S2 +, no rubs, no gallops Respiratory: CTA bilaterally, no wheezing, no rhonchi Abdominal: Soft, NT, ND, bowel sounds + Extremities: no edema, no cyanosis    The results of significant diagnostics from this hospitalization (including imaging, microbiology, ancillary and laboratory) are listed below for reference.     Microbiology: Recent Results (from the past 240 hour(s))  Resp Panel by RT-PCR (Flu A&B, Covid) Nasopharyngeal Swab     Status: None  Collection Time: 10/23/20  7:25 PM   Specimen: Nasopharyngeal Swab; Nasopharyngeal(NP) swabs in vial transport medium  Result Value Ref Range Status   SARS Coronavirus 2 by RT PCR NEGATIVE NEGATIVE Final    Comment: (NOTE) SARS-CoV-2 target nucleic acids are NOT DETECTED.  The SARS-CoV-2 RNA is generally detectable in upper respiratory specimens during the acute phase of infection. The lowest concentration of SARS-CoV-2 viral copies this assay can detect is 138 copies/mL. A negative result does not preclude SARS-Cov-2 infection and should not be used as the sole basis for treatment or other patient management decisions. A negative result may occur with  improper specimen collection/handling, submission of specimen other than nasopharyngeal swab, presence of viral mutation(s) within the areas targeted by this assay, and inadequate number of viral copies(<138 copies/mL). A negative result must be combined with clinical observations, patient history, and epidemiological information. The expected result is Negative.  Fact  Sheet for Patients:  EntrepreneurPulse.com.au  Fact Sheet for Healthcare Providers:  IncredibleEmployment.be  This test is no t yet approved or cleared by the [place] FDA and  has been authorized for detection and/or diagnosis of SARS-CoV-2 by FDA under an Emergency Use Authorization (EUA). This EUA will remain  in effect (meaning this test can be used) for the duration of the COVID-19 declaration under Section 564(b)(1) of the Act, 21 U.S.C.section 360bbb-3(b)(1), unless the authorization is terminated  or revoked sooner.       Influenza A by PCR NEGATIVE NEGATIVE Final   Influenza B by PCR NEGATIVE NEGATIVE Final    Comment: (NOTE) The Xpert Xpress SARS-CoV-2/FLU/RSV plus assay is intended as an aid in the diagnosis of influenza from Nasopharyngeal swab specimens and should not be used as a sole basis for treatment. Nasal washings and aspirates are unacceptable for Xpert Xpress SARS-CoV-2/FLU/RSV testing.  Fact Sheet for Patients: EntrepreneurPulse.com.au  Fact Sheet for Healthcare Providers: IncredibleEmployment.be  This test is not yet approved or cleared by the [place] FDA and has been authorized for detection and/or diagnosis of SARS-CoV-2 by FDA under an Emergency Use Authorization (EUA). This EUA will remain in effect (meaning this test can be used) for the duration of the COVID-19 declaration under Section 564(b)(1) of the Act, 21 U.S.C. section 360bbb-3(b)(1), unless the authorization is terminated or revoked.  Performed at [facility], [place] [address] [place], [place] [address]      Labs: BNP (last 3 results) No results for input(s): BNP in the last 8760 hours. Basic Metabolic Panel: Recent Labs  Lab 10/23/20 1133  NA 139  K 4.6  CL 107  CO2 23  GLUCOSE 100*  BUN 18  CREATININE 0.89  CALCIUM 8.9   Liver Function Tests: Recent Labs  Lab 10/23/20 1133  AST  28  ALT 21  ALKPHOS 69  BILITOT 1.0  PROT 6.0*  ALBUMIN 3.4*   No results for input(s): LIPASE, AMYLASE in the last 168 hours. No results for input(s): AMMONIA in the last 168 hours. CBC: Recent Labs  Lab 10/23/20 1133  WBC 6.4  NEUTROABS 4.8  HGB 12.6  HCT 39.1  MCV 100.3*  PLT 224   Cardiac Enzymes: No results for input(s): CKTOTAL, CKMB, CKMBINDEX, TROPONINI in the last 168 hours. BNP: Invalid input(s): POCBNP CBG: Recent Labs  Lab 10/23/20 1713  GLUCAP 84   D-Dimer No results for input(s): DDIMER in the last 72 hours. Hgb A1c Recent Labs    10/24/20 0423  HGBA1C 5.4   Lipid Profile Recent Labs  10/24/20 0423  CHOL 176  HDL 68  LDLCALC 91  TRIG 87  CHOLHDL 2.6   Thyroid function studies Recent Labs    10/24/20 0423  TSH 1.969   Anemia work up No results for input(s): VITAMINB12, FOLATE, FERRITIN, TIBC, IRON, RETICCTPCT in the last 72 hours. Urinalysis    Component Value Date/Time   COLORURINE STRAW (A) 10/23/2020 1605   APPEARANCEUR CLEAR 10/23/2020 1605   LABSPEC 1.005 10/23/2020 1605   PHURINE 5.0 10/23/2020 1605   GLUCOSEU NEGATIVE 10/23/2020 1605   HGBUR NEGATIVE 10/23/2020 1605   BILIRUBINUR NEGATIVE 10/23/2020 1605   KETONESUR 5 (A) 10/23/2020 1605   PROTEINUR NEGATIVE 10/23/2020 1605   UROBILINOGEN 0.2 01/15/2014 1631   NITRITE NEGATIVE 10/23/2020 1605   LEUKOCYTESUR TRACE (A) 10/23/2020 1605   Sepsis Labs Invalid input(s): PROCALCITONIN,  WBC,  LACTICIDVEN Microbiology Recent Results (from the past 240 hour(s))  Resp Panel by RT-PCR (Flu A&B, Covid) Nasopharyngeal Swab     Status: None   Collection Time: 10/23/20  7:25 PM   Specimen: Nasopharyngeal Swab; Nasopharyngeal(NP) swabs in vial transport medium  Result Value Ref Range Status   SARS Coronavirus 2 by RT PCR NEGATIVE NEGATIVE Final    Comment: (NOTE) SARS-CoV-2 target nucleic acids are NOT DETECTED.  The SARS-CoV-2 RNA is generally detectable in upper  respiratory specimens during the acute phase of infection. The lowest concentration of SARS-CoV-2 viral copies this assay can detect is 138 copies/mL. A negative result does not preclude SARS-Cov-2 infection and should not be used as the sole basis for treatment or other patient management decisions. A negative result may occur with  improper specimen collection/handling, submission of specimen other than nasopharyngeal swab, presence of viral mutation(s) within the areas targeted by this assay, and inadequate number of viral copies(<138 copies/mL). A negative result must be combined with clinical observations, patient history, and epidemiological information. The expected result is Negative.  Fact Sheet for Patients:  EntrepreneurPulse.com.au  Fact Sheet for Healthcare Providers:  IncredibleEmployment.be  This test is no t yet approved or cleared by the [place] FDA and  has been authorized for detection and/or diagnosis of SARS-CoV-2 by FDA under an Emergency Use Authorization (EUA). This EUA will remain  in effect (meaning this test can be used) for the duration of the COVID-19 declaration under Section 564(b)(1) of the Act, 21 U.S.C.section 360bbb-3(b)(1), unless the authorization is terminated  or revoked sooner.       Influenza A by PCR NEGATIVE NEGATIVE Final   Influenza B by PCR NEGATIVE NEGATIVE Final    Comment: (NOTE) The Xpert Xpress SARS-CoV-2/FLU/RSV plus assay is intended as an aid in the diagnosis of influenza from Nasopharyngeal swab specimens and should not be used as a sole basis for treatment. Nasal washings and aspirates are unacceptable for Xpert Xpress SARS-CoV-2/FLU/RSV testing.  Fact Sheet for Patients: EntrepreneurPulse.com.au  Fact Sheet for Healthcare Providers: IncredibleEmployment.be  This test is not yet approved or cleared by the [place] FDA and has been  authorized for detection and/or diagnosis of SARS-CoV-2 by FDA under an Emergency Use Authorization (EUA). This EUA will remain in effect (meaning this test can be used) for the duration of the COVID-19 declaration under Section 564(b)(1) of the Act, 21 U.S.C. section 360bbb-3(b)(1), unless the authorization is terminated or revoked.  Performed at [facility], [place] [address], [place], [place] [address]      Time coordinating discharge: Over 30 minutes  SIGNED:   Charlynne Cousins, MD  [facility] 10/26/2020, 9:16 AM Pager   If 7PM-7AM, please contact night-coverage [email or web address] Password [email or web address]

## 2020-10-26 NOTE — Progress Notes (Signed)
[facility] PROGRESS NOTE    Progress Note  RAMATOULAYE PACK  KDX:[id number] DOB: Aug 25, 1945 DOA: 10/23/2020 PCP: Crist Infante, MD     Brief Narrative:   MARIJOSE CURINGTON is an 75 y.o. female past medical history significant for dementia, essential hypertension sleep apnea comes into the ED for evaluation of acute left-sided weakness which should improve, she is not a candidate for tPA initial CT performed showed old strokes, she is prepared to be discharged home when she had recurrent left-sided weakness with flaccid paralysis of the left lower extremity and discoordination, neurology was consulted recommended a CTA that was negative for intracranial large vessel occlusion, was started on antiplatelet therapy  Awaiting inpatient rehab placement.  Assessment/Plan:   Acute cerebrovascular accident (CVA) [facility]): Cont  patient on ASA 81mg  daily and plavix 75mg  daily  Continue on statins. No events telemetry monitoring Neurology on board appriciate assistance.  Recommended loop recorder which was placed on 10/25/2018 Follow-up with neurology as an outpatient.  HLD (hyperlipidemia): Continue Lipitor.  HTN (hypertension) Allow permissive hypertension.  Dementia behavioral disturbances: Chronic and stable continue galantamine.   DVT prophylaxis: lovenox Family Communication:daughter Status is: Observation  The patient will require care spanning > 2 midnights and should be moved to inpatient because: Hemodynamically unstable  Dispo: The patient is from: Home              Anticipated d/c is to: SNF              Patient currently is not medically stable to d/c.   Difficult to place patient No     Code Status:     Code Status Orders  (From admission, onward)           Start     Ordered   10/23/20 2025  Full code  Continuous        10/23/20 2026           Code Status History     Date Active Date Inactive Code Status Order ID Comments User Context    01/02/2019 1442 01/03/2019 1439 Full Code [id number]  Danae Orleans, PA-C Inpatient      Advance Directive Documentation    Flowsheet Row Most Recent Value  Type of Advance Directive Healthcare Power of [place], Living will  Pre-existing out of facility DNR order (yellow form or pink MOST form) --  "MOST" Form in Place? --         IV Access:   Peripheral IV   Procedures and diagnostic studies:   EP PPM/ICD IMPLANT  Result Date: 10/24/2020 SURGEON:  Allegra Lai, MD   PREPROCEDURE DIAGNOSIS:  Cryptogenic Stroke   POSTPROCEDURE DIAGNOSIS:  Cryptogenic Stroke    PROCEDURES:  1. Implantable loop recorder implantation   INTRODUCTION:  ZYNIAH FERRAIOLO is a 75 y.o. female with a history of unexplained stroke who presents today for implantable loop implantation.  The patient has had a cryptogenic stroke.  Despite an extensive workup by neurology, no reversible causes have been identified.  she has worn telemetry during which she did not have arrhythmias.  There is significant concern for possible atrial fibrillation as the cause for the patients stroke.  The patient therefore presents today for implantable loop implantation.   DESCRIPTION OF PROCEDURE:  Informed written consent was obtained, and the patient was brought to the electrophysiology lab in a fasting state.  The patient required no sedation for the procedure today.  Mapping over the patient's chest was performed by the EP  lab staff to identify the area where electrograms were most prominent for ILR recording.  This area was found to be the left parasternal region over the 3rd-4th intercostal space. The patients left chest was therefore prepped and draped in the usual sterile fashion by the EP lab staff. The skin overlying the left parasternal region was infiltrated with lidocaine for local analgesia.  A 0.5-cm incision was made over the left parasternal region over the 3rd intercostal space.  A subcutaneous ILR pocket was fashioned using  a combination of sharp and blunt dissection.  A Medtronic Reveal Linq model [place] [id number] implantable loop recorder was then placed into the pocket  R waves were very prominent and measured 0.6mV. EBL<1 ml.  Steri- Strips and a sterile dressing were then applied.  There were no early apparent complications.   CONCLUSIONS:  1. Successful implantation of a Medtronic Reveal LINQ implantable loop recorder for cryptogenic stroke  2. No early apparent complications.   ECHOCARDIOGRAM COMPLETE  Result Date: 10/24/2020    ECHOCARDIOGRAM REPORT   Patient Name:   JAMILLIA CLOSSON [facility] Date of Exam: 10/24/2020 Medical Rec #:  [id number]         Height:       64.0 in Accession #:    [id number]        Weight:       119.3 lb Date of Birth:  10-Feb-1946         BSA:          1.570 m Patient Age:    67 years          BP:           126/74 mmHg Patient Gender: F                 HR:           54 bpm. Exam Location:  Inpatient Procedure: 2D Echo, Color Doppler, Cardiac Doppler and Intracardiac            Opacification Agent Indications:    Stroke  History:        Patient has no prior history of Echocardiogram examinations.                 Risk Factors:Hypertension, Sleep Apnea and Dyslipidemia.  Sonographer:    Clayton Lefort RDCS (AE) Referring Phys: [id number] Karsten Fells [facility]  Sonographer Comments: Technically difficult study due to poor echo windows. Limited patient mobility. IMPRESSIONS  1. Technically difficult echo with poor image quality.  2. Left ventricular ejection fraction, by estimation, is 55 to 60%. The left ventricle has normal function. The left ventricle has no regional wall motion abnormalities. There is mild left ventricular hypertrophy. Left ventricular diastolic parameters are indeterminate.  3. Right ventricular systolic function is normal. The right ventricular size is not well visualized.  4. Right atrial size was mildly dilated.  5. The mitral valve was not well visualized. No evidence of mitral valve  regurgitation.  6. The aortic valve is tricuspid. Aortic valve regurgitation is not visualized. No aortic stenosis is present. FINDINGS  Left Ventricle: Left ventricular ejection fraction, by estimation, is 55 to 60%. The left ventricle has normal function. The left ventricle has no regional wall motion abnormalities. Definity contrast agent was given IV to delineate the left ventricular  endocardial borders. The left ventricular internal cavity size was normal in size. There is mild left ventricular hypertrophy. Left ventricular diastolic parameters are indeterminate. Right Ventricle: The right ventricular size is not  well visualized. Right vetricular wall thickness was not well visualized. Right ventricular systolic function is normal. Left Atrium: Left atrial size was normal in size. Right Atrium: Right atrial size was mildly dilated. Pericardium: There is no evidence of pericardial effusion. Mitral Valve: The mitral valve was not well visualized. No evidence of mitral valve regurgitation. MV peak gradient, 2.4 mmHg. The mean mitral valve gradient is 1.0 mmHg. Tricuspid Valve: The tricuspid valve is not well visualized. Tricuspid valve regurgitation is not demonstrated. Aortic Valve: The aortic valve is tricuspid. There is mild to moderate aortic valve annular calcification. Aortic valve regurgitation is not visualized. No aortic stenosis is present. Aortic valve mean gradient measures 3.0 mmHg. Aortic valve peak gradient measures 6.6 mmHg. Aortic valve area, by VTI measures 1.90 cm. Pulmonic Valve: The pulmonic valve was not well visualized. Pulmonic valve regurgitation is not visualized. Aorta: The aortic root and ascending aorta are structurally normal, with no evidence of dilitation. IAS/Shunts: The interatrial septum was not well visualized. Additional Comments: Technically difficult echo with poor image quality.  LEFT VENTRICLE PLAX 2D LVIDd:         3.50 cm  Diastology LVIDs:         2.30 cm  LV e'  medial:    4.35 cm/s LV PW:         1.40 cm  LV E/e' medial:  14.1 LV IVS:        1.10 cm  LV e' lateral:   8.59 cm/s LVOT diam:     1.90 cm  LV E/e' lateral: 7.1 LV SV:         47 LV SV Index:   30 LVOT Area:     2.84 cm  RIGHT VENTRICLE             IVC RV S prime:     10.30 cm/s  IVC diam: 1.05 cm TAPSE (M-mode): 2.2 cm LEFT ATRIUM             Index       RIGHT ATRIUM           Index LA diam:        2.70 cm 1.72 cm/m  RA Area:     19.70 cm LA Vol (A2C):   27.9 ml 17.77 ml/m RA Volume:   58.20 ml  37.06 ml/m LA Vol (A4C):   25.8 ml 16.43 ml/m LA Biplane Vol: 28.6 ml 18.21 ml/m  AORTIC VALVE AV Area (Vmax):    1.77 cm AV Area (Vmean):   1.95 cm AV Area (VTI):     1.90 cm AV Vmax:           128.00 cm/s AV Vmean:          82.300 cm/s AV VTI:            0.248 m AV Peak Grad:      6.6 mmHg AV Mean Grad:      3.0 mmHg LVOT Vmax:         80.10 cm/s LVOT Vmean:        56.600 cm/s LVOT VTI:          0.166 m LVOT/AV VTI ratio: 0.67  AORTA Ao Root diam: 3.00 cm Ao Asc diam:  3.10 cm MITRAL VALVE MV Area (PHT): 2.39 cm    SHUNTS MV Area VTI:   1.81 cm    Systemic VTI:  0.17 m MV Peak grad:  2.4 mmHg    Systemic Diam: 1.90 cm MV  Mean grad:  1.0 mmHg MV Vmax:       0.77 m/s MV Vmean:      39.1 cm/s MV Decel Time: 317 msec MV E velocity: 61.40 cm/s MV A velocity: 67.10 cm/s MV E/A ratio:  0.92 Mertie Moores MD Electronically signed by Mertie Moores MD Signature Date/Time: 10/24/2020/4:30:37 PM    Final    VAS [place] LOWER EXTREMITY VENOUS (DVT)  Result Date: 10/24/2020  Lower Venous DVT Study Patient Name:  DELIANA AVALOS [facility]  Date of Exam:   10/24/2020 Medical Rec #: [id number]          Accession #:    [id number] Date of Birth: [birth year]          Patient Gender: F Patient Age:   44Y Exam Location:  [facility] Procedure:      VAS [place] LOWER EXTREMITY VENOUS (DVT) Referring Phys: [id number] [place] --------------------------------------------------------------------------------  Indications: Embolic stroke.   Comparison Study: No prior studies. Performing Technologist: Darlin Coco RDMS,RVT  Examination Guidelines: A complete evaluation includes B-mode imaging, spectral Doppler, color Doppler, and power Doppler as needed of all accessible portions of each vessel. Bilateral testing is considered an integral part of a complete examination. Limited examinations for reoccurring indications may be performed as noted. The reflux portion of the exam is performed with the patient in reverse Trendelenburg.  +---------+---------------+---------+-----------+----------+--------------+ RIGHT    CompressibilityPhasicitySpontaneityPropertiesThrombus Aging +---------+---------------+---------+-----------+----------+--------------+ CFV      Full           Yes      Yes                                 +---------+---------------+---------+-----------+----------+--------------+ SFJ      Full                                                        +---------+---------------+---------+-----------+----------+--------------+ FV Prox  Full                                                        +---------+---------------+---------+-----------+----------+--------------+ FV Mid   Full                                                        +---------+---------------+---------+-----------+----------+--------------+ FV DistalFull                                                        +---------+---------------+---------+-----------+----------+--------------+ PFV      Full                                                        +---------+---------------+---------+-----------+----------+--------------+  POP      Full           Yes      Yes                                 +---------+---------------+---------+-----------+----------+--------------+ PTV      Full                                                        +---------+---------------+---------+-----------+----------+--------------+ PERO      Full                                                        +---------+---------------+---------+-----------+----------+--------------+   +---------+---------------+---------+-----------+----------+--------------+ LEFT     CompressibilityPhasicitySpontaneityPropertiesThrombus Aging +---------+---------------+---------+-----------+----------+--------------+ CFV      Full           Yes      Yes                                 +---------+---------------+---------+-----------+----------+--------------+ SFJ      Full                                                        +---------+---------------+---------+-----------+----------+--------------+ FV Prox  Full                                                        +---------+---------------+---------+-----------+----------+--------------+ FV Mid   Full                                                        +---------+---------------+---------+-----------+----------+--------------+ FV DistalFull                                                        +---------+---------------+---------+-----------+----------+--------------+ PFV      Full                                                        +---------+---------------+---------+-----------+----------+--------------+ POP      Full           Yes      Yes                                 +---------+---------------+---------+-----------+----------+--------------+  PTV      Full                                                        +---------+---------------+---------+-----------+----------+--------------+ PERO     Full                                                        +---------+---------------+---------+-----------+----------+--------------+     Summary: RIGHT: - There is no evidence of deep vein thrombosis in the lower extremity.  - A cystic structure is found in the popliteal fossa.  LEFT: - There is no evidence of deep vein thrombosis in the  lower extremity.  - A cystic structure is found in the popliteal fossa.  *See table(s) above for measurements and observations. Electronically signed by Harold Barban MD on 10/24/2020 at 10:22:45 PM.    Final      Medical Consultants:   None.   Subjective:    BERNADETT MILIAN feels great no complaints  Objective:    Vitals:   10/25/20 1936 10/25/20 2320 10/26/20 0326 10/26/20 0712  BP: 119/75 118/74 136/86 112/77  Pulse: 61 (!) 57 69 62  Resp: 16 18 18 16   Temp: 97.8 F (36.6 C) 98.5 F (36.9 C) 97.8 F (36.6 C) 97.9 F (36.6 C)  TempSrc: Oral Oral Oral Oral  SpO2: 99% 96% 96% 97%  Weight:      Height:       SpO2: 97 %   Intake/Output Summary (Last 24 hours) at 10/26/2020 0853 Last data filed at 10/25/2020 1500 Gross per 24 hour  Intake --  Output 400 ml  Net -400 ml    Filed Weights   10/23/20 1121  Weight: 54.1 kg    Exam: General exam: In no acute distress. Respiratory system: Good air movement and clear to auscultation. Cardiovascular system: S1 & S2 heard, RRR. No JVD. Gastrointestinal system: Abdomen is nondistended, soft and nontender.  Extremities: No pedal edema. Skin: No rashes, lesions or ulcers  Data Reviewed:    Labs: Basic Metabolic Panel: Recent Labs  Lab 10/23/20 1133  NA 139  K 4.6  CL 107  CO2 23  GLUCOSE 100*  BUN 18  CREATININE 0.89  CALCIUM 8.9    GFR Estimated Creatinine Clearance: 46.6 mL/min (by C-G formula based on SCr of 0.89 mg/dL). Liver Function Tests: Recent Labs  Lab 10/23/20 1133  AST 28  ALT 21  ALKPHOS 69  BILITOT 1.0  PROT 6.0*  ALBUMIN 3.4*    No results for input(s): LIPASE, AMYLASE in the last 168 hours. No results for input(s): AMMONIA in the last 168 hours. Coagulation profile Recent Labs  Lab 10/23/20 1133  INR 0.9    COVID-19 Labs  No results for input(s): DDIMER, FERRITIN, LDH, CRP in the last 72 hours.  Lab Results  Component Value Date   SARSCOV2NAA NEGATIVE 10/23/2020    SARSCOV2NAA NEGATIVE 01/19/2019   [place] NEGATIVE 12/29/2018    CBC: Recent Labs  Lab 10/23/20 1133  WBC 6.4  NEUTROABS 4.8  HGB 12.6  HCT 39.1  MCV 100.3*  PLT 224    Cardiac Enzymes: No results for input(s):  CKTOTAL, CKMB, CKMBINDEX, TROPONINI in the last 168 hours. BNP (last 3 results) No results for input(s): PROBNP in the last 8760 hours. CBG: Recent Labs  Lab 10/23/20 1713  GLUCAP 84    D-Dimer: No results for input(s): DDIMER in the last 72 hours. Hgb A1c: Recent Labs    10/24/20 0423  HGBA1C 5.4    Lipid Profile: Recent Labs    10/24/20 0423  CHOL 176  HDL 68  LDLCALC 91  TRIG 87  CHOLHDL 2.6    Thyroid function studies: Recent Labs    10/24/20 0423  TSH 1.969    Anemia work up: No results for input(s): VITAMINB12, FOLATE, FERRITIN, TIBC, IRON, RETICCTPCT in the last 72 hours. Sepsis Labs: Recent Labs  Lab 10/23/20 1133  WBC 6.4    Microbiology Recent Results (from the past 240 hour(s))  Resp Panel by RT-PCR (Flu A&B, Covid) Nasopharyngeal Swab     Status: None   Collection Time: 10/23/20  7:25 PM   Specimen: Nasopharyngeal Swab; Nasopharyngeal(NP) swabs in vial transport medium  Result Value Ref Range Status   SARS Coronavirus 2 by RT PCR NEGATIVE NEGATIVE Final    Comment: (NOTE) SARS-CoV-2 target nucleic acids are NOT DETECTED.  The SARS-CoV-2 RNA is generally detectable in upper respiratory specimens during the acute phase of infection. The lowest concentration of SARS-CoV-2 viral copies this assay can detect is 138 copies/mL. A negative result does not preclude SARS-Cov-2 infection and should not be used as the sole basis for treatment or other patient management decisions. A negative result may occur with  improper specimen collection/handling, submission of specimen other than nasopharyngeal swab, presence of viral mutation(s) within the areas targeted by this assay, and inadequate number of viral copies(<138  copies/mL). A negative result must be combined with clinical observations, patient history, and epidemiological information. The expected result is Negative.  Fact Sheet for Patients:  EntrepreneurPulse.com.au  Fact Sheet for Healthcare Providers:  IncredibleEmployment.be  This test is no t yet approved or cleared by the [place] FDA and  has been authorized for detection and/or diagnosis of SARS-CoV-2 by FDA under an Emergency Use Authorization (EUA). This EUA will remain  in effect (meaning this test can be used) for the duration of the COVID-19 declaration under Section 564(b)(1) of the Act, 21 U.S.C.section 360bbb-3(b)(1), unless the authorization is terminated  or revoked sooner.       Influenza A by PCR NEGATIVE NEGATIVE Final   Influenza B by PCR NEGATIVE NEGATIVE Final    Comment: (NOTE) The Xpert Xpress SARS-CoV-2/FLU/RSV plus assay is intended as an aid in the diagnosis of influenza from Nasopharyngeal swab specimens and should not be used as a sole basis for treatment. Nasal washings and aspirates are unacceptable for Xpert Xpress SARS-CoV-2/FLU/RSV testing.  Fact Sheet for Patients: EntrepreneurPulse.com.au  Fact Sheet for Healthcare Providers: IncredibleEmployment.be  This test is not yet approved or cleared by the [place] FDA and has been authorized for detection and/or diagnosis of SARS-CoV-2 by FDA under an Emergency Use Authorization (EUA). This EUA will remain in effect (meaning this test can be used) for the duration of the COVID-19 declaration under Section 564(b)(1) of the Act, 21 U.S.C. section 360bbb-3(b)(1), unless the authorization is terminated or revoked.  Performed at [facility], [place] [address] [place], [place] [address]      Medications:    aspirin EC  81 mg Oral Daily   atorvastatin  80 mg Oral Daily   clopidogrel  75 mg  Oral Daily   enoxaparin  (LOVENOX) injection  40 mg Subcutaneous Q24H   galantamine  8 mg Oral Q lunch   sertraline  150 mg Oral Daily   Continuous Infusions:      LOS: 2 days   Charlynne Cousins  [facility]  10/26/2020, 8:53 AM

## 2020-10-26 NOTE — PMR Pre-admission (Signed)
Physical Medicine and Rehabilitation Admission H&P    No chief complaint on file. : HPI: 75 year old female with medical hx significant for: mild dementia, essential HTN, sleep apnea, HLD. Pt presented to ED on 10/23/20 d/t acute left- sided weakness and dizziness. Pt was not a candidate for tPA d/t initial CT showing chronic right MCA frontal infarct. Pt was preparing to be discharged home when she had recurrent left-sided weakness, left limb ataxia, and left neglect. Code stroke activated. CTA was negative for intracranial LVO. MRI revealed small acute infarct in right superior insula and corona radiata and small infarcts in right occipital lobe. Echo showed EF of 55-60%.  Pt and pt's daughter reported heart palpitation and irregular heartbeat.  Loop recorder inserted on 10/24/20.  Therapy evaluations completed and CIR recommended d/t pt's deficits in mobility, balance, cognition, and ability to complete ADLs independently  Review of Systems  Constitutional:  Negative for chills and fever.  HENT:  Negative for congestion, ear discharge and nosebleeds.   Eyes:  Negative for blurred vision, double vision, discharge and redness.  Respiratory:  Negative for cough, shortness of breath, wheezing and stridor.   Cardiovascular:  Negative for chest pain and leg swelling.  Gastrointestinal:  Negative for nausea and vomiting.  Genitourinary:  Negative for flank pain and hematuria.  Musculoskeletal:  Negative for back pain and neck pain.  Skin:  Negative for itching.  Neurological:  Positive for weakness. Negative for sensory change and headaches.  Endo/Heme/Allergies: Negative.   Psychiatric/Behavioral:  Positive for memory loss. Negative for depression.   Past Medical History:  Diagnosis Date   Anxiety    Arthritis    Complication of anesthesia    slow to wake up   Dementia ([place]) 2018   memory loss   Depression    Hyperlipidemia    Hypertension    Osteopenia    Sleep apnea    Past  Surgical History:  Procedure Laterality Date   COLONOSCOPY     INTRACAPSULAR CATARACT EXTRACTION     ROTATOR CUFF REPAIR     TOTAL HIP ARTHROPLASTY Left 01/02/2019   Procedure: TOTAL HIP ARTHROPLASTY ANTERIOR APPROACH;  Surgeon: Paralee Cancel, MD;  Location: [facility];  Service: Orthopedics;  Laterality: Left;  70 mins   TUBAL LIGATION     Family History  Problem Relation Age of Onset   Hypertension Mother    Ovarian cancer Mother    Heart disease Father    Colon cancer Neg Hx    Social History:  reports that she has quit smoking. Her smoking use included cigarettes. She has a 1.00 pack-year smoking history. She has never used smokeless tobacco. She reports current alcohol use of about 7.0 standard drinks of alcohol per week. She reports that she does not use drugs. Allergies:  Allergies  Allergen Reactions   Latex     Pt stated she had a reaction to latex in the past. H. Mickley RN   Tape Other (See Comments)    redness   Medications Prior to Admission  Medication Sig Dispense Refill   aspirin EC 81 MG EC tablet Take 1 tablet (81 mg total) by mouth daily. Swallow whole. 30 tablet 11   clopidogrel (PLAVIX) 75 MG tablet Take 1 tablet (75 mg total) by mouth daily.     docusate sodium (COLACE) 100 MG capsule Take 1 capsule (100 mg total) by mouth 2 (two) times daily. (Patient not taking: Reported on 10/23/2020) 28 capsule 0  galantamine (RAZADYNE ER) 8 MG 24 hr capsule Take 8 mg by mouth daily with lunch.      lisinopril (PRINIVIL,ZESTRIL) 10 MG tablet Take 10 mg by mouth every evening.   11   Pediatric Multivitamins-Iron (FLINTSTONES PLUS IRON PO) Take 1 tablet by mouth daily with lunch.      sertraline (ZOLOFT) 100 MG tablet Take 150 mg by mouth daily.     simvastatin (ZOCOR) 40 MG tablet Take 40 mg by mouth every evening.     vitamin B-12 (CYANOCOBALAMIN) 500 MCG tablet Take 500 mcg by mouth daily.     Vitamin D, Ergocalciferol, (DRISDOL) 1.25 MG (50000 UT) CAPS capsule Take 50,000  Units by mouth every 7 (seven) days. thurs      Drug Regimen Review  Drug regimen was reviewed and remains appropriate with no significant issues identified  Home: Home Living Family/patient expects to be discharged to:: Private residence Living Arrangements: Alone   Functional History:    Functional Status:  Mobility:          ADL:    Cognition: Cognition Orientation Level: Oriented X4    Physical Exam: Blood pressure 117/79, pulse 63, temperature 98.1 F (36.7 C), temperature source Oral, resp. rate 16, height 5\' 4"  (1.626 m), weight 55.8 kg, SpO2 96 %. Physical Exam Constitutional:      Appearance: Normal appearance.  HENT:     Head: Normocephalic and atraumatic.     Mouth/Throat:     Mouth: Mucous membranes are moist.  Eyes:     Extraocular Movements: Extraocular movements intact.     Conjunctiva/sclera: Conjunctivae normal.     Pupils: Pupils are equal, round, and reactive to light.  Cardiovascular:     Rate and Rhythm: Normal rate and regular rhythm.     Heart sounds: Normal heart sounds. No murmur heard. Pulmonary:     Effort: Pulmonary effort is normal.     Breath sounds: Normal breath sounds. No stridor.  Abdominal:     General: Abdomen is flat. Bowel sounds are normal. There is no distension.     Palpations: Abdomen is soft.  Musculoskeletal:        General: No deformity.     Cervical back: Neck supple.     Right lower leg: No edema.     Left lower leg: No edema.  Skin:    General: Skin is warm and dry.  Neurological:     Mental Status: She is alert and oriented to person, place, and time.     Motor: Weakness present.     Coordination: Coordination abnormal. Finger-Nose-Finger Test abnormal and Heel to Shin Test abnormal.     Gait: Gait abnormal.     Comments: Motor 4/5 left delt bi, trik grip HF, KE, ADF Left UE ataxia  Psychiatric:        Mood and Affect: Mood normal.        Behavior: Behavior normal.    No results found for this or  any previous visit (from the past 48 hour(s)). EP PPM/ICD IMPLANT  Result Date: 10/24/2020 SURGEON:  Allegra Lai, MD   PREPROCEDURE DIAGNOSIS:  Cryptogenic Stroke   POSTPROCEDURE DIAGNOSIS:  Cryptogenic Stroke    PROCEDURES:  1. Implantable loop recorder implantation   INTRODUCTION:  Pamela Mason is a 75 y.o. female with a history of unexplained stroke who presents today for implantable loop implantation.  The patient has had a cryptogenic stroke.  Despite an extensive workup by neurology, no reversible causes have been  identified.  she has worn telemetry during which she did not have arrhythmias.  There is significant concern for possible atrial fibrillation as the cause for the patients stroke.  The patient therefore presents today for implantable loop implantation.   DESCRIPTION OF PROCEDURE:  Informed written consent was obtained, and the patient was brought to the electrophysiology lab in a fasting state.  The patient required no sedation for the procedure today.  Mapping over the patient's chest was performed by the EP lab staff to identify the area where electrograms were most prominent for ILR recording.  This area was found to be the left parasternal region over the 3rd-4th intercostal space. The patients left chest was therefore prepped and draped in the usual sterile fashion by the EP lab staff. The skin overlying the left parasternal region was infiltrated with lidocaine for local analgesia.  A 0.5-cm incision was made over the left parasternal region over the 3rd intercostal space.  A subcutaneous ILR pocket was fashioned using a combination of sharp and blunt dissection.  A Medtronic Reveal Linq model [place] [id number] implantable loop recorder was then placed into the pocket  R waves were very prominent and measured 0.26mV. EBL<1 ml.  Steri- Strips and a sterile dressing were then applied.  There were no early apparent complications.   CONCLUSIONS:  1. Successful implantation of a Medtronic  Reveal LINQ implantable loop recorder for cryptogenic stroke  2. No early apparent complications.       Medical Problem List and Plan: 1.  Decrease self care and mobility  secondary to Right subcortical infarct  -patient may  shower  -ELOS/Goals: 12-14d sup goals F/u neuro 4 wks post rehab dc 2.  Antithrombotics: -DVT/anticoagulation:  Pharmaceutical: Lovenox  -antiplatelet therapy: ASA 81mg , and clopidigrel75mg  x 3 wk then ASA alone 3. Pain Management: Tylenol prn 4. Mood:Sertraline 100mg  daily  -antipsychotic agents: none 5. Neuropsych: This patient is capable of making decisions on her own behalf would need family assistance for more complex financial and medical decisions. 6. Skin/Wound Care: monitor 7. Fluids/Electrolytes/Nutrition: low sodium HDPD 8.  HTN lisinopril 10mg  9.  Mild dementia Galantamine 8mg  per day 10.  HLD simvastatin 40mg  daily 11.  Vit D deficiency Ergocalciferol 1.25 mg daily 12.  B12 supplemkent 553mcg daily 13. Constipation colace 1 po qd   Charlett Blake, MD 10/26/2020

## 2020-10-26 NOTE — Progress Notes (Signed)
Signed                                                                                                                                                                                                                                                                                                                                                                                                                                                                                   PMR Admission Coordinator Pre-Admission Assessment   Patient: Pamela Mason is an 75 y.o., female MRN: [id number] DOB: Mar 19, 1946 Height: 5' 4"  (162.6 cm) Weight: 54.1 kg   Insurance Information HMO:     PPO:      PCP:      IPA:      80/20: yes     OTHER: PRIMARY: Medicare A & B      Policy#: [id number]      Subscriber: [name] Name:       Phone#:      Fax#: Pre-Cert#:       Employer: Benefits:  Phone #: verified eligibiliy via [place] on 10/25/20     Name: Eff. Date: Part A effective 11/07/09, Part B effective 11/07/13  Deduct: $1,556      Out of Pocket Max: NA      Life Max: NA CIR: 100% with Medicare approval      SNF: 100% days 1-20, 80% days 21-100 Outpatient: 80%     Co-Pay: 20% Home Health: 100%      Co-Pay: DME: 80%     Co-Pay: 20% Providers: pt's choice SECONDARY: BCBS Supplement      Policy#: [id number]     Phone#: [phone]   Financial Counselor:       Phone#:   The "Data Collection Information Summary" for patients in Inpatient Rehabilitation Facilities with attached "Privacy Act [place] Records" was provided and verbally reviewed with: Patient and Family   Emergency Contact Information Contact Information       Name Relation Home Work [place] Daughter     [phone]    Dedria, Endres [phone]    [phone]    [place] Daughter [phone]   [phone]    Angelina Pih [phone]   [phone]           Current Medical History  Patient Admitting Diagnosis: CVA   History of Present Illness: Pt is a 75 year old female with medical hx significant for: mild dementia, essential HTN, sleep apnea, HLD. Pt presented to ED on 10/23/20 d/t acute left- sided weakness and dizziness. Pt was not a candidate for tPA d/t initial CT showing chronic right MCA frontal infarct. Pt was preparing to be discharged home when she had recurrent left-sided weakness, left limb ataxia, and left neglect. Code stroke activated. CTA was negative for intracranial LVO. MRI revealed small acute infarct in right superior insula and corona radiata and small infarcts in right occipital lobe. Echo showed EF of 55-60%.  Pt and pt's daughter reported heart palpitation and irregular heartbeat.  Loop recorder inserted on 10/24/20.  Therapy evaluations completed and CIR recommended d/t pt's deficits in mobility, balance, cognition, and ability to complete ADLs independently.   Complete NIHSS TOTAL: 3   Patient's medical record from [facility] has been reviewed by the rehabilitation admission coordinator and physician.   Past Medical History      Past Medical History:  Diagnosis Date   Anxiety     Arthritis     Complication of anesthesia      slow to wake up   Dementia ([place]) 2018    memory loss   Depression     Hyperlipidemia     Hypertension     Osteopenia     Sleep apnea        Family History   family history includes Heart disease in her father; Hypertension in her mother; Ovarian cancer in her mother.   Prior Rehab/Hospitalizations Has the patient had prior rehab or hospitalizations prior to admission? No   Has the patient had major surgery during 100 days prior to admission? No               Current Medications   Current Facility-Administered Medications:    acetaminophen (TYLENOL) tablet 650 mg, 650 mg, Oral, Q4H PRN **OR** acetaminophen (TYLENOL) 160 MG/5ML solution 650 mg, 650 mg, Per Tube, Q4H PRN **OR** acetaminophen (TYLENOL) suppository 650 mg, 650 mg, Rectal, Q4H PRN, Lenore Cordia, MD   aspirin EC tablet 81 mg, 81 mg, Oral, Daily, Kirby-Graham, Karsten Fells, NP, 81 mg at 10/26/20 0916   atorvastatin (LIPITOR) tablet 80 mg, 80 mg, Oral, Daily, Adelene Amas, PA-C, 80 mg at 10/26/20 [phone]  clopidogrel (PLAVIX) tablet 75 mg, 75 mg, Oral, Daily, Kirby-Graham, Karsten Fells, NP, 75 mg at 10/26/20 0916   enoxaparin (LOVENOX) injection 40 mg, 40 mg, Subcutaneous, Q24H, Patel, Vishal R, MD, 40 mg at 10/25/20 2033   galantamine (RAZADYNE ER) 24 hr capsule 8 mg, 8 mg, Oral, Q lunch, Zada Finders R, MD, 8 mg at 10/25/20 1119   senna-docusate (Senokot-S) tablet 1 tablet, 1 tablet, Oral, QHS PRN, Lenore Cordia, MD   sertraline (ZOLOFT) tablet 150 mg, 150 mg, Oral, Daily, Zada Finders R, MD, 150 mg at 10/26/20 2952   Patients Current Diet:  Diet Order                  Diet - low sodium heart healthy             Diet regular Room service appropriate? Yes with Assist; Fluid consistency: Thin  Diet effective now                         Precautions / Restrictions Precautions Precautions: Fall Precaution Comments: ataxia Restrictions Weight Bearing Restrictions: No    Has the patient had 2 or more falls or a fall with injury in the past year? No   Prior Activity Level Limited Community (1-2x/wk): leaves house for MD appointments   Prior Functional Level Self Care: Did the patient need help bathing, dressing, using the toilet or eating? Independent   Indoor Mobility: Did the patient need assistance with walking from room to room (with or without device)? Independent   Stairs: Did the patient need assistance with internal or external stairs (with or without device)? Independent   Functional Cognition: Did the patient need help planning  regular tasks such as shopping or remembering to take medications? Needed some help   Home Assistive Devices / [place] Devices/Equipment: None   Prior Device Use: Indicate devices/aids used by the patient prior to current illness, exacerbation or injury? None of the above   Current Functional Level Cognition   Overall Cognitive Status: History of cognitive impairments - at baseline Current Attention Level: Sustained, Selective Orientation Level: Oriented X4 Following Commands: Follows one step commands with increased time Safety/Judgement: Decreased awareness of safety, Decreased awareness of deficits General Comments: Following all commands with increased time. STM deficits at baseline. Decreased awareness of deficits and safety    Extremity Assessment (includes Sensation/Coordination)   Upper Extremity Assessment: Generalized weakness, LUE deficits/detail LUE Deficits / Details: Pt with decreased ROM in shoulder flexion, and slightly weaker grip strength of L hand (4/5) as compared to R hand (4+/5). Able to perform finger opposition with increased effort. Pt with decreased coordination as seen by undershooting when completing finger-nose test with LUE.  Lower Extremity Assessment: Defer to PT evaluation LLE Deficits / Details: grossly 4+/5 with MMT but demonstrates functional weakness vs inattention/ataxia LLE Sensation: decreased proprioception LLE Coordination: decreased fine motor, decreased gross motor     ADLs   Overall ADL's : Needs assistance/impaired Eating/Feeding: Supervision/ safety, Sitting, Set up Grooming: Oral care, Standing, Moderate assistance Grooming Details (indicate cue type and reason): Pt attempting to apply toothpaste to toothbrush with lid on. Pt able to self correct. Requiring mod-max A for standing balance. Upper Body Bathing: Minimal assistance, Sitting, Cueing for sequencing, Cueing for safety Lower Body Bathing: Maximal assistance, Sit  to/from stand Upper Body Dressing : Minimal assistance, Cueing for sequencing, Sitting Lower Body Dressing: Maximal assistance, Sit to/from stand Lower Body Dressing Details (indicate  cue type and reason): Pt donning socks sitting EOB with min guard and intermittent min A, however, Pt requiring max A forbalance during standing tasks. Toilet Transfer: Minimal assistance, BSC, Ambulation Toilet Transfer Details (indicate cue type and reason): Min A to achieve upright posture when standing from [facility] Toileting- Clothing Manipulation and Hygiene: Maximal assistance, Sit to/from stand Functional mobility during ADLs: Maximal assistance General ADL Comments: Pt requiring max A with handheld assist during functional mobility within room due to decresed balance and ataxia.     Mobility   Overal bed mobility: Needs Assistance Bed Mobility: Sit to Supine, Rolling, Sidelying to Sit Rolling: Min guard Sidelying to sit: Min assist Supine to sit: Min guard Sit to supine: Min guard General bed mobility comments: Cues for rolling towards left side, HOB flat, light minA for trunk assist to upright. Min guard for return to supine. Increased time/effort     Transfers   Overall transfer level: Needs assistance Equipment used: None Transfers: Sit to/from Stand Sit to Stand: Min assist, Mod assist General transfer comment: Min-modA to rise and steady from edge of bed and chair, cues for foot placement, left knee block provided     Ambulation / Gait / Stairs / Wheelchair Mobility   Ambulation/Gait Ambulation/Gait assistance: Mod assist Gait Distance (Feet): 10 Feet (5", 5") Assistive device: None Gait Pattern/deviations: Decreased step length - left, Decreased stance time - left, Decreased stride length, Decreased weight shift to left, Ataxic, Narrow base of support General Gait Details: Pt ambulating 5 ft, then an additional 5 ft with a seated rest break. Emphasis and multimodal cues on wider BOS, stepping  initiation, upright posture, upward gaz,e hip extension, left knee extension. Mild buckle with left knee blocked; able to advance LLE without physical assist Gait velocity: decreased Gait velocity interpretation: <1.31 ft/sec, indicative of household ambulator     Posture / Balance Dynamic Sitting Balance Sitting balance - Comments: Pt able to don socks sitting EOB with min guard A- Min A and mod verbal cues for sitting balance Balance Overall balance assessment: Needs assistance Sitting-balance support: Feet supported, No upper extremity supported Sitting balance-Leahy Scale: Fair Sitting balance - Comments: Pt able to don socks sitting EOB with min guard A- Min A and mod verbal cues for sitting balance Postural control: Posterior lean Standing balance support: During functional activity, No upper extremity supported Standing balance-Leahy Scale: Poor Standing balance comment: reliant on external support     Special needs/care consideration Skin Surgical incision: sternum/Left upper, Bowel and Bladder incontinence, External urinary catheter and Designated visitor Aurora Mask, daughter; Gerda Yin, son ; Willia Craze, daughter    Previous Home Environment (from acute therapy documentation) Living Arrangements: Alone Available Help at Discharge: Family, Available PRN/intermittently Type of Home: House Home Layout: Two level, 1/2 bath on main level Alternate Level Stairs-Rails: Left Alternate Level Stairs-Number of Steps: 15 Home Access: Stairs to enter Entrance Stairs-Rails: Right, Left Entrance Stairs-Number of Steps: 2 in front, 6-7 in back Bathroom Shower/Tub: Chiropodist: Standard Bathroom Accessibility: Yes How Accessible: Accessible via walker Home Care Services: No Additional Comments: Pt had plans to move to ILF at [place]. Pt and family reporting willingness and desire to change plans to go to CIR first   Discharge Living Setting Plans  for Discharge Living Setting: Other (Comment) (Plans to move into ILF at [place]) Type of Home at Discharge: [place] Name at Discharge: [place] Discharge Home Layout: One level Discharge Home Access: Level entry Discharge  Bathroom Accessibility: Yes Does the patient have any problems obtaining your medications?: No   Social/Family/Support Systems Caregiver Availability: 24/7 Discharge Plan Discussed with Primary Caregiver: Yes Is Caregiver In Agreement with Plan?: Yes Does Caregiver/Family have Issues with Lodging/Transportation while Pt is in Rehab?: No   Goals Patient/Family Goal for Rehab: Supervision: PT/OT/ST Expected length of stay: 12-14 days Pt/Family Agrees to Admission and willing to participate: Yes Program Orientation Provided & Reviewed with Pt/Caregiver Including Roles  & Responsibilities: Yes   Decrease burden of Care through IP rehab admission: NA   Possible need for SNF placement upon discharge: Not anticipated   Patient Condition: I have reviewed medical [facility], spoken with CM, and patient and daughter. I met with patient at the bedside for inpatient rehabilitation assessment.  Patient will benefit from ongoing PT, OT, and SLP, can actively participate in 3 hours of therapy a day 5 days of the week, and can make measurable gains during the admission.  Patient will also benefit from the coordinated team approach during an Inpatient Acute Rehabilitation admission.  The patient will receive intensive therapy as well as Rehabilitation physician, nursing, social worker, and care management interventions.  Due to bladder management, bowel management, safety, skin/wound care, disease management, medication administration, and patient education the patient requires 24 hour a day rehabilitation nursing.  The patient is currently Min G-Mod A with mobility and Min-Max A with basic ADLs.  Discharge setting and therapy  post discharge at [facility] is anticipated.  Patient has agreed to participate in the Acute Inpatient Rehabilitation Program and will admit today.   Preadmission Screen Completed By:  Bethel Born, 10/26/2020 9:31 AM ______________________________________________________________________   Discussed status with Dr. Letta Pate on 10/26/20  at 9:31 AM and received approval for admission today.   Admission Coordinator:  Bethel Born, CCC-SLP, time 9:31 AM/Date 10/26/20     Assessment/Plan: Diagnosis:RIght subcortical infarcts Does the need for close, 24 hr/day Medical supervision in concert with the patient's rehab needs make it unreasonable for this patient to be served in a less intensive setting? Yes Co-Morbidities requiring supervision/potential complications: HTN, mild dementia Due to bladder management, bowel management, safety, skin/wound care, disease management, medication administration, pain management, and patient education, does the patient require 24 hr/day rehab nursing? Yes Does the patient require coordinated care of a physician, rehab nurse, PT, OT, and SLP to address physical and functional deficits in the context of the above medical diagnosis(es)? Yes Addressing deficits in the following areas: balance, endurance, locomotion, strength, transferring, bowel/bladder control, bathing, dressing, feeding, grooming, toileting, cognition, and psychosocial support Can the patient actively participate in an intensive therapy program of at least 3 hrs of therapy 5 days a week? Yes The potential for patient to make measurable gains while on inpatient rehab is good Anticipated functional outcomes upon discharge from inpatient rehab: supervision PT, supervision OT, supervision SLP Estimated rehab length of stay to reach the above functional goals is: 12-14d Anticipated discharge destination: Home 10. Overall Rehab/Functional Prognosis: good     MD  Signature: Charlett Blake M.D. [employer] Fellow Am Acad of Phys Med and Rehab Diplomate Am Board of Electrodiagnostic Med Fellow Am Board of Interventional Pain

## 2020-10-27 ENCOUNTER — Encounter (HOSPITAL_COMMUNITY): Payer: Self-pay | Admitting: Cardiology

## 2020-10-27 MED ORDER — POLYETHYLENE GLYCOL 3350 17 G PO PACK
17.0000 g | PACK | Freq: Every day | ORAL | Status: DC
  Administered 2020-10-27 – 2020-11-11 (×12): 17 g via ORAL
  Filled 2020-10-27 (×16): qty 1

## 2020-10-27 MED ORDER — FLEET ENEMA 7-19 GM/118ML RE ENEM: 1.0000 | ENEMA | Freq: Every day | RECTAL | Status: DC | PRN

## 2020-10-27 MED ORDER — BISACODYL 10 MG RE SUPP: 10.0000 mg | Freq: Every day | RECTAL | Status: DC | PRN

## 2020-10-27 MED ORDER — BLOOD PRESSURE CONTROL BOOK
Freq: Once | Status: AC
  Filled 2020-10-27: qty 1

## 2020-10-27 MED FILL — Lidocaine Inj 1% w/ Epinephrine-1:100000: INTRAMUSCULAR | Qty: 20 | Status: AC

## 2020-10-27 NOTE — Progress Notes (Addendum)
Patient ID: Pamela Mason, female   DOB: Aug 16, 1945, 75 y.o.   MRN: [id number] Met with the patient and daughters to introduce self and the role of the nurse CM. Reviewed secondary stroke risks including HTN, and HLD (LDL 91). DAPT x 3 weeks then ASA solo per MD. Reviewed handouts on DASH diet and dietary modification recommendations with medications. Loop recorder intact with phone at bedside. Continue to follow along to discharge to address educational needs. Collaborate with the SW to facilitate preparation for discharge home. Patient and daughters reporting poor sleeping at night; patient noted sleep for short while then wakes up; nurse notified of request for sleep aide at night. Margarito Liner

## 2020-10-27 NOTE — Progress Notes (Signed)
Occupational Therapy Session Note  Patient Details  Name: Pamela Mason MRN: [id number] Date of Birth: [birth year]  Today's Date: 10/27/2020 OT Individual Time: 1300-1330 OT Individual Time Calculation (min): 30 min     Skilled Therapeutic Interventions/Progress Updates:    Pt received in bed with daughters present and consented to OT tx. Session focused on fine and gross motor coordination tasks with PVC pipe activity with pt instructed to match pieces to reference cards. Pt completed 2 activities with increased time and instruction to use LUE, which was mildly ataxic, to increase overall coordination. Pt competed activities sitting EOB with SUP. After tx, pt helped to lie back down and left with all needs met and bed alarm on.    Therapy Documentation Precautions:  Precautions Precautions: Fall Precaution Comments: ataxia Restrictions Weight Bearing Restrictions: No  Pain: Pain Assessment Pain Score: 0-No pain    Therapy/Group: Individual Therapy  Pamela Mason 10/27/2020, 2:05 PM

## 2020-10-27 NOTE — Evaluation (Signed)
Occupational Therapy Assessment and Plan  Patient Details  Name: Pamela Mason MRN: [id number] Date of Birth: August 05, 1945  OT Diagnosis: abnormal posture, altered mental status, cognitive deficits, hemiplegia affecting non-dominant side, and muscle weakness (generalized) Rehab Potential: Rehab Potential (ACUTE ONLY): Excellent ELOS: 14-17 days   Today's Date: 10/27/2020 OT Individual Time: 1001-1109 OT Individual Time Calculation (min): 68 min     Hospital Problem: Principal Problem:   Subcortical infarction [facility])   Past Medical History:  Past Medical History:  Diagnosis Date   Anxiety    Arthritis    Complication of anesthesia    slow to wake up   Dementia ([place]) 2018   memory loss   Depression    Hyperlipidemia    Hypertension    Osteopenia    Sleep apnea    Past Surgical History:  Past Surgical History:  Procedure Laterality Date   COLONOSCOPY     INTRACAPSULAR CATARACT EXTRACTION     LOOP RECORDER INSERTION N/A 10/24/2020   Procedure: LOOP RECORDER INSERTION;  Surgeon: Constance Haw, MD;  Location: [facility];  Service: Cardiovascular;  Laterality: N/A;   ROTATOR CUFF REPAIR     TOTAL HIP ARTHROPLASTY Left 01/02/2019   Procedure: TOTAL HIP ARTHROPLASTY ANTERIOR APPROACH;  Surgeon: Paralee Cancel, MD;  Location: [facility];  Service: Orthopedics;  Laterality: Left;  70 mins   TUBAL LIGATION      Assessment & Plan Clinical Impression: [name] with recent admission to the hospital on 10/23/20 d/t acute left- sided weakness and dizziness. Pt was not a candidate for tPA d/t initial CT showing chronic right MCA frontal infarct. Pt was preparing to be discharged home when she had recurrent left-sided weakness, left limb ataxia, and left neglect. Code stroke activated. Patient transferred to CIR on 10/26/2020 .    Patient currently requires mod with basic self-care skills secondary to muscle weakness and muscle paralysis, impaired timing  and sequencing, unbalanced muscle activation, ataxia, and decreased coordination, decreased visual motor skills, decreased attention to left, decreased attention, decreased awareness, decreased problem solving, decreased safety awareness, and decreased memory, and decreased sitting balance, decreased standing balance, decreased postural control, hemiplegia, and decreased balance strategies.  Prior to hospitalization, patient could complete ADLs with independent .  Patient will benefit from skilled intervention to decrease level of assist with basic self-care skills and increase independence with basic self-care skills prior to discharge  to independent living or other arrangement made by family .  Anticipate patient will require 24 hour supervision and follow up home health.  OT - End of Session Activity Tolerance: Decreased this session Endurance Deficit: Yes OT Assessment Rehab Potential (ACUTE ONLY): Excellent OT Barriers to Discharge: Decreased caregiver support OT Barriers to Discharge Comments: Will need setup of 24 hr supervision from family OT Patient demonstrates impairments in the following area(s): Balance;Cognition;Endurance;Motor;Vision;Sensory;Safety OT Basic ADL's Functional Problem(s): Eating;Grooming;Bathing;Dressing;Toileting OT Transfers Functional Problem(s): Toilet;Tub/Shower OT Additional Impairment(s): Fuctional Use of Upper Extremity OT Plan OT Intensity: Minimum of 1-2 x/day, 45 to 90 minutes OT Frequency: 5 out of 7 days OT Duration/Estimated Length of Stay: 14-17 days OT Treatment/Interventions: Balance/vestibular training;Cognitive remediation/compensation;Community reintegration;DME/adaptive equipment instruction;Discharge planning;Disease mangement/prevention;Pain management;Self Care/advanced ADL retraining;Therapeutic Activities;UE/LE Coordination activities;Therapeutic Exercise;Patient/family education;Functional mobility training;Neuromuscular re-education;UE/LE  Strength taining/ROM;Visual/perceptual remediation/compensation OT Self Feeding Anticipated Outcome(s): modified independent OT Basic Self-Care Anticipated Outcome(s): supervision OT Toileting Anticipated Outcome(s): supervision OT Bathroom Transfers Anticipated Outcome(s): supervision OT Recommendation Recommendations for Other Services: Therapeutic Recreation consult Therapeutic Recreation  Interventions: Stress management Patient destination: Home Follow Up Recommendations: 24 hour supervision/assistance;Home health OT Equipment Recommended: To be determined   OT Evaluation Precautions/Restrictions  Precautions Precautions: Fall Precaution Comments: RUE and RLE ataxia Restrictions Weight Bearing Restrictions: No  Pain Pain Assessment Pain Score: 0-No pain Home Living/Prior Functioning Home Living Family/patient expects to be discharged to:: Private residence Living Arrangements: Alone Available Help at Discharge: Family, Available PRN/intermittently Type of Home: Independent living facility Home Access: Level entry Home Layout: Two level, 1/2 bath on main level Bathroom Shower/Tub: Walk-in shower (at [employer]) Bathroom Toilet: Handicapped height Bathroom Accessibility: Yes Additional Comments: Pt had plans to move to ILF at [facility], famiy will have to discuss with facility options for 24 hr care initially.  Lives With: Alone IADL History Homemaking Responsibilities: Yes Current License: Yes Mode of Transportation: Car (she drove to the bank and short distances recently) Occupation: Retired Leisure and Hobbies: Likes to work in the garden and read Prior Function Level of Independence: Independent with basic ADLs, Independent with homemaking with ambulation, Independent with gait, Independent with transfers  Able to Take Stairs?: Yes Driving: Yes Comments: Daughter recently living with patient. Plan was for patient to move into ILF at [place] soon. Hx of dementia.  Daughter reports STM deficits are worsening Vision Baseline Vision/History: Wears glasses Wears Glasses: At all times Patient Visual Report: No change from baseline Vision Assessment?: Yes Ocular Range of Motion: Within Functional Limits Alignment/Gaze Preference: Within Defined Limits Tracking/Visual Pursuits: Decreased smoothness of horizontal tracking;Decreased smoothness of vertical tracking;Requires cues, head turns, or add eye shifts to track;Impaired - to be further tested in functional context Convergence: Within functional limits Visual Fields: No apparent deficits Perception  Perception: Impaired Inattention/Neglect: Does not attend to left side of body Praxis Praxis: Intact Praxis Impairment Details: Motor planning;Initiation Cognition Overall Cognitive Status: Impaired/Different from baseline Arousal/Alertness: Awake/alert Orientation Level: Person;Place;Situation Person: Oriented Situation: Oriented Year: 2022 Month: June Day of Week: Correct Memory: Impaired Memory Impairment: Storage deficit;Decreased recall of new information Immediate Memory Recall: Sock;Blue;Bed Memory Recall Sock: Without Cue Memory Recall Blue: With Cue Memory Recall Bed: Not able to recall Attention: Sustained;Selective Sustained Attention: Appears intact Sustained Attention Impairment: Verbal basic;Functional basic Selective Attention: Impaired Awareness: Impaired Awareness Impairment: Anticipatory impairment;Emergent impairment Problem Solving: Impaired Problem Solving Impairment: Functional basic;Verbal basic Safety/Judgment: Impaired Sensation Sensation Light Touch: Appears Intact Hot/Cold: Appears Intact Proprioception: Not tested Stereognosis: Not tested Additional Comments: Light touch intact in BUEs Coordination Gross Motor Movements are Fluid and Coordinated: No Fine Motor Movements are Fluid and Coordinated: No Coordination and Movement Description: Pt was able to use  the LUE at a diminshed level with selfcare tasks througout session.  Slight decreased ability to fasten her bra or tie her shoes efficently Finger Nose Finger Test: Moderate ataxia noted in the LUE when attempting finger to nose testing. Motor  Motor Motor: Hemiplegia;Ataxia;Abnormal postural alignment and control Motor - Skilled Clinical Observations: LUE and LLE hemiparesis with ataxia  Trunk/Postural Assessment  Cervical Assessment Cervical Assessment: Exceptions to [facility] (forward head) Thoracic Assessment Thoracic Assessment: Exceptions to [facility] (thoracic rounding) Lumbar Assessment Lumbar Assessment: Exceptions to [facility] (posterior pelvic tilt) Postural Control Postural Control: Deficits on evaluation Righting Reactions: delayed and inadequate Protective Responses: delayed and inadequate  Balance Balance Balance Assessed: Yes Standardized Balance Assessment Standardized Balance Assessment: Timed Up and Go Test Timed Up and Go Test TUG: Normal TUG Normal TUG (seconds): 62.64 Static Sitting Balance Static Sitting - Balance Support: Feet supported Static Sitting - Level of Assistance:  5: Stand by assistance Dynamic Sitting Balance Dynamic Sitting - Balance Support: During functional activity Dynamic Sitting - Level of Assistance:  (contact guard) Static Standing Balance Static Standing - Balance Support: During functional activity;No upper extremity supported Static Standing - Level of Assistance: 3: Mod assist Dynamic Standing Balance Dynamic Standing - Balance Support: No upper extremity supported Dynamic Standing - Level of Assistance: 2: Max assist Extremity/Trunk Assessment RUE Assessment RUE Assessment: Within Functional Limits General Strength Comments: Strength 4/5 throughout for gross testing LUE Assessment LUE Assessment: Exceptions to [facility] Active Range of Motion (AROM) Comments: WFLS for all joints General Strength Comments: Strength 4-/5 throughout with increased  ataxia noted with finger to nose as well as decreased FM coordination when completing opposition of finger to thumb.  Care Tool Care Tool Self Care Eating   Eating Assist Level: Set up assist    Oral Care    Oral Care Assist Level: Set up assist    Bathing   Body parts bathed by patient: Right arm;Left arm;Chest;Abdomen;Right upper leg;Left upper leg;Right lower leg;Left lower leg;Face Body parts bathed by helper: Front perineal area;Buttocks   Assist Level: Moderate Assistance - Patient 50 - 74%    Upper Body Dressing(including orthotics)   What is the patient wearing?: Pull over shirt;Bra   Assist Level: Minimal Assistance - Patient > 75%    Lower Body Dressing (excluding footwear)   What is the patient wearing?: Underwear/pull up;Pants Assist for lower body dressing: Moderate Assistance - Patient 50 - 74%    Putting on/Taking off footwear   What is the patient wearing?: Non-skid slipper socks;Socks;Shoes Assist for footwear: Moderate Assistance - Patient 50 - 74%       Care Tool Toileting Toileting activity   Assist for toileting: Moderate Assistance - Patient 50 - 74%     Care Tool Bed Mobility Roll left and right activity   Roll left and right assist level: Supervision/Verbal cueing    Sit to lying activity   Sit to lying assist level: Contact Guard/Touching assist    Lying to sitting edge of bed activity   Lying to sitting edge of bed assist level: Minimal Assistance - Patient > 75%     Care Tool Transfers Sit to stand transfer   Sit to stand assist level: Moderate Assistance - Patient 50 - 74%    Chair/bed transfer   Chair/bed transfer assist level: Moderate Assistance - Patient 50 - 74% (stand pivot)     Toilet transfer   Assist Level: Moderate Assistance - Patient 50 - 74%     Care Tool Cognition Expression of Ideas and Wants Expression of Ideas and Wants: Some difficulty - exhibits some difficulty with expressing needs and ideas (e.g, some words or  finishing thoughts) or speech is not clear   Understanding Verbal and Non-Verbal Content Understanding Verbal and Non-Verbal Content: Usually understands - understands most conversations, but misses some part/intent of message. Requires cues at times to understand   Memory/Recall Ability *first 3 days only Memory/Recall Ability *first 3 days only: That he or she is in a hospital/hospital unit    Refer to Care Plan for [facility] OT Short Term Goal 1 (Week 1): Pt will complete UB dressing with supervision for all aspects. OT Short Term Goal 2 (Week 1): Pt will complete LB bathing at min assist level sit to stand for at least two consecuitve sessions. OT Short Term Goal 3 (Week 1): Pt will complete LB  dressing with min assist sit to stand. OT Short Term Goal 4 (Week 1): Pt will complete toilet transfers with use of the RW and 3:1 with min assist.  Recommendations for other services: Therapeutic Recreation  Stress management   Skilled Therapeutic Intervention ADL ADL Eating: Set up Where Assessed-Eating: Chair Grooming: Supervision/safety Where Assessed-Grooming: Edge of bed Upper Body Bathing: Supervision/safety Where Assessed-Upper Body Bathing: Edge of bed Lower Body Bathing: Moderate assistance Where Assessed-Lower Body Bathing: Edge of bed Upper Body Dressing: Minimal assistance Where Assessed-Upper Body Dressing: Edge of bed Lower Body Dressing: Moderate assistance Where Assessed-Lower Body Dressing: Edge of bed Toileting: Moderate assistance Where Assessed-Toileting: Bedside Commode Toilet Transfer: Moderate assistance Toilet Transfer Method: Stand pivot Science writer: Radiographer, therapeutic: Not assessed Social research officer, government: Not assessed Mobility  Bed Mobility Bed Mobility: Supine to Sit Rolling Right: Supervision/verbal cueing Rolling Left: Supervision/Verbal cueing Supine to Sit: Minimal Assistance - Patient  > 75% Sit to Supine: Contact Guard/Touching assist Transfers Sit to Stand: Moderate Assistance - Patient 50-74% Stand to Sit: Minimal Assistance - Patient > 75%  Session Note:  Pt seen for session with B/D completed EOB with daughters present.  They report recent change in the last few weeks with her cognition compared to baseline.  She was able to complete bathing and dressing at overall mod assist for sit to stand and standing balance.  Decreased efficiency when trying to fasten her bra, with pt having it twisted initially and her daughter having to fix it for her.  She also demonstrated decreased efficiency with tying them.  Increased LLE weakness noted with mobility as she could not efficiently advance the LLE when walking to the door of her room and back.  Max assist needed for balance.  She reported initial dizziness with supine to sit which lasted for at least one minute or more and then resolved.  Slight dizziness with sit to stand as well, but only lasting for a few seconds.  Finished session with pt in the recliner and with the safety alarm in place and with the call button and phone in reach.    Discharge Criteria: Patient will be discharged from OT if patient refuses treatment 3 consecutive times without medical reason, if treatment goals not met, if there is a change in medical status, if patient makes no progress towards goals or if patient is discharged from hospital.  The above assessment, treatment plan, treatment alternatives and goals were discussed and mutually agreed upon: by patient and by family  Cydnee Fuquay OTR/L 10/27/2020, 4:41 PM

## 2020-10-27 NOTE — Progress Notes (Signed)
PROGRESS NOTE   Subjective/Complaints: Pt has no complaints- ate <50% breakfast-  Slept well- ready for therapy today.  Denies pain.    ROS:  Pt denies SOB, abd pain, CP, N/V/C/D, and vision changes   Objective:   No results found. Recent Labs    10/26/20 1626  WBC 5.9  HGB 12.6  HCT 37.3  PLT 238   Recent Labs    10/26/20 1626  CREATININE 0.82    Intake/Output Summary (Last 24 hours) at 10/27/2020 2019 Last data filed at 10/27/2020 1725 Gross per 24 hour  Intake 270 ml  Output --  Net 270 ml        Physical Exam: Vital Signs Blood pressure 102/62, pulse (!) 56, temperature 98.2 F (36.8 C), resp. rate 18, height 5\' 4"  (1.626 m), weight 55.8 kg, SpO2 96 %.   General: awake, alert, appropriate, sitting up in bed - <50% tray eaten; NAD HENT: conjugate gaze; oropharynx moist CV: bradycardic rate; no JVD Pulmonary: CTA B/L; no W/R/R- good air movement GI: soft, NT, ND, (+)BS Psychiatric: appropriate; flat Neurological: alert  Musculoskeletal:        General: No deformity.    Cervical back: Neck supple.    Right lower leg: No edema.    Left lower leg: No edema. Skin:    General: Skin is warm and dry. Neurological:    Mental Status: She is alert and oriented to person, place, and time.    Motor: Weakness present.    Coordination: Coordination abnormal. Finger-Nose-Finger Test abnormal and Heel to [facility] Test abnormal.    Gait: Gait abnormal.    Comments: Motor 4/5 left delt bi, trik grip HF, KE, ADF Left UE ataxia    Assessment/Plan: 1. Functional deficits which require 3+ hours per day of interdisciplinary therapy in a comprehensive inpatient rehab setting. Physiatrist is providing close team supervision and 24 hour management of active medical problems listed below. Physiatrist and rehab team continue to assess barriers to discharge/monitor patient progress toward functional and medical  goals  Care Tool:  Bathing    Body parts bathed by patient: Right arm, Left arm, Chest, Abdomen, Right upper leg, Left upper leg, Right lower leg, Left lower leg, Face   Body parts bathed by helper: Front perineal area, Buttocks     Bathing assist Assist Level: Moderate Assistance - Patient 50 - 74%     Upper Body Dressing/Undressing Upper body dressing   What is the patient wearing?: Pull over shirt, Bra    Upper body assist Assist Level: Minimal Assistance - Patient > 75%    Lower Body Dressing/Undressing Lower body dressing      What is the patient wearing?: Underwear/pull up, Pants     Lower body assist Assist for lower body dressing: Moderate Assistance - Patient 50 - 74%     Toileting Toileting    Toileting assist Assist for toileting: Moderate Assistance - Patient 50 - 74%     Transfers Chair/bed transfer  Transfers assist     Chair/bed transfer assist level: Moderate Assistance - Patient 50 - 74% (stand pivot)     Locomotion Ambulation   Ambulation assist  Assist level: Maximal Assistance - Patient 25 - 49% Assistive device: No Device Max distance: 15'   Walk 10 feet activity   Assist     Assist level: Moderate Assistance - Patient - 50 - 74% Assistive device: Walker-rolling   Walk 50 feet activity   Assist Walk 50 feet with 2 turns activity did not occur: Safety/medical concerns         Walk 150 feet activity   Assist Walk 150 feet activity did not occur: Safety/medical concerns         Walk 10 feet on uneven surface  activity   Assist Walk 10 feet on uneven surfaces activity did not occur: Safety/medical concerns         Wheelchair     Assist Will patient use wheelchair at discharge?: No             Wheelchair 50 feet with 2 turns activity    Assist            Wheelchair 150 feet activity     Assist          Blood pressure 102/62, pulse (!) 56, temperature 98.2 F (36.8 C),  resp. rate 18, height 5\' 4"  (1.626 m), weight 55.8 kg, SpO2 96 %.  Medical Problem List and Plan: 1.  Decrease self care and mobility  secondary to Right subcortical infarct             -patient may  shower             -ELOS/Goals: 12-14d sup goals F/u neuro 4 wks post rehab dc  First day of evaluations- con't PT, OT and SLP 2.  Antithrombotics: -DVT/anticoagulation:  Pharmaceutical: Lovenox             -antiplatelet therapy: ASA 81mg , and clopidigrel75mg  x 3 wk then ASA alone 3. Pain Management: Tylenol prn 4. Mood:Sertraline 100mg  daily             -antipsychotic agents: none 5. Neuropsych: This patient is capable of making decisions on her own behalf would need family assistance for more complex financial and medical decisions. 6. Skin/Wound Care: monitor 7. Fluids/Electrolytes/Nutrition: low sodium HDPD 8.  HTN lisinopril 10mg   6/20- BP well cotrolled- slightly bradycardic- will con't to monitor 9.  Mild dementia Galantamine 8mg  per day 10.  HLD simvastatin 40mg  daily 11.  Vit D deficiency Ergocalciferol 1.25 mg daily 12.  B12 supplemkent 526mcg daily 13. Constipation colace 1 po qd  6/20- bowels working well per pt- con't regimen        LOS: 1 days A FACE TO FACE EVALUATION WAS PERFORMED  Shaina Gullatt 10/27/2020, 8:19 PM

## 2020-10-27 NOTE — Progress Notes (Signed)
Inpatient Rehabilitation  Patient information reviewed and entered into eRehab system by Dalyn Kjos Stpehen Petitjean, OTR/L.   Information including medical coding, functional ability and quality indicators will be reviewed and updated through discharge.    

## 2020-10-27 NOTE — Plan of Care (Signed)
  Problem: RH Balance Goal: LTG Patient will maintain dynamic sitting balance (PT) Description: LTG:  Patient will maintain dynamic sitting balance with assistance during mobility activities (PT) Flowsheets (Taken 10/27/2020 1418) LTG: Pt will maintain dynamic sitting balance during mobility activities with:: Supervision/Verbal cueing Goal: LTG Patient will maintain dynamic standing balance (PT) Description: LTG:  Patient will maintain dynamic standing balance with assistance during mobility activities (PT) Flowsheets (Taken 10/27/2020 1418) LTG: Pt will maintain dynamic standing balance during mobility activities with:: Supervision/Verbal cueing   Problem: Sit to Stand Goal: LTG:  Patient will perform sit to stand with assistance level (PT) Description: LTG:  Patient will perform sit to stand with assistance level (PT) Flowsheets (Taken 10/27/2020 1418) LTG: PT will perform sit to stand in preparation for functional mobility with assistance level: Supervision/Verbal cueing   Problem: RH Bed Mobility Goal: LTG Patient will perform bed mobility with assist (PT) Description: LTG: Patient will perform bed mobility with assistance, with/without cues (PT). Flowsheets (Taken 10/27/2020 1418) LTG: Pt will perform bed mobility with assistance level of: Independent with assistive device    Problem: RH Bed to Chair Transfers Goal: LTG Patient will perform bed/chair transfers w/assist (PT) Description: LTG: Patient will perform bed to chair transfers with assistance (PT). Flowsheets (Taken 10/27/2020 1418) LTG: Pt will perform Bed to Chair Transfers with assistance level: Supervision/Verbal cueing   Problem: RH Car Transfers Goal: LTG Patient will perform car transfers with assist (PT) Description: LTG: Patient will perform car transfers with assistance (PT). Flowsheets (Taken 10/27/2020 1418) LTG: Pt will perform car transfers with assist:: Supervision/Verbal cueing   Problem: RH Ambulation Goal:  LTG Patient will ambulate in controlled environment (PT) Description: LTG: Patient will ambulate in a controlled environment, # of feet with assistance (PT). Flowsheets (Taken 10/27/2020 1418) LTG: Pt will ambulate in controlled environ  assist needed:: Supervision/Verbal cueing LTG: Ambulation distance in controlled environment: 50 Goal: LTG Patient will ambulate in home environment (PT) Description: LTG: Patient will ambulate in home environment, # of feet with assistance (PT). Flowsheets (Taken 10/27/2020 1418) LTG: Pt will ambulate in home environ  assist needed:: Supervision/Verbal cueing LTG: Ambulation distance in home environment: 50

## 2020-10-27 NOTE — Evaluation (Signed)
Physical Therapy Assessment and Plan  Patient Details  Name: Pamela Mason MRN: [id number] Date of Birth: Sep 27, 1945  PT Diagnosis: Abnormal posture, Abnormality of gait, Ataxia, Ataxic gait, Cognitive deficits, Difficulty walking, Hemiparesis non-dominant, Impaired cognition, and Muscle weakness Rehab Potential: Good ELOS: ~2 weeks   Today's Date: 10/27/2020 PT Individual Time: 1330-1414 PT Individual Time Calculation (min): 44 min    Hospital Problem: Principal Problem:   Subcortical infarction [facility])   Past Medical History:  Past Medical History:  Diagnosis Date   Anxiety    Arthritis    Complication of anesthesia    slow to wake up   Dementia ([place]) 2018   memory loss   Depression    Hyperlipidemia    Hypertension    Osteopenia    Sleep apnea    Past Surgical History:  Past Surgical History:  Procedure Laterality Date   COLONOSCOPY     INTRACAPSULAR CATARACT EXTRACTION     LOOP RECORDER INSERTION N/A 10/24/2020   Procedure: LOOP RECORDER INSERTION;  Surgeon: Constance Haw, MD;  Location: [facility];  Service: Cardiovascular;  Laterality: N/A;   ROTATOR CUFF REPAIR     TOTAL HIP ARTHROPLASTY Left 01/02/2019   Procedure: TOTAL HIP ARTHROPLASTY ANTERIOR APPROACH;  Surgeon: Paralee Cancel, MD;  Location: [facility];  Service: Orthopedics;  Laterality: Left;  70 mins   TUBAL LIGATION      Assessment & Plan Clinical Impression: [name] with medical hx significant for: mild dementia, essential HTN, sleep apnea, HLD. Pt presented to ED on 10/23/20 d/t acute left- sided weakness and dizziness. Pt was not a candidate for tPA d/t initial CT showing chronic right MCA frontal infarct. Pt was preparing to be discharged home when she had recurrent left-sided weakness, left limb ataxia, and left neglect. Code stroke activated. CTA was negative for intracranial LVO. MRI revealed small acute infarct in right superior insula and corona radiata and  small infarcts in right occipital lobe. Echo showed EF of 55-60%.  Pt and pt's daughter reported heart palpitation and irregular heartbeat.  Loop recorder inserted on 10/24/20.  Therapy evaluations completed and CIR recommended d/t pt's deficits in mobility, balance, cognition, and ability to complete ADLs independently  Patient currently requires mod with mobility secondary to muscle weakness, decreased cardiorespiratoy endurance, impaired timing and sequencing, abnormal tone, unbalanced muscle activation, ataxia, decreased coordination, and decreased motor planning, decreased attention to left, decreased initiation, decreased attention, decreased awareness, decreased problem solving, decreased safety awareness, and decreased memory, and decreased sitting balance, decreased standing balance, decreased postural control, hemiplegia, and decreased balance strategies.  Prior to hospitalization, patient was independent  with mobility and lived with Alone in a Independent living facility (plans to dc to Indep living) home.  Home access is  Level entry (in ILF).  Patient will benefit from skilled PT intervention to maximize safe functional mobility, minimize fall risk, and decrease caregiver burden for planned discharge home with 24 hour supervision.  Anticipate patient will benefit from follow up [place] at discharge.  PT - End of Session Activity Tolerance: Tolerates 30+ min activity with multiple rests Endurance Deficit: Yes PT Assessment Rehab Potential (ACUTE/IP ONLY): Good PT Barriers to Discharge: Decreased caregiver support PT Barriers to Discharge Comments: h/o cog deficits PT Patient demonstrates impairments in the following area(s): Balance;Endurance;Motor;Nutrition;Pain;Perception;Safety;Sensory;Skin Integrity PT Transfers Functional Problem(s): Bed Mobility;Bed to Chair;Car;Furniture PT Locomotion Functional Problem(s): Ambulation;Stairs PT Plan PT Intensity: Minimum of 1-2 x/day ,45 to 90  minutes  PT Frequency: 5 out of 7 days PT Duration Estimated Length of Stay: ~2 weeks PT Treatment/Interventions: Ambulation/gait training;DME/adaptive equipment instruction;Psychosocial support;UE/LE Strength taining/ROM;Balance/vestibular training;Functional electrical stimulation;Skin care/wound management;UE/LE Coordination activities;Cognitive remediation/compensation;Functional mobility training;Splinting/orthotics;Visual/perceptual remediation/compensation;Community reintegration;Neuromuscular re-education;Stair training;Wheelchair propulsion/positioning;Discharge planning;Pain management;Therapeutic Activities;Disease management/prevention;Patient/family education;Therapeutic Exercise PT Transfers Anticipated Outcome(s): spv PT Locomotion Anticipated Outcome(s): spv PT Recommendation Recommendations for Other Services: Therapeutic Recreation consult Therapeutic Recreation Interventions: Stress management Follow Up Recommendations: Home health PT;24 hour supervision/assistance Patient destination: Home (IRF) Equipment Recommended: To be determined Equipment Details: owns RW   PT Evaluation Precautions/Restrictions Precautions Precautions: Fall Precaution Comments: ataxia Restrictions Weight Bearing Restrictions: No  Home Living/Prior Functioning Home Living Available Help at Discharge: Family;Available PRN/intermittently Type of Home: Independent living facility (plans to dc to Indep living) Home Access: Level entry (in ILF) Bathroom Toilet: Handicapped height Bathroom Accessibility: Yes  Lives With: Alone Prior Function Level of Independence: Independent with basic ADLs;Independent with homemaking with ambulation;Independent with gait;Independent with transfers  Able to Take Stairs?: Yes Driving: Yes Comments: Daughter recently living with patient. Plan was for patient to move into ILF at [facility] soon. Hx of dementia. Daughter reports STM deficits are  worsening Vision/Perception  Perception Perception: Impaired Inattention/Neglect: Does not attend to left visual field Praxis Praxis: Impaired Praxis Impairment Details: Motor planning;Initiation  Cognition Overall Cognitive Status: History of cognitive impairments - at baseline Orientation Level: Oriented X4 Attention: Sustained Sustained Attention: Impaired Sustained Attention Impairment: Verbal basic;Functional basic Memory: Impaired Memory Impairment: Storage deficit;Decreased recall of new information;Retrieval deficit Awareness: Impaired Awareness Impairment: Emergent impairment;Anticipatory impairment Problem Solving: Impaired Problem Solving Impairment: Functional basic;Verbal basic Safety/Judgment: Impaired Sensation Sensation Light Touch: Appears Intact Hot/Cold: Not tested Proprioception: Impaired by gross assessment Stereognosis: Appears Intact Coordination Gross Motor Movements are Fluid and Coordinated: No Fine Motor Movements are Fluid and Coordinated: No Motor  Motor Motor: Hemiplegia;Abnormal tone;Ataxia;Abnormal postural alignment and control Motor - Skilled Clinical Observations: Slight ataxia present  L LE   Trunk/Postural Assessment  Cervical Assessment Cervical Assessment: Within Functional Limits Thoracic Assessment Thoracic Assessment: Exceptions to [facility] Lumbar Assessment Lumbar Assessment: Exceptions to [facility] Postural Control Postural Control: Deficits on evaluation Righting Reactions: delayed and inadequate Protective Responses: delayed and inadequate  Balance Balance Balance Assessed: Yes Standardized Balance Assessment Standardized Balance Assessment: Timed Up and Go Test Timed Up and Go Test TUG: Normal TUG Normal TUG (seconds): 62.64 Static Sitting Balance Static Sitting - Balance Support: Feet supported Static Sitting - Level of Assistance: 5: Stand by assistance Dynamic Sitting Balance Dynamic Sitting - Balance Support: Feet  supported;During functional activity Dynamic Sitting - Level of Assistance:  (CGA) Static Standing Balance Static Standing - Balance Support: Bilateral upper extremity supported Static Standing - Level of Assistance: 4: Min assist Dynamic Standing Balance Dynamic Standing - Balance Support: Bilateral upper extremity supported;During functional activity Dynamic Standing - Level of Assistance: 3: Mod assist Extremity Assessment      RLE Assessment RLE Assessment: Exceptions to [facility] General Strength Comments: grossly 4/5 LLE Assessment LLE Assessment: Exceptions to [facility] LLE Strength Left Hip Flexion: 3+/5 Left Hip ABduction: 4-/5 Left Hip ADduction: 4-/5 Left Knee Flexion: 3+/5 Left Knee Extension: 3+/5 Left Ankle Dorsiflexion: 3-/5 Left Ankle Plantar Flexion: 3/5  Care Tool Care Tool Bed Mobility Roll left and right activity   Roll left and right assist level: Supervision/Verbal cueing    Sit to lying activity   Sit to lying assist level: Contact Guard/Touching assist    Lying to sitting edge of bed activity   Lying to sitting edge  of bed assist level: Contact Guard/Touching assist     Care Tool Transfers Sit to stand transfer   Sit to stand assist level: Minimal Assistance - Patient > 75%    Chair/bed transfer   Chair/bed transfer assist level: Moderate Assistance - Patient 50 - 74%     Toilet transfer   Assist Level: Moderate Assistance - Patient 50 - 74%    Car transfer   Car transfer assist level: Moderate Assistance - Patient 50 - 74%      Care Tool Locomotion Ambulation   Assist level: Moderate Assistance - Patient 50 - 74% Assistive device: Walker-rolling Max distance: 20  Walk 10 feet activity   Assist level: Moderate Assistance - Patient - 50 - 74% Assistive device: Walker-rolling   Walk 50 feet with 2 turns activity Walk 50 feet with 2 turns activity did not occur: Safety/medical concerns      Walk 150 feet activity Walk 150 feet activity did not  occur: Safety/medical concerns      Walk 10 feet on uneven surfaces activity Walk 10 feet on uneven surfaces activity did not occur: Safety/medical concerns      Stairs   Assist level: Minimal Assistance - Patient > 75% Stairs assistive device: 2 hand rails Max number of stairs: 2  Walk up/down 1 step activity   Walk up/down 1 step (curb) assist level: Minimal Assistance - Patient > 75% Walk up/down 1 step or curb assistive device: 2 hand rails Walk up/down 4 steps activity did not occuR: Safety/medical concerns  Walk up/down 4 steps activity      Walk up/down 12 steps activity Walk up/down 12 steps activity did not occur: Safety/medical concerns      Pick up small objects from floor Pick up small object from the floor (from standing position) activity did not occur: Safety/medical concerns      Wheelchair Will patient use wheelchair at discharge?: No          Wheel 50 feet with 2 turns activity      Wheel 150 feet activity        Refer to Care Plan for Long Term Goals  SHORT TERM GOAL WEEK 1 PT Short Term Goal 1 (Week 1): Patient will complete sit <> stand with LRAD and CGA PT Short Term Goal 2 (Week 1): Patient will completed bed<> wc transfer with LRAD and MinA PT Short Term Goal 3 (Week 1): Patient will ambulate >74f with LRAD and MinA  Recommendations for other services: Therapeutic Recreation  Stress management  Skilled Therapeutic Intervention Mobility Bed Mobility Bed Mobility: Rolling Right;Rolling Left;Supine to Sit;Sit to Supine Rolling Right: Supervision/verbal cueing Rolling Left: Supervision/Verbal cueing Supine to Sit: Contact Guard/Touching assist Sit to Supine: Contact Guard/Touching assist Transfers Transfers: Sit to Stand;Stand to Sit;Stand Pivot Transfers Sit to Stand: Minimal Assistance - Patient > 75% Stand to Sit: Minimal Assistance - Patient > 75% Stand Pivot Transfers: Moderate Assistance - Patient 50 - 74% Stand Pivot Transfer Details:  Manual facilitation for weight shifting;Verbal cues for safe use of DME/AE;Verbal cues for gait pattern;Verbal cues for precautions/safety Transfer (Assistive device): Rolling walker Locomotion  Gait Ambulation: Yes Gait Assistance: Moderate Assistance - Patient 50-74% Gait Distance (Feet): 20 Feet Assistive device: Rolling walker Gait Assistance Details: Manual facilitation for weight shifting;Verbal cues for safe use of DME/AE;Verbal cues for gait pattern;Verbal cues for precautions/safety Gait Gait: Yes Gait Pattern: Impaired Gait Pattern: Ataxic;Scissoring;Step-through pattern;Step-to pattern;Decreased stance time - left;Decreased step length - left;Left foot flat;Narrow base of  support;Trunk flexed Gait velocity: decreased Stairs / Additional Locomotion Stairs: Yes Stairs Assistance: Minimal Assistance - Patient > 75% Stair Management Technique: Two rails Number of Stairs: 2 Height of Stairs: 6 Wheelchair Mobility Wheelchair Mobility: No   Discharge Criteria: Patient will be discharged from PT if patient refuses treatment 3 consecutive times without medical reason, if treatment goals not met, if there is a change in medical status, if patient makes no progress towards goals or if patient is discharged from hospital.  The above assessment, treatment plan, treatment alternatives and goals were discussed and mutually agreed upon: by patient and by family  Debbora Dus 10/27/2020, 2:16 PM

## 2020-10-27 NOTE — Progress Notes (Signed)
Patient ID: Pamela Mason, female   DOB: 1945-06-04, 75 y.o.   MRN: [id number]  This SW covering for primary SW, Erlene Quan.  SW made efforts to meet with pt to complete assessment. SW unable to complete due to scheduled therapies. Pt daughters: Selena Batten [phone]) and [name] [phone]) present at this time. SW confirms the plan is for pt to d/c to ILF- [facility]. Dtr [name] reports they met with the ILF yesterday to discuss aide service for pt since they understand she will require 24/7 care. They will hire private aide through [facility] contracted aide service- [facility]. SW informed primary SW will return tomorrow and will follow-up.  Loralee Pacas, MSW, [place] Office: [phone] Cell: [phone] Fax: [phone]

## 2020-10-27 NOTE — Evaluation (Signed)
Speech Language Pathology Assessment and Plan  Patient Details  Name: Pamela Mason MRN: [id number] Date of Birth: 06-14-1945  SLP Diagnosis: Cognitive Impairments  Rehab Potential: Good ELOS: 12-16    Today's Date: 10/27/2020 SLP Individual Time: 8144-8185 SLP Individual Time Calculation (min): 67 min   Hospital Problem: Principal Problem:   Subcortical infarction [facility])  Past Medical History:  Past Medical History:  Diagnosis Date   Anxiety    Arthritis    Complication of anesthesia    slow to wake up   Dementia ([place]) 2018   memory loss   Depression    Hyperlipidemia    Hypertension    Osteopenia    Sleep apnea    Past Surgical History:  Past Surgical History:  Procedure Laterality Date   COLONOSCOPY     INTRACAPSULAR CATARACT EXTRACTION     LOOP RECORDER INSERTION N/A 10/24/2020   Procedure: LOOP RECORDER INSERTION;  Surgeon: Constance Haw, MD;  Location: [facility];  Service: Cardiovascular;  Laterality: N/A;   ROTATOR CUFF REPAIR     TOTAL HIP ARTHROPLASTY Left 01/02/2019   Procedure: TOTAL HIP ARTHROPLASTY ANTERIOR APPROACH;  Surgeon: Paralee Cancel, MD;  Location: [facility];  Service: Orthopedics;  Laterality: Left;  70 mins   TUBAL LIGATION      Assessment / Plan / Recommendation Clinical Impression  Pt is a 75 year old female with medical hx significant for: mild dementia, essential HTN, sleep apnea, HLD. Pt presented to ED on 10/23/20 d/t acute left- sided weakness and dizziness. Pt was not a candidate for tPA d/t initial CT showing chronic right MCA frontal infarct. Pt was preparing to be discharged home when she had recurrent left-sided weakness, left limb ataxia, and left neglect. Code stroke activated. CTA was negative for intracranial LVO. MRI revealed small acute infarct in right superior insula and corona radiata and small infarcts in right occipital lobe. Echo showed EF of 55-60%.  Pt and pt's daughter reported heart palpitation and irregular  heartbeat.  Loop recorder inserted on 10/24/20.  Therapy evaluations completed and CIR recommended d/t pt's deficits in mobility, balance, cognition, and ability to complete ADLs independently.  Pt presents with severe deficits in short term recall and moderate deficits in problem solving, emergent awareness and mild sustained attention deficits. Pt's impairments where further supported by a score of 12 out 30 (n=>27) on SLUMS, however pt does have a h/o of memory deficits/mild dementia and formal test relays heavily on short term recall in tasks. Pt was able to organize clock numbers and demonstrated mental math accurately, but was unable to set clock time to demonstrate basic problem solving skills. Pt was able to complete 2 out 3 construction designs from [place] with extra time, indicating ability to complete mildly complex problem solving and error awareness without ability to correct. Pt was completing mildly complex medication/time/money management with supervision-min A from family and was in transition to move to ILF prior to acute CVA. Pt would benefit from skilled ST Mason in order to maximize functional independence and reduce burden of care, requiring supervision at discharge with continued skilled ST Mason.   Skilled Therapeutic Interventions          Skilled ST Mason focused on cognitive skills. SLP facilitated administration of cognitive linguistic formal assessment and provided education of results. SLP and pt collaborated to set goals for cognitive linguistic needs during length of stay. SLP provided memory notebook and education on use during stay in CIR. All questions  answered to satisfaction.  Pt was left in room with family, call bell within reach and bed alarm set. SLP recommends to continue skilled Mason.    SLP Assessment  Patient will need skilled Pamela Mason during CIR admission    Recommendations  Patient destination: Assisted  Living (ILF) Follow up Recommendations: 24 hour supervision/assistance;Home Health SLP Equipment Recommended: None recommended by SLP    SLP Frequency 3 to 5 out of 7 days   SLP Duration  SLP Intensity  SLP Treatment/Interventions 12-16  Minumum of 1-2 x/day, 30 to 90 minutes  Cognitive remediation/compensation;Cueing hierarchy;Functional tasks;Medication managment;Internal/external aids;Patient/family education    Pain Pain Assessment Pain Score: 0-No pain  Prior Functioning Cognitive/Linguistic Baseline: Baseline deficits Baseline deficit details: STM, mild dementia Type of Home: Independent living facility Available Help at Discharge: Family;Available PRN/intermittently  SLP Evaluation Cognition Overall Cognitive Status: History of cognitive impairments - at baseline Orientation Level: Oriented X4 Attention: Sustained Sustained Attention: Impaired Sustained Attention Impairment: Verbal basic;Functional basic Memory: Impaired Memory Impairment: Storage deficit;Decreased recall of new information;Retrieval deficit Awareness: Impaired Awareness Impairment: Emergent impairment;Anticipatory impairment Problem Solving: Impaired Problem Solving Impairment: Functional basic;Verbal basic Safety/Judgment: Impaired  Comprehension Auditory Comprehension Overall Auditory Comprehension: Impaired Yes/No Questions: Within Functional Limits Commands: Impaired (impacted  by memory deficits) Conversation: Simple Interfering Components: Attention;Processing speed EffectiveTechniques: Extra processing time Visual Recognition/Discrimination Discrimination: Within Function Limits Reading Comprehension Reading Status: Not tested Expression Expression Primary Mode of Expression: Verbal Verbal Expression Overall Verbal Expression: Impaired Initiation: No impairment Level of Generative/Spontaneous Verbalization: Sentence Naming: Impairment Divergent: 25-49% accurate Pragmatics: No  impairment Interfering Components: Attention Oral Motor Oral Motor/Sensory Function Overall Oral Motor/Sensory Function: Within functional limits Motor Speech Overall Motor Speech: Appears within functional limits for tasks assessed  Care Tool Care Tool Cognition Expression of Ideas and Wants Expression of Ideas and Wants: Some difficulty - exhibits some difficulty with expressing needs and ideas (e.g, some words or finishing thoughts) or speech is not clear   Understanding Verbal and Non-Verbal Content Understanding Verbal and Non-Verbal Content: Usually understands - understands most conversations, but misses some part/intent of message. Requires cues at times to understand   Memory/Recall Ability *first 3 days only        Short Term Goals: Week 1: SLP Short Term Goal 1 (Week 1): Pt will demonstrate susatained attention to functional task in 15 minute intervals with supervision A verbal cues. SLP Short Term Goal 2 (Week 1): Pt will demonstrate recall of novel, daily information with mod A verbal cues for use of visual aids. SLP Short Term Goal 3 (Week 1): Pt will demonstrate basic-mildly complex problem solving skills with mod A verbal cues. SLP Short Term Goal 4 (Week 1): Pt will self-monitor and self-correct functional errors with min A verbal cues.  Refer to Care Plan for Long Term Goals  Recommendations for other Mason: None   Discharge Criteria: Patient will be discharged from SLP if patient refuses treatment 3 consecutive times without medical reason, if treatment goals not met, if there is a change in medical status, if patient makes no progress towards goals or if patient is discharged from hospital.  The above assessment, treatment plan, treatment alternatives and goals were discussed and mutually agreed upon: by patient and by family    [facility] 10/27/2020, 12:59 PM

## 2020-10-28 MED ORDER — CLOPIDOGREL BISULFATE 75 MG PO TABS
75.0000 mg | ORAL_TABLET | Freq: Every day | ORAL | Status: DC
  Administered 2020-10-28 – 2020-11-12 (×16): 75 mg via ORAL
  Filled 2020-10-28 (×16): qty 1

## 2020-10-28 NOTE — Progress Notes (Signed)
PROGRESS NOTE   Subjective/Complaints:  No issues overnite, denies pain, sleeping ok  ROS:  Pt denies SOB, abd pain, CP, N/V/C/D, and vision changes   Objective:   No results found. Recent Labs    10/26/20 1626  WBC 5.9  HGB 12.6  HCT 37.3  PLT 238    Recent Labs    10/26/20 1626  CREATININE 0.82     Intake/Output Summary (Last 24 hours) at 10/28/2020 0802 Last data filed at 10/27/2020 1725 Gross per 24 hour  Intake 240 ml  Output --  Net 240 ml         Physical Exam: Vital Signs Blood pressure 111/70, pulse (!) 56, temperature 98.2 F (36.8 C), resp. rate 17, height 5\' 4"  (1.626 m), weight 55.8 kg, SpO2 96 %.   General: No acute distress Mood and affect are appropriate Heart: Regular rate and rhythm no rubs murmurs or extra sounds Lungs: Clear to auscultation, breathing unlabored, no rales or wheezes Abdomen: Positive bowel sounds, soft nontender to palpation, nondistended Extremities: No clubbing, cyanosis, or edema Skin: No evidence of breakdown, no evidence of rash  Musculoskeletal:        General: No deformity.    Cervical back: Neck supple.    Right lower leg: No edema.    Left lower leg: No edema. Skin:    General: Skin is warm and dry. Neurological:    Mental Status: She is alert and oriented to person, place, and time.    Motor: Weakness present.    Coordination: Coordination abnormal. Finger-Nose-Finger Test abnormal and Heel to [facility] Test abnormal.    Gait: Gait abnormal.    Comments: Motor 4/5 left delt bi, trik grip HF, KE, ADF Left UE ataxia    Assessment/Plan: 1. Functional deficits which require 3+ hours per day of interdisciplinary therapy in a comprehensive inpatient rehab setting. Physiatrist is providing close team supervision and 24 hour management of active medical problems listed below. Physiatrist and rehab team continue to assess barriers to discharge/monitor  patient progress toward functional and medical goals  Care Tool:  Bathing    Body parts bathed by patient: Right arm, Left arm, Chest, Abdomen, Right upper leg, Left upper leg, Right lower leg, Left lower leg, Face   Body parts bathed by helper: Front perineal area, Buttocks     Bathing assist Assist Level: Moderate Assistance - Patient 50 - 74%     Upper Body Dressing/Undressing Upper body dressing   What is the patient wearing?: Pull over shirt, Bra    Upper body assist Assist Level: Minimal Assistance - Patient > 75%    Lower Body Dressing/Undressing Lower body dressing      What is the patient wearing?: Underwear/pull up, Pants     Lower body assist Assist for lower body dressing: Moderate Assistance - Patient 50 - 74%     Toileting Toileting    Toileting assist Assist for toileting: Moderate Assistance - Patient 50 - 74%     Transfers Chair/bed transfer  Transfers assist     Chair/bed transfer assist level: Moderate Assistance - Patient 50 - 74% (stand pivot)     Locomotion Ambulation   Ambulation assist  Assist level: Maximal Assistance - Patient 25 - 49% Assistive device: No Device Max distance: 15'   Walk 10 feet activity   Assist     Assist level: Moderate Assistance - Patient - 50 - 74% Assistive device: Walker-rolling   Walk 50 feet activity   Assist Walk 50 feet with 2 turns activity did not occur: Safety/medical concerns         Walk 150 feet activity   Assist Walk 150 feet activity did not occur: Safety/medical concerns         Walk 10 feet on uneven surface  activity   Assist Walk 10 feet on uneven surfaces activity did not occur: Safety/medical concerns         Wheelchair     Assist Will patient use wheelchair at discharge?: No             Wheelchair 50 feet with 2 turns activity    Assist            Wheelchair 150 feet activity     Assist          Blood pressure  111/70, pulse (!) 56, temperature 98.2 F (36.8 C), resp. rate 17, height 5\' 4"  (1.626 m), weight 55.8 kg, SpO2 96 %.  Medical Problem List and Plan: 1.  Decrease self care and mobility  secondary to Right subcortical infarct             -patient may  shower             -ELOS/Goals: 12-14d sup goals F/u neuro 4 wks post rehab dc  First day of evaluations- con't PT, OT and SLP 2.  Antithrombotics: -DVT/anticoagulation:  Pharmaceutical: Lovenox             -antiplatelet therapy: ASA 81mg , and clopidigrel75mg  x 3 wk then plavix alone 3. Pain Management: Tylenol prn 4. Mood:Sertraline 100mg  daily             -antipsychotic agents: none 5. Neuropsych: This patient is capable of making decisions on her own behalf would need family assistance for more complex financial and medical decisions. 6. Skin/Wound Care: monitor 7. Fluids/Electrolytes/Nutrition: low sodium HDPD 8.  HTN lisinopril 10mg   6/20- BP well cotrolled- slightly bradycardic- will con't to monitor 9.  Mild dementia Galantamine 8mg  per day 10.  HLD simvastatin 40mg  daily 11.  Vit D deficiency Ergocalciferol 1.25 mg daily 12.  B12 supplemkent 519mcg daily 13. Constipation colace 1 po qd  6/20- bowels working well per pt- con't regimen        LOS: 2 days A FACE TO [facility] [place] [facility] 10/28/2020, 8:02 AM

## 2020-10-28 NOTE — Progress Notes (Signed)
Speech Language Pathology Daily Session Note  Patient Details  Name: Pamela Mason MRN: [id number] Date of Birth: 1945-09-03  Today's Date: 10/28/2020 SLP Individual Time: 1050-1130 SLP Individual Time Calculation (min): 40 min  Short Term Goals: Week 1: SLP Short Term Goal 1 (Week 1): Pt will demonstrate susatained attention to functional task in 15 minute intervals with supervision A verbal cues. SLP Short Term Goal 2 (Week 1): Pt will demonstrate recall of novel, daily information with mod A verbal cues for use of visual aids. SLP Short Term Goal 3 (Week 1): Pt will demonstrate basic-mildly complex problem solving skills with mod A verbal cues. SLP Short Term Goal 4 (Week 1): Pt will self-monitor and self-correct functional errors with min A verbal cues.  Skilled Therapeutic Interventions:Skilled ST services focused on cognitive skills. SLP facilitated mildly complex problem solving, error awareness, sustained attention and recall within task utilizing 4-6 step ADL/IADL picture card tasks. Pt required min A fade to supervision A verbal cues for problem solving and error awareness. Pt demonstrated sustained attention in 30 minute intervals mod I. Pt was able to recall OT/PT events utilizing memory notebook with mod A verbal cues. Pt demonstrated increase in cognitive skills compared to on evaluation and family supports pt's "brian is waking up." SLP will upgrade goals next session if progress continues. Pt was left in room with daughter, call bell within reach and chair alarm set. SLP recommends to continue skilled services.     Pain Pain Assessment Pain Score: 0-No pain  Therapy/Group: Individual Therapy  Ediel Unangst  [facility] 10/28/2020, 11:38 AM

## 2020-10-28 NOTE — Progress Notes (Signed)
Physical Therapy Session Note  Patient Details  Name: Pamela Mason MRN: [id number] Date of Birth: 09-14-1945  Today's Date: 10/28/2020 PT Individual Time: 1340-1405 PT Individual Time Calculation (min): 25 min   Short Term Goals: Week 1:  PT Short Term Goal 1 (Week 1): Patient will complete sit <> stand with LRAD and CGA PT Short Term Goal 2 (Week 1): Patient will completed bed<> wc transfer with LRAD and MinA PT Short Term Goal 3 (Week 1): Patient will ambulate >51ft with LRAD and MinA  Skilled Therapeutic Interventions/Progress Updates:    Pt received sitting EOB with her daughter, Selena Batten, present and pt agreeable to therapy session. L squat pivot EOB>w/c min assist for balance while turning.  Transported to/from gym in w/c for time management and energy conservation. Sit<>stands w/c<>RW with min assist for balance - cuing for pushing up from armrests and not pulling up on AD. Gait training 1104ft using RW with mod assist for balance - noticed slight L LE ankle inversion (no rolling), L LE excessively adducted during swing as well as excessively externally rotated, L lean due to the narrow BOS along with poor proprioceptive awareness of LLE - cuing throughout for improvement and to increase upright trunk posture and forward gaze. Transported back up to 5 [place]. Gait training additional ~88ft using RW with mod assist and continuing with the above gait deviations and continued cuing for improvement. Educated pt/family on plan for team conference tomorrow. Pt left sitting EOB with needs in reach and daughter present - NT arriving to assist with toileting.  Therapy Documentation Precautions:  Precautions Precautions: Fall Precaution Comments: RUE and RLE ataxia Restrictions Weight Bearing Restrictions: No   Pain: No reports of pain throughout session.   Therapy/Group: Individual Therapy  Tawana Scale , PT, DPT, CSRS 10/28/2020, 12:37 PM

## 2020-10-28 NOTE — Progress Notes (Signed)
Physical Therapy Session Note  Patient Details  Name: Pamela Mason MRN: [id number] Date of Birth: Oct 04, 1945  Today's Date: 10/28/2020 PT Individual Time: 1015-1045 PT Individual Time Calculation (min): 30 min   Short Term Goals: Week 1:  PT Short Term Goal 1 (Week 1): Patient will complete sit <> stand with LRAD and CGA PT Short Term Goal 2 (Week 1): Patient will completed bed<> wc transfer with LRAD and MinA PT Short Term Goal 3 (Week 1): Patient will ambulate >52ft with LRAD and MinA  Skilled Therapeutic Interventions/Progress Updates: Pt presents sitting in w/c and agreeable to therapy.  Dtr present so education given for transfers to pt and dtr.  Pt transfers w/c <> bed  and w/c <> recliner w/ min A squat pivot.  Pt requires only occasional verbal cues for safety, especially w/ foot clearance for "stepping" to 2nd surface.  Pt initiates proper sequencing for safe transfer, scooting forward hand placement for transition off of bed and then proper placement on 2nd surface.  Education also given to Dtr 2/2 dx of dementia, as acknowledged by pt and glad for dtr being present to help w/ training.  Pt amb up to 31' including turn to return to seat w/ mod A and RW.  Pt amb w/ decreased BOS, foot placement of LLE and tends to increase step length B LEs.  Pt transferred w/c recliner w/ min A to remain seated for ST next.  Seat alarm on and all needs in reach.     Therapy Documentation Precautions:  Precautions Precautions: Fall Precaution Comments: RUE and RLE ataxia Restrictions Weight Bearing Restrictions: No General:   Vital Signs:  Pain:0/10 Pain Assessment Pain Score: 0-No pain Mobility:       Therapy/Group: Individual Therapy  Ladoris Gene 10/28/2020, 11:50 AM

## 2020-10-28 NOTE — Plan of Care (Signed)
  Problem: Consults Goal: RH GENERAL PATIENT EDUCATION Description: See Patient Education module for education specifics. Outcome: Progressing Goal: Skin Care Protocol Initiated - if Braden Score 18 or less Description: If consults are not indicated, leave blank or document N/A Outcome: Progressing Goal: Nutrition Consult-if indicated Outcome: Progressing Goal: Diabetes Guidelines if Diabetic/Glucose > 140 Description: If diabetic or lab glucose is > 140 mg/dl - Initiate Diabetes/Hyperglycemia Guidelines & Document Interventions  Outcome: Progressing   Problem: RH BOWEL ELIMINATION Goal: RH STG MANAGE BOWEL WITH ASSISTANCE Description: STG Manage Bowel with mod I Assistance. Outcome: Progressing Goal: RH STG MANAGE BOWEL W/MEDICATION W/ASSISTANCE Description: STG Manage Bowel with Medication with mod I  Assistance. Outcome: Progressing   Problem: RH BLADDER ELIMINATION Goal: RH STG MANAGE BLADDER WITH ASSISTANCE Description: STG Manage Bladder With no Assistance Outcome: Progressing   Problem: RH SAFETY Goal: RH STG ADHERE TO SAFETY PRECAUTIONS W/ASSISTANCE/DEVICE Description: STG Adhere to Safety Precautions With cues/reminders Assistance/Device. Outcome: Progressing   Problem: RH KNOWLEDGE DEFICIT Goal: RH STG INCREASE KNOWLEDGE OF HYPERTENSION Description: Patient will be able to manage HTN with medications and dietary modifications using handouts and educational tools with cues/reminders Outcome: Progressing Goal: RH STG INCREASE KNOWLEGDE OF HYPERLIPIDEMIA Description: Patient will be able to manage HLD with medications and dietary modifications using handouts and educational tools with cues/reminders Outcome: Progressing Goal: RH STG INCREASE KNOWLEDGE OF STROKE PROPHYLAXIS Description: Patient will be able to manage secondary stroke risks with medications and dietary modifications using handouts and educational tools with cues/reminders Outcome: Progressing   Problem:  Consults Goal: RH STROKE PATIENT EDUCATION Description: See Patient Education module for education specifics  Outcome: Progressing

## 2020-10-28 NOTE — IPOC Note (Signed)
Overall Plan of Care [facility]) Patient Details Name: Pamela Mason MRN: [id number] DOB: Jan 22, 1946  Admitting Diagnosis: Subcortical infarction [facility])  Hospital Problems: Principal Problem:   Subcortical infarction [facility])     Functional Problem List: Nursing Endurance, Medication Management, Safety  PT Balance, Endurance, Motor, Nutrition, Pain, Perception, Safety, Sensory, Skin Integrity  OT Balance, Cognition, Endurance, Motor, Vision, Water engineer, Safety  SLP Cognition  TR         Basic ADL's: OT Eating, Grooming, Bathing, Dressing, Toileting     Advanced  ADL's: OT       Transfers: PT Bed Mobility, Bed to Chair, Car, Manufacturing systems engineer, Metallurgist: PT Ambulation, Stairs     Additional Impairments: OT Fuctional Use of Upper Extremity  SLP Social Cognition   Problem Solving, Memory, Awareness, Attention  TR      Anticipated Outcomes Item Anticipated Outcome  Self Feeding modified independent  Swallowing      Basic self-care  supervision  Toileting  supervision   Bathroom Transfers supervision  Bowel/Bladder  manage bowel with mod I assist  Transfers  spv  Locomotion  spv  Communication     Cognition  Min-Supervision A  Pain  n/a  Safety/Judgment  maintain safety with cues/reminders   Therapy Plan: PT Intensity: Minimum of 1-2 x/day ,45 to 90 minutes PT Frequency: 5 out of 7 days PT Duration Estimated Length of Stay: ~2 weeks OT Intensity: Minimum of 1-2 x/day, 45 to 90 minutes OT Frequency: 5 out of 7 days OT Duration/Estimated Length of Stay: 14-17 days SLP Intensity: Minumum of 1-2 x/day, 30 to 90 minutes SLP Frequency: 3 to 5 out of 7 days SLP Duration/Estimated Length of Stay: 12-16   Due to the current state of emergency, patients may not be receiving their 3-hours of Medicare-mandated therapy.   Team Interventions: Nursing Interventions Disease Management/Prevention, Medication Management, Discharge Planning, Bowel  Management, Patient/Family Education  PT interventions Ambulation/gait training, DME/adaptive equipment instruction, Psychosocial support, UE/LE Strength taining/ROM, Balance/vestibular training, Functional electrical stimulation, Skin care/wound management, UE/LE Coordination activities, Cognitive remediation/compensation, Functional mobility training, Splinting/orthotics, Visual/perceptual remediation/compensation, Community reintegration, Neuromuscular re-education, IT trainer, Wheelchair propulsion/positioning, Discharge planning, Pain management, Therapeutic Activities, Disease management/prevention, Barrister's clerk education, Therapeutic Exercise  OT Interventions Training and development officer, Cognitive remediation/compensation, Academic librarian, Engineer, drilling, Discharge planning, Disease mangement/prevention, Pain management, Self Care/advanced ADL retraining, Therapeutic Activities, UE/LE Coordination activities, Therapeutic Exercise, Patient/family education, Functional mobility training, Neuromuscular re-education, UE/LE Strength taining/ROM, Visual/perceptual remediation/compensation  SLP Interventions Cognitive remediation/compensation, Cueing hierarchy, Functional tasks, Medication managment, Internal/external aids, Patient/family education  TR Interventions    SW/CM Interventions Discharge Planning, Psychosocial Support, Patient/Family Education   Barriers to Discharge MD  Medical stability and Lack of/limited family support  Nursing Decreased caregiver support, Home environment access/layout    PT Decreased caregiver support h/o cog deficits  OT Decreased caregiver support Will need setup of 24 hr supervision from family  SLP      SW       Team Discharge Planning: Destination: PT-Home (IRF) ,OT- Home , SLP-Assisted Living (ILF) Projected Follow-up: PT-Home health PT, 24 hour supervision/assistance, OT-  24 hour supervision/assistance, Home health OT,  SLP-24 hour supervision/assistance, Home Health SLP Projected Equipment Needs: PT-To be determined, OT- To be determined, SLP-None recommended by SLP Equipment Details: PT-owns RW, OT-  Patient/family involved in discharge planning: PT- Patient, Family member/caregiver,  OT-Family member/caregiver, Patient, SLP-Patient, Family member/caregiver  MD ELOS: 12-14d Medical Rehab Prognosis:  Good Assessment: 75 year old female with  medical hx significant for: mild dementia, essential HTN, sleep apnea, HLD. Pt presented to ED on 10/23/20 d/t acute left- sided weakness and dizziness. Pt was not a candidate for tPA d/t initial CT showing chronic right MCA frontal infarct. Pt was preparing to be discharged home when she had recurrent left-sided weakness, left limb ataxia, and left neglect. Code stroke activated. CTA was negative for intracranial LVO. MRI revealed small acute infarct in right superior insula and corona radiata and small infarcts in right occipital lobe. Echo showed EF of 55-60%.  Pt and pt's daughter reported heart palpitation and irregular heartbeat.  Loop recorder inserted on 10/24/20.  Therapy evaluations completed and CIR recommended d/t pt's deficits in mobility, balance, cognition, and ability to complete ADLs independently    See Team Conference Notes for weekly updates to the plan of care

## 2020-10-28 NOTE — Progress Notes (Signed)
Occupational Therapy Session Note  Patient Details  Name: Pamela Mason MRN: [id number] Date of Birth: [birth year]  Today's Date: 10/28/2020 OT Individual Time: 0901-1010 OT Individual Time Calculation (min): 69 min    Short Term Goals: Week 1:  OT Short Term Goal 1 (Week 1): Pt will complete UB dressing with supervision for all aspects. OT Short Term Goal 2 (Week 1): Pt will complete LB bathing at min assist level sit to stand for at least two consecuitve sessions. OT Short Term Goal 3 (Week 1): Pt will complete LB dressing with min assist sit to stand. OT Short Term Goal 4 (Week 1): Pt will complete toilet transfers with use of the RW and 3:1 with min assist.  Skilled Therapeutic Interventions/Progress Updates:    Session 1: (0901-1010)  Pt in bed to start session, agreeable to completion of OT session.  She was oriented to place and situation as well as stating deficits from the CVA.  She transitioned to sitting with supervision and was able to complete donning her shoes with increased time and min assist.  She then completed transfer stand pivot to the wheelchair with mod assist in order to transition out of the room.  She was able to complete transfer to the therapy mat with mod assist.  Completed Box and Blocks test sitting EOM.  She was able to complete 39 blocks with the right hand and 24 blocks with the left hand.  Educated pt on FM coordination activity as well to work on in her room picking up small pieces of foam and placing them into a cup one at a time with the LUE.  She was able to pick them up off of the table with ataxic movement noted and place them in the cup, however she proceeded to turn the cup over 3-4 times while trying to move her hand away from it.  Transitioned to working on LUE weightbearing in quadriped while washing the mat with the RUE and picking up and placing larger pegs.  Rest breaks given in sidelying and tall kneeling with min assist.  Also had her incorporate  the LUE in quadriped as well for both activities with some ataxia noted.  Finished session with return to the wheelchair at [facility] assist and was taken back to the room.  She was left with her daughter and with the call button and phone in reach awaiting PT in 15 mins.  Discussed session and homework given with daughter as well as expectations for conference tomorrow.    Session 2: [phone])  Pt in recliner to start, worked on reviewing some of her sessions from earlier today.  She was able to recall PT session but needed min instructional cueing to recall SLP session from earlier.  She completed stand pivot transfers throughout session with mod assist to the wheelchair, mat, and the recliner.  Sitting balance EOM was supervision with sit to stand transitions for picking up cones from the floor with mod assist.  She worked on visual tracking following therapist's finger left to right as well as up and down for 30 second intervals.  Noted pt with decreased ability to stay on the finger with tracking up and down, overshooting therapist frequently. This was also present with horizontal tracking, but not as frequent.  She was unable to maintain her eyes on target consistently with gaze stabilization as well left to right or superior and inferior.  No report of dizziness with any head movements noted.  She did report dizziness  briefly with supine to sit with BP in supine at 94/71 and then in sitting at 111/92.  Pt reports no difficulty reading or seeing the TV.  Returned to the room at end of session with transfer to the recliner at the previous stated level.  She was left with the safety alarm in place and with the call button and phone in reach working on FM coordination task issued earlier.     Therapy Documentation Precautions:  Precautions Precautions: Fall Precaution Comments: RUE and RLE ataxia Restrictions Weight Bearing Restrictions: No  Pain: Pain Assessment Pain Scale: Faces Pain Score: 0-No  pain ADL: See Care Tool Section for some details of mobility and selfcare   Therapy/Group: Individual Therapy  Shiah Berhow OTR/L 10/28/2020, 12:06 PM

## 2020-10-29 NOTE — Progress Notes (Signed)
Occupational Therapy Session Note  Patient Details  Name: Pamela Mason MRN: [id number] Date of Birth: [birth year]  Today's Date: 10/29/2020 OT Individual Time: 1002-1029 OT Individual Time Calculation (min): 27 min    Short Term Goals: Week 1:  OT Short Term Goal 1 (Week 1): Pt will complete UB dressing with supervision for all aspects. OT Short Term Goal 2 (Week 1): Pt will complete LB bathing at min assist level sit to stand for at least two consecuitve sessions. OT Short Term Goal 3 (Week 1): Pt will complete LB dressing with min assist sit to stand. OT Short Term Goal 4 (Week 1): Pt will complete toilet transfers with use of the RW and 3:1 with min assist.  Skilled Therapeutic Interventions/Progress Updates:    Session 1 (1002-1029)  Pt completed FM coordination tasks sitting EOC this session.  Had her complete picking up checkers one at a time with the LUE and placing in the Connect Four grid placed at eye level.  She continues to demonstrate ataxia with functional reach.  Progressed to having her pick up 2 checkers, 1 at a time for in-hand manipulation and then place them in the grid as well.  No drops noted.  Finished by having her try to pick them up and stack them on top of each other.  Increased difficulty noted with this but he was able to complete 6-7 with increased time.  Finished session with pt in the wheelchair with the call button and phone in reach and safety belt in place.    Session 2: (1435-1530)  Pt completed supine to sit EOB with supervision.  She then transferred over to the recliner with mod assist stand pivot to work on doffing her gripper socks and donning regular socks and shoes.  She was able to complete donning both shoes on her feet, with easiest method to cross the LLE over the right knee to donn it.  She was able to tie both as well but needed 5-6 attempts to successfully tie the left one.  Next, had her transfer to the wheelchair at [facility] assist level again and  then she was rolled down to the ortho gym.  Had her work on LUE coordination and strengthening with use of the UE ergonometer and the BITS system.  She was able to complete 1 set of 3 mins using BUEs on the UE ergonometer at level 5 resistance for 3 mins.  RPMs maintained at 24-26 throughout.  The final 2 sets were completed for 2 mins at the same resistance level using only the LUE.  RPMs maintained at 10-14.  With use of the BITs she was standing statically with min assist and mod demonstrational cueing to maintain left knee and hip extension while engaged in Visual Scanning Program with the LUE.  She was able to complete 2 min intervals with only 35% accuracy on the first attempt at an average of 4.5 seconds between each dot.  She then completed a second set at 69% with an average of 2.4 seconds.  Finished session with return to the room via wheelchair and pt left up with her son and granddaughter present.  Call button and phone in reach.    Therapy Documentation Precautions:  Precautions Precautions: Fall Precaution Comments: RUE and RLE ataxia Restrictions Weight Bearing Restrictions: No General:   Vital Signs:   Pain:   ADL: ADL Eating: Set up Where Assessed-Eating: Chair Grooming: Supervision/safety Where Assessed-Grooming: Edge of bed Upper Body Bathing: Supervision/safety Where Assessed-Upper Body  Bathing: Edge of bed Lower Body Bathing: Moderate assistance Where Assessed-Lower Body Bathing: Edge of bed Upper Body Dressing: Minimal assistance Where Assessed-Upper Body Dressing: Edge of bed Lower Body Dressing: Moderate assistance Where Assessed-Lower Body Dressing: Edge of bed Toileting: Moderate assistance Where Assessed-Toileting: Bedside Commode Toilet Transfer: Moderate assistance Toilet Transfer Method: Stand pivot Science writer: Radiographer, therapeutic: Not assessed Social research officer, government: Not assessed Vision   Perception    Praxis    Exercises:   Other Treatments:     Therapy/Group: Individual Therapy  Chontel Warning OTR/L 10/29/2020, 11:04 AM

## 2020-10-29 NOTE — Progress Notes (Signed)
Speech Language Pathology Daily Session Note  Patient Details  Name: Pamela Mason MRN: [id number] Date of Birth: 11-01-1945  Today's Date: 10/29/2020 SLP Individual Time: 6244-6950 SLP Individual Time Calculation (min): 45 min  Short Term Goals: Week 1: SLP Short Term Goal 1 (Week 1): Pt will demonstrate susatained attention to functional task in 15 minute intervals with supervision A verbal cues. SLP Short Term Goal 2 (Week 1): Pt will demonstrate recall of novel, daily information with mod A verbal cues for use of visual aids. SLP Short Term Goal 3 (Week 1): Pt will demonstrate basic-mildly complex problem solving skills with mod A verbal cues. SLP Short Term Goal 4 (Week 1): Pt will self-monitor and self-correct functional errors with min A verbal cues.  Skilled Therapeutic Interventions:   Patient seen for skilled ST session focusing on cognitive goals. Patient able to write down in her memory book, two different tasks completed during her PT session which was just prior to SLP's session. She then participated in mild complex alternating attention task with using calorie counting booklet to calculate calories in a foods that made up a meal on recipe. After minA cues to introduce task, patient able to complete with minA-supervision. Patient continues to benefit from skilled SLP intervention to maximize cognitive function prior to discharge.  Pain Pain Assessment Pain Scale: 0-10 Pain Score: 0-No pain Faces Pain Scale: No hurt  Therapy/Group: Individual Therapy  Sonia Baller, MA, CCC-SLP Speech Therapy

## 2020-10-29 NOTE — Progress Notes (Signed)
Physical Therapy Session Note  Patient Details  Name: Pamela Mason MRN: [id number] Date of Birth: 1945/09/06  Today's Date: 10/29/2020 PT Individual Time: 5638-7564 and 0935-1002 PT Individual Time Calculation (min): 44 min and 27 min  Short Term Goals: Week 1:  PT Short Term Goal 1 (Week 1): Patient will complete sit <> stand with LRAD and CGA PT Short Term Goal 2 (Week 1): Patient will completed bed<> wc transfer with LRAD and MinA PT Short Term Goal 3 (Week 1): Patient will ambulate >42ft with LRAD and MinA  Skilled Therapeutic Interventions/Progress Updates:    Session 1: Pt received supine in bed and agreeable to therapy session. Supine>sitting R EOB, HOB flat and not using bedrail, supervision with increased effort noted. Denies lightheadedness/dizziness upon coming up. Pt reports need to use bathroom. Sit<>stands using RW with min assist for balance - cuing not to place both hands on RW while coming to stand. Gait training ~69ft 2x in/out bathroom using RW with min assist for balance - cuing for sequencing AD and stepping L LE when turning - cuing to feel seat on her legs behind her prior to initiating sit. Standing with min assist for balance able to manage LB clothing without assist. Continent of bladder, seated per-care with set-up assist. Standing at sink with intermittent UE support and min assist for balance - pt prefers to place L LE forward and then stand with all weight through R LE, cuing to bring L LE back and have symmetrical stance but this causes increased instability with L lean needing min assist for balance. Gait training ~128ft using RW with min progressed to mod assist for balance - continues to have impaired L LE strength and coordination with excessive hip external rotation and excessive adduction during swing causing narrow BOS and L lean as well as progressively worsening L LE foot clearance during swing with fatigue - cuing for improved L LE gait mechanics and  upright, midline posture. At end of session pt left sitting in w/c with needs in reach, RN present, and seat belt alarm on.  Session 2: Pt received sitting in w/c and agreeable to therapy session.  Transported to/from gym in w/c for time management and energy conservation. Therapist swapped out w/c cushion for foam style due to the Roho air causing poor L LE alignment with excessive hip internal rotation and adduction (which are already tight) resulting in over lengthened hip abductors (which are weak). Dynamic gait training at hallway rail with B UE support progressed to only L UE during lateral side stepping ~69ft down/back, min assist for balance - cuing for increased L LE step length and foot clearance - facilitation for increased L hip abductor activation and stability during stance - pt continues to demo impaired L hip strength resulting in poor L knee alignment and pt reporting some L lateral knee discomfort, stopped this task with details below. Sitting in w/c, B LE hip abductor strengthening against level 2 theraband resistance x10reps - pt with significant strength impairment with difficulty moving L LE into abduction against this amount of resistance, external target provided to increase recruitment with slight improvement noted. Repeated sit<>stands to/from w/c using B UE support on armrests and min assist for balance - mirror feedback- cuing and facilitation for increased L hip abductor activation to improve knee alignment x8 reps. Transported back to room. L stand pivot w/c>recliner with B UE support on therapist's shoulders and min assist for balance. Pt left seated in recliner in the care of  OT.  Therapy Documentation Precautions:  Precautions Precautions: Fall Precaution Comments: RUE and RLE ataxia Restrictions Weight Bearing Restrictions: No   Pain: Session 1: No reports of pain throughout session.  Session 2: Reports some L lateral knee discomfort during side stepping -  discontinued task and provided seated rest break with gentle, seated knee flexion/extension AROM x15 reps.   Therapy/Group: Individual Therapy  Tawana Scale , PT, DPT, CSRS 10/29/2020, 7:52 AM

## 2020-10-29 NOTE — Progress Notes (Addendum)
PROGRESS NOTE   Subjective/Complaints:  No issues overnite, denies pain, sleeping ok  ROS:  Pt denies SOB, abd pain, CP, N/V/C/D, and vision changes   Objective:   No results found. Recent Labs    10/26/20 1626  WBC 5.9  HGB 12.6  HCT 37.3  PLT 238    Recent Labs    10/26/20 1626  CREATININE 0.82     Intake/Output Summary (Last 24 hours) at 10/29/2020 0839 Last data filed at 10/29/2020 0745 Gross per 24 hour  Intake 683 ml  Output --  Net 683 ml         Physical Exam: Vital Signs Blood pressure 111/63, pulse 61, temperature 97.9 F (36.6 C), temperature source Oral, resp. rate 16, height 5\' 4"  (1.626 m), weight 55.8 kg, SpO2 96 %.   General: No acute distress Mood and affect are appropriate Heart: Regular rate and rhythm no rubs murmurs or extra sounds Lungs: Clear to auscultation, breathing unlabored, no rales or wheezes Abdomen: Positive bowel sounds, soft nontender to palpation, nondistended Extremities: No clubbing, cyanosis, or edema Skin: No evidence of breakdown, no evidence of rash  Musculoskeletal:        General: No deformity.    Cervical back: Neck supple.    Right lower leg: No edema.    Left lower leg: No edema. Skin:    General: Skin is warm and dry. Neurological:    Mental Status: She is alert and oriented to person, place, and time.    Motor: Weakness present.    Coordination: Coordination abnormal. Finger-Nose-Finger Test abnormal and Heel to [facility] Test abnormal.    Gait: Gait abnormal.Amb with walker , PT, + Left foot drop requiring cueing    Comments: Motor 4/5 left delt bi, trik grip HF, KE, 3/5 ADF Left UE ataxia    Assessment/Plan: 1. Functional deficits which require 3+ hours per day of interdisciplinary therapy in a comprehensive inpatient rehab setting. Physiatrist is providing close team supervision and 24 hour management of active medical problems listed  below. Physiatrist and rehab team continue to assess barriers to discharge/monitor patient progress toward functional and medical goals  Care Tool:  Bathing    Body parts bathed by patient: Right arm, Left arm, Chest, Abdomen, Right upper leg, Left upper leg, Right lower leg, Left lower leg, Face   Body parts bathed by helper: Front perineal area, Buttocks     Bathing assist Assist Level: Moderate Assistance - Patient 50 - 74%     Upper Body Dressing/Undressing Upper body dressing   What is the patient wearing?: Pull over shirt    Upper body assist Assist Level: Minimal Assistance - Patient > 75%    Lower Body Dressing/Undressing Lower body dressing      What is the patient wearing?: Underwear/pull up, Pants     Lower body assist Assist for lower body dressing: Moderate Assistance - Patient 50 - 74%     Toileting Toileting    Toileting assist Assist for toileting: Maximal Assistance - Patient 25 - 49%     Transfers Chair/bed transfer  Transfers assist     Chair/bed transfer assist level: Minimal Assistance - Patient > 75% (squat  pivot)     Locomotion Ambulation   Ambulation assist      Assist level: Moderate Assistance - Patient 50 - 74% Assistive device: Walker-rolling Max distance: 119ft   Walk 10 feet activity   Assist     Assist level: Moderate Assistance - Patient - 50 - 74% Assistive device: Walker-rolling   Walk 50 feet activity   Assist Walk 50 feet with 2 turns activity did not occur: Safety/medical concerns  Assist level: Moderate Assistance - Patient - 50 - 74% Assistive device: Walker-rolling    Walk 150 feet activity   Assist Walk 150 feet activity did not occur: Safety/medical concerns  Assist level: Moderate Assistance - Patient - 50 - 74% Assistive device: Walker-rolling    Walk 10 feet on uneven surface  activity   Assist Walk 10 feet on uneven surfaces activity did not occur: Safety/medical concerns          Wheelchair     Assist Will patient use wheelchair at discharge?: No             Wheelchair 50 feet with 2 turns activity    Assist            Wheelchair 150 feet activity     Assist          Blood pressure 111/63, pulse 61, temperature 97.9 F (36.6 C), temperature source Oral, resp. rate 16, height 5\' 4"  (1.626 m), weight 55.8 kg, SpO2 96 %.  Medical Problem List and Plan: 1.  Decrease self care and mobility  secondary to Right subcortical infarct             -patient may  shower             -ELOS/Goals: 12-14d sup goals F/u neuro 4 wks post rehab dc  First day of evaluations- con't PT, OT and SLP 2.  Antithrombotics: -DVT/anticoagulation:  Pharmaceutical: Lovenox             -antiplatelet therapy: ASA 81mg , and clopidigrel75mg  x 3 wk then plavix alone 3. Pain Management: Tylenol prn 4. Mood:Sertraline 100mg  daily             -antipsychotic agents: none 5. Neuropsych: This patient is capable of making decisions on her own behalf would need family assistance for more complex financial and medical decisions. 6. Skin/Wound Care: monitor 7. Fluids/Electrolytes/Nutrition: low sodium HDPD 8.  HTN lisinopril 10mg   6/20- BP well cotrolled- slightly bradycardic- will con't to monitor Vitals:   10/29/20 0501 10/29/20 0553  BP:  111/63  Pulse: (!) 55 61  Resp: 16   Temp: 97.9 F (36.6 C)   SpO2: 96%     9.  Mild dementia Galantamine 8mg  per day 10.  HLD simvastatin 40mg  daily 11.  Vit D deficiency Ergocalciferol 1.25 mg daily 12.  B12 supplemkent 577mcg daily 13. Constipation colace 1 po qd  6/20- bowels working well per pt- con't regimen        LOS: 3 days A FACE TO [place] Naarah [place] 10/29/2020, 8:39 AM

## 2020-10-29 NOTE — Patient Care Conference (Signed)
Inpatient RehabilitationTeam Conference and Plan of Care Update Date: 10/29/2020   Time: 10:53 AM    Patient Name: Pamela Mason      Medical Record Number: [id number]  Date of Birth: Oct 16, 1945 Sex: Female         Room/Bed: 5C09C/5C09C-01 Payor Info: Payor: MEDICARE / Plan: MEDICARE PART A AND B / Product Type: *No Product type* /    Admit Date/Time:  10/26/2020  2:27 PM  Primary Diagnosis:  Subcortical infarction [facility])  Hospital Problems: Principal Problem:   Subcortical infarction [facility])    Expected Discharge Date: Expected Discharge Date: 11/13/20  Team Members Present: Physician leading conference: Dr. Alysia Penna Care Coodinator Present: Dorien Chihuahua, RN, BSN, CRRN;Christina Callahan, [place] Nurse Present: Dorien Chihuahua, RN PT Present: Page Spiro, PT OT Present: Clyda Greener, OT SLP Present: Other (comment) Gunnar Fusi, SLP) PPS Coordinator present : Gunnar Fusi, SLP     Current Status/Progress Goal Weekly Team Focus  Bowel/Bladder   Continent B/B LBM 6/21  remain continent  offer BSC as needed   Swallow/Nutrition/ Hydration             ADL's   Min assist for UB selfcare with min to mod for LB selfcare.  Mod assist for stand pivot transfers.  increased ataxia in the LUE but uses at a diminshed level for selfcare tasks.  supervison overall  sefcare retraining, transfer training, DME education, balance retraining, therapeutic exercise, pt education,   Mobility   CGA/min assist supine<>sit, min assist squat pivot transfers, min assist sit<>stands using RW, mod assist gait up to 135ft using RW, min assist 2 steps using B HRs  supervision overall at ambulatory level  activity tolerance, pt/family education, transfer training, standing balance, gait training, L LE NMR, DME education/training   Communication             Safety/Cognition/ Behavioral Observations  mod-min A  Supervision A (possibility upgraded some to mod I depending on LOS)  mildly complex  problem solving, error awareness, recall with aids anf sustained attention   Pain   Denies pain  less than 3 out of 10  Assess pain q shift and PRN   Skin   Loop recorder site to Lt chest abd pad and tape  no skin breakdown. no signs/symptoms of infection  assess skin q shift and prn     Discharge Planning:  Dauhgters plan for patient to d/c to [place] (ILF)   Team Discussion: Left hemiparesis ; left side weakness and UE ataxia with hx of mild dementia. BP controlled per MD; patient with dizziness going supine to sit and poor attention to tasks.  Patient on target to meet rehab goals: yes, currently supervision for upper body bathing and min assist for upper body dressing. Mod assist without an assisstive device for stand pivot transfers,. Able to ambulate 150' with mod assist due to heavy left lean and poor attention to tasks. Requires min - mod assist for cognition at present. Supervision goals set for overall function at discharge.  *See Care Plan and progress notes for long and short-term goals.   Revisions to Treatment Plan:  Assess need for AFO  Working on cognition, mildly complex problem solving ,and recall with external aides and memory book.  Teaching Needs: Safety, medications, etc.  Current Barriers to Discharge: Decreased caregiver support  Possible Resolutions to Barriers: Family education with daughters     Medical Summary Current Status: RIght subcortical infarct, HTN controlled  Barriers to Discharge: Other (comments)  Barriers  to Discharge Comments: fall risk Left foot drop Possible Resolutions to [employer] Focus: cotn rehab , monitor vitals, may need AFO   Continued Need for Acute Rehabilitation Level of Care: The patient requires daily medical management by a physician with specialized training in physical medicine and rehabilitation for the following reasons: Direction of a multidisciplinary physical rehabilitation program to maximize functional  independence : Yes Medical management of patient stability for increased activity during participation in an intensive rehabilitation regime.: Yes Analysis of laboratory values and/or radiology reports with any subsequent need for medication adjustment and/or medical intervention. : Yes   I attest that I was present, lead the team conference, and concur with the assessment and plan of the team.   Margarito Liner 10/29/2020, 12:35 PM

## 2020-10-29 NOTE — Progress Notes (Signed)
Team Conference Report to Patient/Family  Team Conference discussion was reviewed with the patient and caregiver, including goals, any changes in plan of care and target discharge date.  Patient and caregiver express understanding and are in agreement.  The patient has a target discharge date of 11/13/20.   Sw met with patient and called pt daughters at bedside. Provided conference updates to pt and daughters. Pt son meeting with ILF to determine hours of care at facility. No additional questions or concerns, sw will cont to follow up. Dyanne Iha 10/29/2020, 1:18 PM

## 2020-10-30 NOTE — Progress Notes (Signed)
Occupational Therapy Session Note  Patient Details  Name: Pamela Mason MRN: [id number] Date of Birth: 1945/08/13  Today's Date: 10/30/2020 OT Individual Time: 2130-8657 OT Individual Time Calculation (min): 76 min    Short Term Goals: Week 1:  OT Short Term Goal 1 (Week 1): Pt will complete UB dressing with supervision for all aspects. OT Short Term Goal 2 (Week 1): Pt will complete LB bathing at min assist level sit to stand for at least two consecuitve sessions. OT Short Term Goal 3 (Week 1): Pt will complete LB dressing with min assist sit to stand. OT Short Term Goal 4 (Week 1): Pt will complete toilet transfers with use of the RW and 3:1 with min assist.  Skilled Therapeutic Interventions/Progress Updates:    Pt received sem-reclined in bed, denies pain, agreeable to therapy. Session focus on LUE NMR, L visual scanning, BUE strengthening, dynamic standing balance, toileting, and shower transfers in prep for improved ADL/func mobility performance. Came to sitting EOB with close S + increased time. Stand-pivot without AD throughout session with CGA. Stand-pivot to and from toilet with use of grab bar with CGA. CGA in standing for LB clothing management + seated pericare. Cont void of bladder. W/c transports to and from gym total A 2/2 time constraints and energy conservation. Stood at [employer] to complete single target game with use of LUE with close S for balance with 90.79% accuracy and 1.73 reaction time. Reports 5/10 on mod RPE. Additionally completed Bell Cancellation Task within 3 min with 4 misses on L side of screen. Educated on L visual scanning techniques to avoid missing obstacles in her environment. Completed several lines of Valene Bors letter cancellation task with 100% accuracy. Completed the following with 2 lb dowel rod 2x10 both seated and in standing : forward/backward rows, chest press, and shoulder press with good technique and no LOB. Finally, completed 1 simulated walk-in  shower transfer via posterior entry method with RW with CGA and mod Vcs for technique + use of grab bar. Reports she is unsure of what her bathroom set-up is as she is moving into [employer] upon DC - called son to confirm that she will have walk-in with 1 inch barrier and grab bars. Son has shower chair for her, as well. Stand-pivot to recliner with CGA and no AD.    Pt left seated in recliner with chair alarm engaged, call bell in reach, and all immediate needs met.    Therapy Documentation Precautions:  Precautions Precautions: Fall Precaution Comments: RUE and RLE ataxia Restrictions Weight Bearing Restrictions: No Pain: denies   ADL: See Care Tool for more details.   Therapy/Group: Individual Therapy  Volanda Napoleon MS, OTR/L   10/30/2020, 6:57 AM

## 2020-10-30 NOTE — Progress Notes (Signed)
PROGRESS NOTE   Subjective/Complaints:  Discussed d/c date with pt   ROS:  Pt denies SOB, abd pain, CP, N/V/C/D, and vision changes   Objective:   No results found. No results for input(s): WBC, HGB, HCT, PLT in the last 72 hours.  No results for input(s): NA, K, CL, CO2, GLUCOSE, BUN, CREATININE, CALCIUM in the last 72 hours.   Intake/Output Summary (Last 24 hours) at 10/30/2020 0740 Last data filed at 10/29/2020 1255 Gross per 24 hour  Intake 395 ml  Output --  Net 395 ml         Physical Exam: Vital Signs Blood pressure 103/72, pulse 63, temperature 97.9 F (36.6 C), resp. rate 14, height 5\' 4"  (1.626 m), weight 55.8 kg, SpO2 94 %.  General: No acute distress Mood and affect are appropriate Heart: Regular rate and rhythm no rubs murmurs or extra sounds Lungs: Clear to auscultation, breathing unlabored, no rales or wheezes Abdomen: Positive bowel sounds, soft nontender to palpation, nondistended Extremities: No clubbing, cyanosis, or edema Skin: No evidence of breakdown, no evidence of rash  Musculoskeletal:        General: No deformity.    Cervical back: Neck supple.    Right lower leg: No edema.    Left lower leg: No edema. Skin:    General: Skin is warm and dry. Neurological:    Mental Status: She is alert and oriented to person, place, and time.    Motor: Weakness present.    Coordination: Coordination abnormal. Finger-Nose-Finger Test abnormal and Heel to [facility] Test abnormal.    Gait: Gait abnormal.Amb with walker , PT, + Left foot drop requiring cueing    Comments: Motor 4/5 left delt bi, trik grip HF, KE, 3/5 ADF Left UE ataxia    Assessment/Plan: 1. Functional deficits which require 3+ hours per day of interdisciplinary therapy in a comprehensive inpatient rehab setting. Physiatrist is providing close team supervision and 24 hour management of active medical problems listed  below. Physiatrist and rehab team continue to assess barriers to discharge/monitor patient progress toward functional and medical goals  Care Tool:  Bathing    Body parts bathed by patient: Right arm, Left arm, Chest, Abdomen, Right upper leg, Left upper leg, Right lower leg, Left lower leg, Face   Body parts bathed by helper: Front perineal area, Buttocks     Bathing assist Assist Level: Minimal Assistance - Patient > 75%     Upper Body Dressing/Undressing Upper body dressing   What is the patient wearing?: Pull over shirt    Upper body assist Assist Level: Minimal Assistance - Patient > 75%    Lower Body Dressing/Undressing Lower body dressing      What is the patient wearing?: Underwear/pull up, Pants     Lower body assist Assist for lower body dressing: Moderate Assistance - Patient 50 - 74%     Toileting Toileting    Toileting assist Assist for toileting: Minimal Assistance - Patient > 75%     Transfers Chair/bed transfer  Transfers assist     Chair/bed transfer assist level: Minimal Assistance - Patient > 75%     Locomotion Ambulation   Ambulation assist  Assist level: Moderate Assistance - Patient 50 - 74% Assistive device: Walker-rolling Max distance: 140ft   Walk 10 feet activity   Assist     Assist level: Minimal Assistance - Patient > 75% Assistive device: Walker-rolling   Walk 50 feet activity   Assist Walk 50 feet with 2 turns activity did not occur: Safety/medical concerns  Assist level: Moderate Assistance - Patient - 50 - 74% Assistive device: Walker-rolling    Walk 150 feet activity   Assist Walk 150 feet activity did not occur: Safety/medical concerns  Assist level: Moderate Assistance - Patient - 50 - 74% Assistive device: Walker-rolling    Walk 10 feet on uneven surface  activity   Assist Walk 10 feet on uneven surfaces activity did not occur: Safety/medical concerns          Wheelchair     Assist Will patient use wheelchair at discharge?: No             Wheelchair 50 feet with 2 turns activity    Assist            Wheelchair 150 feet activity     Assist          Blood pressure 103/72, pulse 63, temperature 97.9 F (36.6 C), resp. rate 14, height 5\' 4"  (1.626 m), weight 55.8 kg, SpO2 94 %.  Medical Problem List and Plan: 1.  Decrease self care and mobility  secondary to Right subcortical infarct             -patient may  shower             -ELOS/Goals: 7/7 24/7 sup goals F/u neuro 4 wks post rehab dc  con't PT, OT and SLP 2.  Antithrombotics: -DVT/anticoagulation:  Pharmaceutical: Lovenox             -antiplatelet therapy: ASA 81mg , and clopidigrel75mg  x 3 wk then plavix alone 3. Pain Management: Tylenol prn 4. Mood:Sertraline 100mg  daily             -antipsychotic agents: none 5. Neuropsych: This patient is capable of making decisions on her own behalf would need family assistance for more complex financial and medical decisions. 6. Skin/Wound Care: monitor 7. Fluids/Electrolytes/Nutrition: low sodium HDPD 8.  HTN lisinopril 10mg   6/23 BP well controlled- slightly bradycardic- will con't to monitor Vitals:   10/29/20 2016 10/30/20 0616  BP: 106/72 103/72  Pulse: 64 63  Resp: 14 14  Temp: 98.4 F (36.9 C) 97.9 F (36.6 C)  SpO2: 94% 94%    9.  Mild dementia Galantamine 8mg  per day 10.  HLD simvastatin 40mg  daily 11.  Vit D deficiency Ergocalciferol 1.25 mg daily 12.  B12 supplemkent 565mcg daily 13. Constipation colace 1 po qd  6/20- bowels working well per pt- con't regimen        LOS: 4 days A FACE TO [place] E [place] 10/30/2020, 7:40 AM

## 2020-10-30 NOTE — Progress Notes (Signed)
Physical Therapy Session Note  Patient Details  Name: Pamela Mason MRN: [id number] Date of Birth: 03-19-1946  Today's Date: 10/30/2020 PT Individual Time: 6269-4854 PT Individual Time Calculation (min): 75 min   Short Term Goals: Week 1:  PT Short Term Goal 1 (Week 1): Patient will complete sit <> stand with LRAD and CGA PT Short Term Goal 2 (Week 1): Patient will completed bed<> wc transfer with LRAD and MinA PT Short Term Goal 3 (Week 1): Patient will ambulate >16ft with LRAD and MinA  Skilled Therapeutic Interventions/Progress Updates:    Pt received supine in bed and agreeable to therapy session. Supine>sitting R EOB supervision. Sitting EOB donned socks and shoes with increased time for L UE coordination when tying. L stand pivot EOB>w/c HHA with min assist for balance.  Transported to/from gym in w/c for time management and energy conservation. Gait training ~255ft using RW with min assist for balance - pt demoing improving L LE gait mechanics compared to yesterday although continues to have excessive adduction causing narrow BOS and L lean as well as decreased L LE foot clearance that became more apparent with longer distances. Sit<>supine on mat with supervision.  On mat performed L LE NMR and strengthening of:  - R sidelying L LE clamshells x10reps no resistance then 2x10reps of manual resistance - in supine pt demos decreased L L hip abduction PROM and AROM with pt reporting "stretching" discomfort near L hip abductors when ranging >15degrees into abduction - supine bridging x10reps with pt reporting some B knee discomfort therefore transitioned to a slight knee extended position with ankles in DF 2x15 reps with cuing to maintain L hip abducted for proper knee alignment and facilitation to keep L ankle in DF  Performed dynamic reaching task in tall kneeling focusing on L LE hip strengthening in [place] and L UE NMR while reaching to grasp clothespins and then place on a line without  UE support - requires CGA to min assist to maintain balance.  Gait training ~132ft 2x, no AD, with min/mod assist for balance - requires increased cuing for: - upright posture because pt looks down at feet; with associated cuing for increased hip extension  - increased L LE hip abduction during swing to avoid scissoring (this also targets improved R/L weight shift as pt tends to lean L) - increased L LE foot clearance during swing - reciprocal stepping pattern  Gait training ~66ft 2x using RW with pt continuing to demo above gait impairments but improving.  Dynamic standing balance on biodex using % weight bearing targeting midline orientation awareness - removed the visual feedback with pt demoing improved ability to find midline.  Transported back up to room. L stand pivot w/c>recliner, HHA, with min assist for balance. Pt left seated in recliner with needs in reach and chair alarm on.    Therapy Documentation Precautions:  Precautions Precautions: Fall Precaution Comments: RUE and RLE ataxia Restrictions Weight Bearing Restrictions: No   Pain: Reports some B knee discomfort during bridging tasks - modified positioning and symptoms improved.     Therapy/Group: Individual Therapy  Tawana Scale , PT, DPT, CSRS 10/30/2020, 2:14 PM

## 2020-10-30 NOTE — Progress Notes (Signed)
Speech Language Pathology Daily Session Note  Patient Details  Name: Pamela Mason MRN: [id number] Date of Birth: 05-23-1945  Today's Date: 10/30/2020 SLP Individual Time: 1332-1415 SLP Individual Time Calculation (min): 43 min  Short Term Goals: Week 1: SLP Short Term Goal 1 (Week 1): Pt will demonstrate susatained attention to functional task in 15 minute intervals with supervision A verbal cues. SLP Short Term Goal 2 (Week 1): Pt will demonstrate recall of novel, daily information with mod A verbal cues for use of visual aids. SLP Short Term Goal 3 (Week 1): Pt will demonstrate basic-mildly complex problem solving skills with mod A verbal cues. SLP Short Term Goal 4 (Week 1): Pt will self-monitor and self-correct functional errors with min A verbal cues.  Skilled Therapeutic Interventions: Pt seen for skilled ST with focus on cognitive goals, pt daughter present for initial portion of session, discussed pt progress and remaining deficits. Pt with no recall of memory notebook, SLP locating and room and filling out to increase carryover of orientation concepts and therapeutic activities. SLP facilitating mildly complex calendar task by providing min fading to supervision level cues to complete accurately. Pt participating in deductive reasoning activity and requiring mod A verbal cues to increase error awareness and recall problem solving strategies as trained when "stuck" during the task. Of note, pt questioning and requiring education throughout session on alarms to maintain safety, not walking unassisted, fall risk and calling for help. Receptive to information but does demonstrate decreased insight into current cognitive and physical impairments which impact safety and increase risk of falls. Pt transferred to bed with min A for problem solving, alarm set and all needs within reach. Cont ST POC.   Pain Pain Assessment Pain Scale: 0-10 Pain Score: 0-No pain  Therapy/Group: Individual  Therapy  Dewaine Conger 10/30/2020, 2:58 PM

## 2020-10-31 NOTE — Progress Notes (Signed)
Occupational Therapy Session Note  Patient Details  Name: Pamela Mason MRN: [id number] Date of Birth: 08/30/1945  Today's Date: 10/31/2020 OT Individual Time: 2993-7169 OT Individual Time Calculation (min): 59 min    Short Term Goals: Week 1:  OT Short Term Goal 1 (Week 1): Pt will complete UB dressing with supervision for all aspects. OT Short Term Goal 2 (Week 1): Pt will complete LB bathing at min assist level sit to stand for at least two consecuitve sessions. OT Short Term Goal 3 (Week 1): Pt will complete LB dressing with min assist sit to stand. OT Short Term Goal 4 (Week 1): Pt will complete toilet transfers with use of the RW and 3:1 with min assist.  Skilled Therapeutic Interventions/Progress Updates:    Pt greeted in bed with no c/o pain. Agreeable to tx, declining shower. Toileting needs met. Started with focus on bilateral hand coordination, had her fold medium sized towels, wash cloths, and pillowcases x5 reps each piece of linen. Education placed on visual attendance to her Lt ataxic limb to improve precision/fine motor control. Afterwards worked on higher level balance challenges by ambulating in community settings of gift shop and outdoor patio. CGA for ambulation with RW, CGA for sit<stand from low seats without armrests (middle of a bench and cushioned seat in the atrium). Pt very grateful for the opportunity to go outdoors today, describes herself as an "outdoor person." She was then returned to the room via w/c and changed her pants with CGA, also able to don/doff sneakers and room shoes with setup today. Left her in the recliner at close of session, all needs within reach.   Therapy Documentation Precautions:  Precautions Precautions: Fall Precaution Comments: RUE and RLE ataxia Restrictions Weight Bearing Restrictions: No  Vital Signs: Therapy Vitals Temp: 98.1 F (36.7 C) Pulse Rate: 75 Resp: 17 BP: 114/77 Patient Position (if appropriate):  Sitting Oxygen Therapy O2 Device: Room Air   ADL: ADL Eating: Set up Where Assessed-Eating: Chair Grooming: Supervision/safety Where Assessed-Grooming: Edge of bed Upper Body Bathing: Supervision/safety Where Assessed-Upper Body Bathing: Edge of bed Lower Body Bathing: Moderate assistance Where Assessed-Lower Body Bathing: Edge of bed Upper Body Dressing: Minimal assistance Where Assessed-Upper Body Dressing: Edge of bed Lower Body Dressing: Moderate assistance Where Assessed-Lower Body Dressing: Edge of bed Toileting: Moderate assistance Where Assessed-Toileting: Bedside Commode Toilet Transfer: Moderate assistance Toilet Transfer Method: Stand pivot Toilet Transfer Equipment: Engineer, technical sales Transfer: Not assessed Social research officer, government: Not assessed     Therapy/Group: Individual Therapy  Trey Gulbranson A Dameer Speiser 10/31/2020, 4:06 PM

## 2020-10-31 NOTE — Progress Notes (Signed)
Occupational Therapy Session Note  Patient Details  Name: Pamela Mason MRN: [id number] Date of Birth: 14-Jul-1945  Today's Date: 10/31/2020 OT Individual Time: 9381-8299 OT Individual Time Calculation (min): 55 min    Short Term Goals: Week 1:  OT Short Term Goal 1 (Week 1): Pt will complete UB dressing with supervision for all aspects. OT Short Term Goal 2 (Week 1): Pt will complete LB bathing at min assist level sit to stand for at least two consecuitve sessions. OT Short Term Goal 3 (Week 1): Pt will complete LB dressing with min assist sit to stand. OT Short Term Goal 4 (Week 1): Pt will complete toilet transfers with use of the RW and 3:1 with min assist.  Skilled Therapeutic Interventions/Progress Updates:    Pt resting in bed upon arrival. Pt declined bathing/dressing this morning. Stand pivot tranfsers to w/c with CGA. Pt transitioned to day room and engaged in table activities with focus on scanning to Lt and functional use of LUE. Activities included colored peg board and Connect 4. Pt completed all tasks with extra time. 9 hole peg test: Rt-24.5 secs, Lt-1'15". Pt with moderate dysmetria during all tasks. Pt returned to room and transferred to recliner. All needs within reach and seat alarm activated.   Therapy Documentation Precautions:  Precautions Precautions: Fall Precaution Comments: RUE and RLE ataxia Restrictions Weight Bearing Restrictions: No Pain: Pain Assessment Pain Scale: 0-10 Pain Score: 0-No pain   Therapy/Group: Individual Therapy  Leroy Libman 10/31/2020, 9:01 AM

## 2020-10-31 NOTE — Progress Notes (Signed)
PROGRESS NOTE   Subjective/Complaints:  No issues overnite   ROS:  Pt denies SOB, abd pain, CP, N/V/C/D, and vision changes   Objective:   No results found. No results for input(s): WBC, HGB, HCT, PLT in the last 72 hours.  No results for input(s): NA, K, CL, CO2, GLUCOSE, BUN, CREATININE, CALCIUM in the last 72 hours.   Intake/Output Summary (Last 24 hours) at 10/31/2020 0277 Last data filed at 10/30/2020 1742 Gross per 24 hour  Intake 480 ml  Output --  Net 480 ml         Physical Exam: Vital Signs Blood pressure 116/69, pulse 61, temperature 97.9 F (36.6 C), resp. rate 18, height 5\' 4"  (1.626 m), weight 55.8 kg, SpO2 96 %.   General: No acute distress Mood and affect are appropriate Heart: Regular rate and rhythm no rubs murmurs or extra sounds Lungs: Clear to auscultation, breathing unlabored, no rales or wheezes Abdomen: Positive bowel sounds, soft nontender to palpation, nondistended Extremities: No clubbing, cyanosis, or edema Skin: No evidence of breakdown, no evidence of rash   Musculoskeletal:        General: No deformity.    Cervical back: Neck supple.    Right lower leg: No edema.    Left lower leg: No edema. Skin:    General: Skin is warm and dry. Neurological:    Mental Status: She is alert and oriented to person, place, and time.    Motor: Weakness present.    Coordination: Coordination abnormal. Finger-Nose-Finger Test abnormal and Heel to [facility] Test abnormal.    Gait: Gait abnormal.Amb with walker , PT, + Left foot drop requiring cueing    Comments: Motor 4/5 left delt bi, trik grip HF, KE, 3/5 ADF Left UE ataxia    Assessment/Plan: 1. Functional deficits which require 3+ hours per day of interdisciplinary therapy in a comprehensive inpatient rehab setting. Physiatrist is providing close team supervision and 24 hour management of active medical problems listed below. Physiatrist  and rehab team continue to assess barriers to discharge/monitor patient progress toward functional and medical goals  Care Tool:  Bathing    Body parts bathed by patient: Right arm, Left arm, Chest, Abdomen, Right upper leg, Left upper leg, Right lower leg, Left lower leg, Face   Body parts bathed by helper: Front perineal area, Buttocks     Bathing assist Assist Level: Minimal Assistance - Patient > 75%     Upper Body Dressing/Undressing Upper body dressing   What is the patient wearing?: Pull over shirt    Upper body assist Assist Level: Contact Guard/Touching assist    Lower Body Dressing/Undressing Lower body dressing      What is the patient wearing?: Underwear/pull up     Lower body assist Assist for lower body dressing: Minimal Assistance - Patient > 75%     Toileting Toileting    Toileting assist Assist for toileting: Contact Guard/Touching assist     Transfers Chair/bed transfer  Transfers assist     Chair/bed transfer assist level: Minimal Assistance - Patient > 75%     Locomotion Ambulation   Ambulation assist      Assist level: Moderate Assistance -  Patient 50 - 74% Assistive device: No Device Max distance: 171ft   Walk 10 feet activity   Assist     Assist level: Moderate Assistance - Patient - 50 - 74% Assistive device: No Device   Walk 50 feet activity   Assist Walk 50 feet with 2 turns activity did not occur: Safety/medical concerns  Assist level: Moderate Assistance - Patient - 50 - 74% Assistive device: No Device    Walk 150 feet activity   Assist Walk 150 feet activity did not occur: Safety/medical concerns  Assist level: Moderate Assistance - Patient - 50 - 74% Assistive device: No Device    Walk 10 feet on uneven surface  activity   Assist Walk 10 feet on uneven surfaces activity did not occur: Safety/medical concerns         Wheelchair     Assist Will patient use wheelchair at discharge?: No              Wheelchair 50 feet with 2 turns activity    Assist            Wheelchair 150 feet activity     Assist          Blood pressure 116/69, pulse 61, temperature 97.9 F (36.6 C), resp. rate 18, height 5\' 4"  (1.626 m), weight 55.8 kg, SpO2 96 %.  Medical Problem List and Plan: 1.  Decrease self care and mobility  secondary to Right subcortical infarct             -patient may  shower             -ELOS/Goals: 7/7 24/7 sup goals F/u neuro 4 wks post rehab dc  con't PT, OT and SLP 2.  Antithrombotics: -DVT/anticoagulation:  Pharmaceutical: Lovenox             -antiplatelet therapy: ASA 81mg , and clopidigrel75mg  x 3 wk then plavix alone 3. Pain Management: Tylenol prn 4. Mood:Sertraline 100mg  daily             -antipsychotic agents: none 5. Neuropsych: This patient is capable of making decisions on her own behalf would need family assistance for more complex financial and medical decisions. 6. Skin/Wound Care: monitor 7. Fluids/Electrolytes/Nutrition: low sodium HDPD 8.  HTN lisinopril 10mg   6/24 BP well controlled- slightly bradycardic- will con't to monitor Vitals:   10/30/20 1916 10/31/20 0405  BP: 114/66 116/69  Pulse: (!) 58 61  Resp: 18 18  Temp: 97.8 F (36.6 C) 97.9 F (36.6 C)  SpO2: 95% 96%    9.  Mild dementia Galantamine 8mg  per day 10.  HLD simvastatin 40mg  daily 11.  Vit D deficiency Ergocalciferol 1.25 mg daily 12.  B12 supplemkent 531mcg daily 13. Constipation colace 1 po qd  Last BM 6/23 type 6 medium        LOS: 5 days A FACE TO FACE EVALUATION WAS PERFORMED  Charlett Blake 10/31/2020, 7:22 AM

## 2020-10-31 NOTE — Progress Notes (Signed)
Speech Language Pathology Daily Session Note  Patient Details  Name: Pamela Mason MRN: [id number] Date of Birth: 20-Oct-1945  Today's Date: 10/31/2020 SLP Individual Time: 1026-1108 SLP Individual Time Calculation (min): 42 min  Short Term Goals: Week 1: SLP Short Term Goal 1 (Week 1): Pt will demonstrate susatained attention to functional task in 15 minute intervals with supervision A verbal cues. SLP Short Term Goal 2 (Week 1): Pt will demonstrate recall of novel, daily information with mod A verbal cues for use of visual aids. SLP Short Term Goal 3 (Week 1): Pt will demonstrate basic-mildly complex problem solving skills with mod A verbal cues. SLP Short Term Goal 4 (Week 1): Pt will self-monitor and self-correct functional errors with min A verbal cues.  Skilled Therapeutic Interventions: Pt seen for skilled ST with focus on cognitive goals. Pt demonstrating episodes of forgetfulness this AM, decreased recall of biographical information. SLP facilitating orientation exercise by providing calendar to understand, recall and carryover orientation to time, recent events and discharge date. Pt participating in simple divided attention task requiring mod A cues to problem solve for 70% accuracy. SLP completing memory notebook with pt detailing events of previous OT session with min assistance. Pt left in room with PT entering for tx session. Cont ST POC.   Pain Pain Assessment Pain Scale: 0-10 Pain Score: 0-No pain  Therapy/Group: Individual Therapy  Dewaine Conger 10/31/2020, 12:05 PM

## 2020-10-31 NOTE — Evaluation (Signed)
Recreational Therapy Assessment and Plan  Patient Details  Name: Pamela Mason MRN: [id number] Date of Birth: 05-22-1945 Today's Date: 10/31/2020  Rehab Potential:  Good ELOS:   d/c 7/7  Assessment  Hospital Problem: Principal Problem:   Subcortical infarction [facility])     Past Medical History:      Past Medical History:  Diagnosis Date   Anxiety     Arthritis     Complication of anesthesia      slow to wake up   Dementia ([place]) 2018    memory loss   Depression     Hyperlipidemia     Hypertension     Osteopenia     Sleep apnea      Past Surgical History:       Past Surgical History:  Procedure Laterality Date   COLONOSCOPY       INTRACAPSULAR CATARACT EXTRACTION       LOOP RECORDER INSERTION N/A 10/24/2020    Procedure: LOOP RECORDER INSERTION;  Surgeon: Constance Haw, MD;  Location: [facility];  Service: Cardiovascular;  Laterality: N/A;   ROTATOR CUFF REPAIR       TOTAL HIP ARTHROPLASTY Left 01/02/2019    Procedure: TOTAL HIP ARTHROPLASTY ANTERIOR APPROACH;  Surgeon: Paralee Cancel, MD;  Location: [facility];  Service: Orthopedics;  Laterality: Left;  70 mins   TUBAL LIGATION          Assessment & Plan Clinical Impression: [name] with recent admission to the hospital on 10/23/20 d/t acute left- sided weakness and dizziness. Pt was not a candidate for tPA d/t initial CT showing chronic right MCA frontal infarct. Pt was preparing to be discharged home when she had recurrent left-sided weakness, left limb ataxia, and left neglect. Code stroke activated. Patient transferred to CIR on 10/26/2020 .      Met with pt today to discuss TR services, leisure interests, activity analysis/modifications and coping strategies.  Pt presents with decreased activity tolerance, decreased functional mobility, decreased balance, and decreased coordination, left inattention, decreased attention, decreased awareness, decreased problem solving, decreased  safety awareness, and decreased memory Limiting pt's independence with leisure/community pursuits.  PLAN:  Min 1 TR session >20 minutes per week during LOS   Recommendations for other services: None   Discharge Criteria: Patient will be discharged from TR if patient refuses treatment 3 consecutive times without medical reason.  If treatment goals not met, if there is a change in medical status, if patient makes no progress towards goals or if patient is discharged from hospital.  The above assessment, treatment plan, treatment alternatives and goals were discussed and mutually agreed upon: by patient  [place] 10/31/2020, 12:32 PM

## 2020-10-31 NOTE — Progress Notes (Signed)
Physical Therapy Session Note  Patient Details  Name: Pamela Mason MRN: [id number] Date of Birth: January 01, 1946  Today's Date: 10/31/2020 PT Individual Time:1103-1200     Short Term Goals: Week 1:  PT Short Term Goal 1 (Week 1): Patient will complete sit <> stand with LRAD and CGA PT Short Term Goal 2 (Week 1): Patient will completed bed<> wc transfer with LRAD and MinA PT Short Term Goal 3 (Week 1): Patient will ambulate >56f with LRAD and MinA   Skilled Therapeutic Interventions/Progress Updates:   Pt received supine in bed and agreeable to PT. Supine>sit transfer with supervision assist and cues for decreased use of bed rail as tolerated.   [facility] x 159ffor force use of LUE with min assist to prevent veer to the L  Gait training with RW in rehab gym x 15073fith min assist to improved weight shift to the R and reduce trendelenberg on the L.  Dynamic gait training in parallel bars side stepping R and L [id number] 4 bil with min assist and cues for improved activation of hip abductors and min-mod assist to stabilize the trunk and prevent compensatory movements.    Stand pivot transfer with CGA with RW as listed above to nustep. Nustep reciprocal movement training 4 min with BUE/BLE + 2 min  BLE only. Min=mod assist/verbal.tactile instruction to improve activation of hip abduction with knee flexion on the LLE throughout. Level 4-2 intermittently.   Patient returned to room and performed stand pivot to recliner with RW and CGA. Pt left sitting in recliner with call bell in reach and all needs met.         Therapy Documentation Precautions:  Precautions Precautions: Fall Precaution Comments: RUE and RLE ataxia Restrictions Weight Bearing Restrictions: No  Pain: Pain Assessment Pain Scale: 0-10 Pain Score: 0-No pain    Therapy/Group: Individual Therapy  AustLorie Phenix4/2022, 11:38 AM

## 2020-11-01 NOTE — Progress Notes (Signed)
Physical Therapy Session Note  Patient Details  Name: Pamela Mason MRN: [id number] Date of Birth: [birth year]  Today's Date: 11/01/2020 PT Individual Time: 0905-1001 and 4270-6237 PT Individual Time Calculation (min): 56 min and 41 min  Short Term Goals: Week 1:  PT Short Term Goal 1 (Week 1): Patient will complete sit <> stand with LRAD and CGA PT Short Term Goal 2 (Week 1): Patient will completed bed<> wc transfer with LRAD and MinA PT Short Term Goal 3 (Week 1): Patient will ambulate >16ft with LRAD and MinA  Skilled Therapeutic Interventions/Progress Updates:    Session 1: Pt received supine in bed and agreeable to therapy session. Supine>sitting not using bed features with supervision. Sitting EOB donned socks and shoes set-up assist. L stand pivot EOB>w/c, no AD, with CGA for steadying.  Transported to/from gym in w/c for time management and energy conservation.   Gait training ~248ft, no AD, with min assist for balance - continues to have decreased L LE hip/knee flexion during swing causing decreased foot clearance with minor anterior LOB, continues to have decreased L hip stability during stance; arms abducted in guarded posturing - despite these impairments pt continues to demo significant improvement compared to prior sessions with this therapist.   Stair navigation training using B HRs - ascending with reciprocal pattern to promote L LE NMR and descending with step-to leading with L LE - min assist for balance - initially pt not clearing L foot during ascent requiring 2 attempts to lift foot high enough to place on step, improves with increased repetition.  Supine<>sit on mat with supervision. Supine bridging while sustaining B ankle DF to increase hamstring activation 2x15 reps - cuing for proper technique.   Gait training 223ft (including at least 6 turns), no AD, with min assist for balance - facilitation at hips to promote R/L weight shifting onto stance limb promoting  increased L hip stability during stance which resulted in improved L LE swing phase mechanics of increased hip/knee flexion with increased foot clearance.   Dynamic gait training in // bars:  - lateral side stepping with BUE support progressed to no UE support, min assist for balance - continuing to facilitate at hips for increased L LE hip stability in stance - backwards walking with B UE support progressed to no UE support (tends to actually do better without using UE support because one less thing to attend to) with min assist for balance and facilitation at L hip for stability - able to progress to reciprocal pattern as opposed to step-to leading with L LE  Gait training ~72ft 2x to/from // bars, no AD, with min assist for balance and continued facilitation for L hip stability during stance.   Repeated sit<>stands to/from EOM, no UE support, with CGA for steadying - requires max cuing and external target to maintain L hip abducted (avoid adducted and internally rotated positioning) x8 reps. Transported back up to room and left in care of NT to assist with toileting.    Session 2: Pt received sitting in recliner and eager to participate in therapy session despite reporting some fatigue. R stand pivot recliner>w/c, no AD, with CGA for steadying.  Transported to/from gym in w/c for time management and energy conservation. Participated in standing balance, standing tolerance, and L UE NMR tasks using BITS system as follows:  - Reaction time task of tapping blue dots, in 1min hit 51 targets with average reaction time of 2.35sec  - Bell cancellation test with pt demoing  good visual scanning strategy and only missing 3 of the bells  - Sequencing task (# then Letter) with pt requiring 59min to complete 49 items averaging 12.29 seconds *this was the most challenging task for pt and noted if it required her ~5seconds or greater to find the target she was currently looking for then she would require  max/total cuing to reorient to where she was at in the sequence Pt demos improved L LE positioning during static stance for equal BLE WBing (no longer placing L foot forward with biased R LE WBing as was noted earlier in pt's CIR stay). Pt able to maintain standing for at least 60minutes throughout this with only close supervision and intermittent CGA. Transported back up towards room. Gait training ~18ft, no AD, with min assist for balance - continued facilitation at pt's hips to promote weight shifting onto stance limb with increased L hip stability. At end of session pt left sitting in recliner with needs in reach and chair alarm on.  Therapy Documentation Precautions:  Precautions Precautions: Fall Precaution Comments: RUE and RLE ataxia Restrictions Weight Bearing Restrictions: No   Pain:  Session 1: Denies pain during session.  Session 2: Denies pain during session.   Therapy/Group: Individual Therapy  Tawana Scale , PT, DPT, CSRS 11/01/2020, 7:53 AM

## 2020-11-01 NOTE — Progress Notes (Signed)
PROGRESS NOTE   Subjective/Complaints:  No complaints this morning. Denies pain, constipation, insomnia.    ROS:  Pt denies SOB, abd pain, CP, N/V/C/D, and vision changes   Objective:   No results found. No results for input(s): WBC, HGB, HCT, PLT in the last 72 hours.  No results for input(s): NA, K, CL, CO2, GLUCOSE, BUN, CREATININE, CALCIUM in the last 72 hours.   Intake/Output Summary (Last 24 hours) at 11/01/2020 1625 Last data filed at 11/01/2020 1312 Gross per 24 hour  Intake 357 ml  Output --  Net 357 ml        Physical Exam: Vital Signs Blood pressure 113/73, pulse 60, temperature (!) 97.5 F (36.4 C), temperature source Oral, resp. rate 17, height 5\' 4"  (1.626 m), weight 55.8 kg, SpO2 99 %. Gen: no distress, normal appearing HEENT: oral mucosa pink and moist, NCAT Cardio: Reg rate Chest: normal effort, normal rate of breathing Abd: soft, non-distended Ext: no edema Psych: pleasant, normal affect Musculoskeletal:        General: No deformity.    Cervical back: Neck supple.    Right lower leg: No edema.    Left lower leg: No edema. Skin:    General: Skin is warm and dry. Neurological:    Mental Status: She is alert and oriented to person, place, and time.    Motor: Weakness present.    Coordination: Coordination abnormal. Finger-Nose-Finger Test abnormal and Heel to [facility] Test abnormal.    Gait: Gait abnormal.Amb with walker , PT, + Left foot drop requiring cueing    Comments: Motor 4/5 left delt bi, trik grip HF, KE, 3/5 ADF Left UE ataxia    Assessment/Plan: 1. Functional deficits which require 3+ hours per day of interdisciplinary therapy in a comprehensive inpatient rehab setting. Physiatrist is providing close team supervision and 24 hour management of active medical problems listed below. Physiatrist and rehab team continue to assess barriers to discharge/monitor patient progress toward  functional and medical goals  Care Tool:  Bathing    Body parts bathed by patient: Right arm, Left arm, Chest, Abdomen, Right upper leg, Left upper leg, Right lower leg, Left lower leg, Face   Body parts bathed by helper: Front perineal area, Buttocks     Bathing assist Assist Level: Minimal Assistance - Patient > 75%     Upper Body Dressing/Undressing Upper body dressing   What is the patient wearing?: Pull over shirt    Upper body assist Assist Level: Contact Guard/Touching assist    Lower Body Dressing/Undressing Lower body dressing      What is the patient wearing?: Pants     Lower body assist Assist for lower body dressing: Contact Guard/Touching assist     Toileting Toileting    Toileting assist Assist for toileting: Contact Guard/Touching assist     Transfers Chair/bed transfer  Transfers assist     Chair/bed transfer assist level: Minimal Assistance - Patient > 75%     Locomotion Ambulation   Ambulation assist      Assist level: Moderate Assistance - Patient 50 - 74% Assistive device: No Device Max distance: 128ft   Walk 10 feet activity   Assist  Assist level: Moderate Assistance - Patient - 50 - 74% Assistive device: No Device   Walk 50 feet activity   Assist Walk 50 feet with 2 turns activity did not occur: Safety/medical concerns  Assist level: Moderate Assistance - Patient - 50 - 74% Assistive device: No Device    Walk 150 feet activity   Assist Walk 150 feet activity did not occur: Safety/medical concerns  Assist level: Moderate Assistance - Patient - 50 - 74% Assistive device: No Device    Walk 10 feet on uneven surface  activity   Assist Walk 10 feet on uneven surfaces activity did not occur: Safety/medical concerns         Wheelchair     Assist Will patient use wheelchair at discharge?: No             Wheelchair 50 feet with 2 turns activity    Assist            Wheelchair 150  feet activity     Assist          Blood pressure 113/73, pulse 60, temperature (!) 97.5 F (36.4 C), temperature source Oral, resp. rate 17, height 5\' 4"  (1.626 m), weight 55.8 kg, SpO2 99 %.  Medical Problem List and Plan: 1.  Decrease self care and mobility  secondary to Right subcortical infarct             -patient may  shower             -ELOS/Goals: 7/7 24/7 sup goals F/u neuro 4 wks post rehab dc  Continue PT, OT and SLP 2.  Impaired mobility, ambulating 450 feet, d/c Lovenox             -antiplatelet therapy: ASA 81mg , and clopidigrel75mg  x 3 wk then plavix alone 3. Pain Management: Tylenol prn 4. Depression: Continue Sertraline 100mg  daily             -antipsychotic agents: none 5. Neuropsych: This patient is capable of making decisions on her own behalf would need family assistance for more complex financial and medical decisions. 6. Skin/Wound Care: monitor 7. Fluids/Electrolytes/Nutrition: low sodium HDPD 8.  HTN: continue lisinopril 10mg   6/25 BP well controlled- slightly bradycardic- will con't to monitor Vitals:   11/01/20 0518 11/01/20 1325  BP: 105/67 113/73  Pulse: 60 60  Resp: 14 17  Temp: 98.9 F (37.2 C) (!) 97.5 F (36.4 C)  SpO2: 95% 99%    9.  Mild dementia: continue Galantamine 8mg  per day 10.  HLD simvastatin 40mg  daily 11.  Vit D deficiency Ergocalciferol 50,000U once per week for 7 weeks.  12.  B12 supplemkent 513mcg daily 13. Constipation colace 1 po qd  Last BM 6/23 type 6 medium        LOS: 6 days A FACE TO FACE EVALUATION WAS PERFORMED  Pamela Mason 11/01/2020, 4:25 PM

## 2020-11-01 NOTE — Progress Notes (Signed)
Speech Language Pathology Daily Session Note  Patient Details  Name: Pamela Mason MRN: [id number] Date of Birth: 05/19/1945  Today's Date: 11/01/2020 SLP Individual Time: 1346-1430 SLP Individual Time Calculation (min): 44 min  Short Term Goals: Week 1: SLP Short Term Goal 1 (Week 1): Pt will demonstrate susatained attention to functional task in 15 minute intervals with supervision A verbal cues. SLP Short Term Goal 2 (Week 1): Pt will demonstrate recall of novel, daily information with mod A verbal cues for use of visual aids. SLP Short Term Goal 3 (Week 1): Pt will demonstrate basic-mildly complex problem solving skills with mod A verbal cues. SLP Short Term Goal 4 (Week 1): Pt will self-monitor and self-correct functional errors with min A verbal cues.  Skilled Therapeutic Interventions: Pt seen for skilled ST with focus on cognitive skills, pt pleasant and eager with all therapeutic tasks. SLP facilitating 6-item sequencing task by providing min fading to supervision A for error awareness. Pt benefits from prompt to double check task which increased accuracy to 100%. Pt completing mildly complex divided attention task benefiting from mod A cues for 60% accuracy. Again, having patient double check directions/written responses increased accuracy. Pt states she has questions re: heart device and monitoring system. SLP reviewing literature provided to pt and shown written information re: password for device and navigating App. Pt will benefit from ongoing training and education in this area. Left in recliner with alarm set and all needs within reach. Cont ST POC.   Pain Pain Assessment Pain Scale: 0-10 Pain Score: 0-No pain  Therapy/Group: Individual Therapy  Dewaine Conger 11/01/2020, 2:20 PM

## 2020-11-01 NOTE — Plan of Care (Signed)
  Problem: Consults Goal: RH GENERAL PATIENT EDUCATION Description: See Patient Education module for education specifics. Outcome: Progressing Goal: Skin Care Protocol Initiated - if Braden Score 18 or less Description: If consults are not indicated, leave blank or document N/A Outcome: Progressing Goal: Nutrition Consult-if indicated Outcome: Progressing Goal: Diabetes Guidelines if Diabetic/Glucose > 140 Description: If diabetic or lab glucose is > 140 mg/dl - Initiate Diabetes/Hyperglycemia Guidelines & Document Interventions  Outcome: Progressing   Problem: RH BOWEL ELIMINATION Goal: RH STG MANAGE BOWEL WITH ASSISTANCE Description: STG Manage Bowel with mod I Assistance. Outcome: Progressing Goal: RH STG MANAGE BOWEL W/MEDICATION W/ASSISTANCE Description: STG Manage Bowel with Medication with mod I  Assistance. Outcome: Progressing   Problem: RH BLADDER ELIMINATION Goal: RH STG MANAGE BLADDER WITH ASSISTANCE Description: STG Manage Bladder With no Assistance Outcome: Progressing   Problem: RH SAFETY Goal: RH STG ADHERE TO SAFETY PRECAUTIONS W/ASSISTANCE/DEVICE Description: STG Adhere to Safety Precautions With cues/reminders Assistance/Device. Outcome: Progressing   Problem: RH KNOWLEDGE DEFICIT Goal: RH STG INCREASE KNOWLEDGE OF HYPERTENSION Description: Patient will be able to manage HTN with medications and dietary modifications using handouts and educational tools with cues/reminders Outcome: Progressing Goal: RH STG INCREASE KNOWLEGDE OF HYPERLIPIDEMIA Description: Patient will be able to manage HLD with medications and dietary modifications using handouts and educational tools with cues/reminders Outcome: Progressing Goal: RH STG INCREASE KNOWLEDGE OF STROKE PROPHYLAXIS Description: Patient will be able to manage secondary stroke risks with medications and dietary modifications using handouts and educational tools with cues/reminders Outcome: Progressing   Problem:  Consults Goal: RH STROKE PATIENT EDUCATION Description: See Patient Education module for education specifics  Outcome: Progressing

## 2020-11-02 NOTE — Progress Notes (Signed)
Physical Therapy Session Note  Patient Details  Name: Pamela Mason MRN: [id number] Date of Birth: 04/17/1946  Today's Date: 11/02/2020 PT Individual Time: 1405-1520 PT Individual Time Calculation (min): 75 min   Short Term Goals: Week 1:  PT Short Term Goal 1 (Week 1): Patient will complete sit <> stand with LRAD and CGA PT Short Term Goal 2 (Week 1): Patient will completed bed<> wc transfer with LRAD and MinA PT Short Term Goal 3 (Week 1): Patient will ambulate >35ft with LRAD and MinA  Skilled Therapeutic Interventions/Progress Updates:   Pt standing at sink with NT, combing hair.  She denied pain.  Stand >sit onto bed with supervision.  Pt donned shoes iwht min assist, sitting EOB.  Stand pivot transfers throughout session with close supervision, without cues.  neuromuscular re-education via demo, forced use for  -in parallel bars, bil UE supportR/L sidestepping on/off 4" high step bench   -Seated with bil LE support>0LE support for reciprocal scooting forward/backward.   -Supine: R straight leg raises focusing on eccentric control, lower trunk rotation.  -Hook lying: adductor squeezes, adductor squeezes with bil bridging, alternating R/L ankle pumps.   -R/L side lying: PNF for L/R pelvis anterior elevation>< posterior depression, L/R clam shells.   -Gait training without AD x 100' with Theraband around hips for tactile cues, min/mod assist.   Gait training using grocery cart as AD, Theraband around hips for tactile cues, x 120' iwht min assist.  Therapeutic activity in sitting, reaching out of BOS R/L to facilitate trunk shortening/lengthening for more effective wt shifting in gait.   Therapeutic activity, in biased to R standing ,retrieving and replacing matching cards with R hand on vertical board overhead, min assist for balance.   At end of session, pt sitting in recliner with needs at hand and seat belt alarm set.     Therapy Documentation Precautions:   Precautions Precautions: Fall Precaution Comments: RUE and RLE ataxia Restrictions Weight Bearing Restrictions: No      Therapy/Group: Individual Therapy  Gurnie Duris 11/02/2020, 4:09 PM

## 2020-11-02 NOTE — Plan of Care (Signed)
  Problem: Consults Goal: RH GENERAL PATIENT EDUCATION Description: See Patient Education module for education specifics. Outcome: Progressing Goal: Skin Care Protocol Initiated - if Braden Score 18 or less Description: If consults are not indicated, leave blank or document N/A Outcome: Progressing Goal: Nutrition Consult-if indicated Outcome: Progressing Goal: Diabetes Guidelines if Diabetic/Glucose > 140 Description: If diabetic or lab glucose is > 140 mg/dl - Initiate Diabetes/Hyperglycemia Guidelines & Document Interventions  Outcome: Progressing   Problem: RH BOWEL ELIMINATION Goal: RH STG MANAGE BOWEL WITH ASSISTANCE Description: STG Manage Bowel with mod I Assistance. Outcome: Progressing Goal: RH STG MANAGE BOWEL W/MEDICATION W/ASSISTANCE Description: STG Manage Bowel with Medication with mod I  Assistance. Outcome: Progressing   Problem: RH BLADDER ELIMINATION Goal: RH STG MANAGE BLADDER WITH ASSISTANCE Description: STG Manage Bladder With no Assistance Outcome: Progressing   Problem: RH SAFETY Goal: RH STG ADHERE TO SAFETY PRECAUTIONS W/ASSISTANCE/DEVICE Description: STG Adhere to Safety Precautions With cues/reminders Assistance/Device. Outcome: Progressing   Problem: RH KNOWLEDGE DEFICIT Goal: RH STG INCREASE KNOWLEDGE OF HYPERTENSION Description: Patient will be able to manage HTN with medications and dietary modifications using handouts and educational tools with cues/reminders Outcome: Progressing Goal: RH STG INCREASE KNOWLEGDE OF HYPERLIPIDEMIA Description: Patient will be able to manage HLD with medications and dietary modifications using handouts and educational tools with cues/reminders Outcome: Progressing Goal: RH STG INCREASE KNOWLEDGE OF STROKE PROPHYLAXIS Description: Patient will be able to manage secondary stroke risks with medications and dietary modifications using handouts and educational tools with cues/reminders Outcome: Progressing   Problem:  Consults Goal: RH STROKE PATIENT EDUCATION Description: See Patient Education module for education specifics  Outcome: Progressing

## 2020-11-03 NOTE — Progress Notes (Signed)
Occupational Therapy Session Note  Patient Details  Name: Pamela Mason MRN: [id number] Date of Birth: 05-Aug-1945  Today's Date: 11/03/2020 OT Individual Time: 0820-1000 OT Individual Time Calculation (min): 100 min    Short Term Goals: Week 1:  OT Short Term Goal 1 (Week 1): Pt will complete UB dressing with supervision for all aspects. OT Short Term Goal 2 (Week 1): Pt will complete LB bathing at min assist level sit to stand for at least two consecuitve sessions. OT Short Term Goal 3 (Week 1): Pt will complete LB dressing with min assist sit to stand. OT Short Term Goal 4 (Week 1): Pt will complete toilet transfers with use of the RW and 3:1 with min assist.  Skilled Therapeutic Interventions/Progress Updates:    Pt received in room in bed and consented to OT tx. Pt seen for morning ADL routine including bathing, dressing, grooming, toileting, and functional transfer training. Pt walked with RW and CGA from EOB to toilet, completed all aspects of toileting and transfer with CGA. Pt then doffed pants all the way with SUP. Pt then transferred to TTB in walk in shower with CGA and cuing for proper technique. Covered pt's chest dressing to remain dry. Bathed the SUP while seated and standing using grab bars. Pt then transferred out of shower and walked to EOB to get dressed with CGA. Pt req min A to pull sports bra down all the way, completed LB dressing with SUP using RW to stand to hike pants. After dressing, pt walked to sink with CGA and stood sink side with close SUP to dry hair, brush teeth, and complete grooming routine. Pt then sat down to don socks and tennis shoes with setup, but then required min A to get L heel all the way into shoe.  Pt then wheeled down to day room for time mgmt, instructed in standing 24 piece jumbo jigsaw puzzle to improve B manipulation, crossing midline, visual scanning, executive function, problem solving, and standing tolerance and balance for increased ADL  safety and independence. Pt able to complete puzzle with close SUP while standing. Pt then instructed in [facility] activities with focus on L hand. Instructed in small tacks and cork board activity with pt requiring increased time to complete task with L hand to improve [facility] skills. Issued green sponge and gripper and instructed in strengthening exercises for L hand and fingers. After tx, pt handed off to PT.  Therapy Documentation Precautions:  Precautions Precautions: Fall Precaution Comments: RUE and RLE ataxia Restrictions Weight Bearing Restrictions: No  Pain: none    Therapy/Group: Individual Therapy  [name] 11/03/2020, 8:52 AM

## 2020-11-03 NOTE — Progress Notes (Signed)
Occupational Therapy Weekly Progress Note  Patient Details  Name: Pamela Mason MRN: [id number] Date of Birth: [birth year]  Beginning of progress report period: October 27, 2020 End of progress report period: November 03, 2020     Patient has met 3 of 4 short term goals. Pt only requires min A for donning sports bra for UBD.  Patient continues to demonstrate the following deficits: muscle weakness, decreased cardiorespiratoy endurance, unbalanced muscle activation and decreased coordination, and decreased standing balance and decreased balance strategies and therefore will continue to benefit from skilled OT intervention to enhance overall performance with BADL and iADL.  Patient progressing toward long term goals..  Continue plan of care.  OT Short Term Goals Week 1:  OT Short Term Goal 1 (Week 1): Pt will complete UB dressing with supervision for all aspects. OT Short Term Goal 1 - Progress (Week 1): Progressing toward goal OT Short Term Goal 2 (Week 1): Pt will complete LB bathing at min assist level sit to stand for at least two consecuitve sessions. OT Short Term Goal 2 - Progress (Week 1): Met OT Short Term Goal 3 (Week 1): Pt will complete LB dressing with min assist sit to stand. OT Short Term Goal 3 - Progress (Week 1): Met OT Short Term Goal 4 (Week 1): Pt will complete toilet transfers with use of the RW and 3:1 with min assist. OT Short Term Goal 4 - Progress (Week 1): Met Week 2:  OT Short Term Goal 1 (Week 2): Pt will complete all aspects of UB dressing with SUP. OT Short Term Goal 2 (Week 2): Pt will complete transfers to all appropriate surfaces with SUP and LRAD. OT Short Term Goal 3 (Week 2): Pt will bathe with setup while seated in shower. OT Short Term Goal 4 (Week 2): Pt will complete all aspects of toileting with SUP.  Skilled Therapeutic Interventions/Progress Updates:    Pt is making excellent progress with skilled OT. Pt is only requiring min A at most with ADLs  and functional transfers and mobility during ADL routine. Pt is making great progress to SUP/mod I level with ADLs, however continues to require min A for dynamic balance during functional tasks.  Please see last treatment note for all current ADL levels.   Therapy Documentation Precautions:  Precautions Precautions: Fall Precaution Comments: RUE and RLE ataxia Restrictions Weight Bearing Restrictions: No  Pain: 0/10      Therapy/Group: Individual Therapy  Norell Brisbin 11/03/2020, 9:41 AM

## 2020-11-03 NOTE — Progress Notes (Signed)
Speech Language Pathology Weekly Progress and Session Note  Patient Details  Name: Pamela Mason MRN: [id number] Date of Birth: July 24, 1945  Beginning of progress report period: October 27, 2020 End of progress report period: November 03, 2020  Today's Date: 11/03/2020 SLP Individual Time: 1345-1415 SLP Individual Time Calculation (min): 30 min  Short Term Goals: Week 1: SLP Short Term Goal 1 (Week 1): Pt will demonstrate susatained attention to functional task in 15 minute intervals with supervision A verbal cues. SLP Short Term Goal 1 - Progress (Week 1): Met SLP Short Term Goal 2 (Week 1): Pt will demonstrate recall of novel, daily information with mod A verbal cues for use of visual aids. SLP Short Term Goal 2 - Progress (Week 1): Met SLP Short Term Goal 3 (Week 1): Pt will demonstrate basic-mildly complex problem solving skills with mod A verbal cues. SLP Short Term Goal 3 - Progress (Week 1): Met SLP Short Term Goal 4 (Week 1): Pt will self-monitor and self-correct functional errors with min A verbal cues. SLP Short Term Goal 4 - Progress (Week 1): Met    New Short Term Goals: Week 2: SLP Short Term Goal 1 (Week 2): Pt will demonstrate selective attention to functional tasks within mildly distracting environment with Min A verbal cues. SLP Short Term Goal 2 (Week 2): Pt will demonstrate recall of novel, daily information with min A verbal cues for use of visual aids. SLP Short Term Goal 3 (Week 2): Pt will demonstrate basic-mildly complex problem solving skills with min A verbal cues. SLP Short Term Goal 4 (Week 2): Pt will self-monitor and self-correct functional errors with supervision A verbal cues.  Weekly Progress Updates: Patient demonstrates consistent progress and has met 4 of 4 short-term goals. Patient currently requiring Min A fading to Supervision level for mildly complex problem solving. Moderate A verbal cues for more complex tasks. Error awareness is improving with the  need for Min A to identify errors. She inconsistently recalls use of her memory notebook which is utilized during each speech session with support of ST to initiate. She is receptive to information but does demonstrate decreased insight into current cognitive and physical impairments which impacts safety and increases risk of falls. Patient would continue to benefit from skilled ST intervention to further maximize cognitive function and functional independence prior to discharge. Patient progressing toward long-term-goals.   Intensity: Minumum of 1-2 x/day, 30 to 90 minutes Frequency: 3 to 5 out of 7 days Duration/Length of Stay: 7/7 Treatment/Interventions: Cognitive remediation/compensation;Cueing hierarchy;Functional tasks;Medication managment;Internal/external aids;Patient/family education   Daily Session Skilled Therapeutic Interventions: Skilled ST treatment performed with focus on cognition. Patient was able to recall 2/4 events/details that occurred earlier in the day in therapy with Min A verbal cues to refer to memory notebook where she was able to provide 4/4 details. She demonstrated mildly complex problem solving with mod A verbal cues progressing to min A verbal cues for accuracy. Patient was left in her bed with alarm activated and needs within reach. Continue per current ST POC.    Therapy/Group: Individual Therapy  [name] 11/03/2020, 3:44 PM

## 2020-11-03 NOTE — Progress Notes (Signed)
PROGRESS NOTE   Subjective/Complaints:   Seen with PT, no issues overnite , pt aware of d/c date next week  ROS:  Pt denies SOB, abd pain, CP, N/V/C/D, and vision changes   Objective:   No results found. No results for input(s): WBC, HGB, HCT, PLT in the last 72 hours.  No results for input(s): NA, K, CL, CO2, GLUCOSE, BUN, CREATININE, CALCIUM in the last 72 hours.   Intake/Output Summary (Last 24 hours) at 11/03/2020 0942 Last data filed at 11/03/2020 0710 Gross per 24 hour  Intake 600 ml  Output --  Net 600 ml         Physical Exam: Vital Signs Blood pressure 118/77, pulse (!) 59, temperature 97.7 F (36.5 C), resp. rate 18, height 5\' 4"  (1.626 m), weight 55.8 kg, SpO2 94 %.  General: No acute distress Mood and affect are appropriate Heart: Regular rate and rhythm no rubs murmurs or extra sounds Lungs: Clear to auscultation, breathing unlabored, no rales or wheezes Abdomen: Positive bowel sounds, soft nontender to palpation, nondistended Extremities: No clubbing, cyanosis, or edema  Skin:    General: Skin is warm and dry. Neurological:    Mental Status: She is alert and oriented to person, place, and time.    Motor: Weakness present.    Coordination: Coordination abnormal. Finger-Nose-Finger Test abnormal and Heel to [facility] Test abnormal.    Gait: Gait abnormal.Amb with walker , PT, + Left foot drop requiring cueing    Comments: Motor 4/5 left delt bi, trik grip HF, KE, 3/5 ADF Left UE ataxia    Assessment/Plan: 1. Functional deficits which require 3+ hours per day of interdisciplinary therapy in a comprehensive inpatient rehab setting. Physiatrist is providing close team supervision and 24 hour management of active medical problems listed below. Physiatrist and rehab team continue to assess barriers to discharge/monitor patient progress toward functional and medical goals  Care Tool:  Bathing    Body  parts bathed by patient: Right arm, Left arm, Chest, Abdomen, Right upper leg, Left upper leg, Right lower leg, Left lower leg, Face   Body parts bathed by helper: Front perineal area, Buttocks     Bathing assist Assist Level: Minimal Assistance - Patient > 75%     Upper Body Dressing/Undressing Upper body dressing   What is the patient wearing?: Pull over shirt    Upper body assist Assist Level: Contact Guard/Touching assist    Lower Body Dressing/Undressing Lower body dressing      What is the patient wearing?: Pants     Lower body assist Assist for lower body dressing: Contact Guard/Touching assist     Toileting Toileting    Toileting assist Assist for toileting: Contact Guard/Touching assist     Transfers Chair/bed transfer  Transfers assist     Chair/bed transfer assist level: Supervision/Verbal cueing     Locomotion Ambulation   Ambulation assist      Assist level: Moderate Assistance - Patient 50 - 74% Assistive device: Other (comment) (grocery cart) Max distance: 120   Walk 10 feet activity   Assist     Assist level: Moderate Assistance - Patient - 50 - 74% Assistive device: Other (comment)  Walk 50 feet activity   Assist Walk 50 feet with 2 turns activity did not occur: Safety/medical concerns  Assist level: Moderate Assistance - Patient - 50 - 74% Assistive device: Other (comment)    Walk 150 feet activity   Assist Walk 150 feet activity did not occur: Safety/medical concerns  Assist level: Moderate Assistance - Patient - 50 - 74% Assistive device: No Device    Walk 10 feet on uneven surface  activity   Assist Walk 10 feet on uneven surfaces activity did not occur: Safety/medical concerns         Wheelchair     Assist Will patient use wheelchair at discharge?: No             Wheelchair 50 feet with 2 turns activity    Assist            Wheelchair 150 feet activity     Assist           Blood pressure 118/77, pulse (!) 59, temperature 97.7 F (36.5 C), resp. rate 18, height 5\' 4"  (1.626 m), weight 55.8 kg, SpO2 94 %.  Medical Problem List and Plan: 1.  Decrease self care and mobility  secondary to Right subcortical infarct             -patient may  shower             -ELOS/Goals: 7/7 24/7 sup goals F/u neuro 4 wks post rehab dc  Continue PT, OT and SLP 2.  Impaired mobility, ambulating 450 feet, d/c Lovenox             -antiplatelet therapy: ASA 81mg , and clopidigrel75mg  x 3 wk then plavix alone 3. Pain Management: Tylenol prn 4. Depression: Continue Sertraline 100mg  daily             -antipsychotic agents: none 5. Neuropsych: This patient is capable of making decisions on her own behalf would need family assistance for more complex financial and medical decisions. 6. Skin/Wound Care: monitor 7. Fluids/Electrolytes/Nutrition: low sodium HDPD 8.  HTN: continue lisinopril 10mg   6/27 controlled  Vitals:   11/02/20 1912 11/03/20 0351  BP: 109/66 118/77  Pulse: (!) 55 (!) 59  Resp: 17 18  Temp: 98 F (36.7 C) 97.7 F (36.5 C)  SpO2: 98% 94%    9.  Mild dementia: continue Galantamine 8mg  per day 10.  HLD simvastatin 40mg  daily 11.  Vit D deficiency Ergocalciferol 50,000U once per week for 7 weeks.  12.  B12 supplemkent 538mcg daily 13. Constipation colace 1 po qd  Last BM 6/26        LOS: 8 days A FACE TO FACE EVALUATION WAS PERFORMED  Charlett Blake 11/03/2020, 9:42 AM

## 2020-11-03 NOTE — Progress Notes (Signed)
Physical Therapy Session Note  Patient Details  Name: Pamela Mason MRN: [id number] Date of Birth: May 20, 1945  Today's Date: 11/03/2020 PT Individual Time: 1000-1100 PT Individual Time Calculation (min): 60 min   Short Term Goals: Week 1:  PT Short Term Goal 1 (Week 1): Patient will complete sit <> stand with LRAD and CGA PT Short Term Goal 2 (Week 1): Patient will completed bed<> wc transfer with LRAD and MinA PT Short Term Goal 3 (Week 1): Patient will ambulate >27ft with LRAD and MinA  Skilled Therapeutic Interventions/Progress Updates:    Pt received seated in w/c in dayroom handed off from OT session. No complaints of pain. Agreeable to PT session. Sit to stand with CGA and no AD throughout session. Ambulation x 150 ft with no AD and mod A for lateral weight shift, pt very ataxic through BLE and trunk with occasional scissoring of BLE as well as path deviation. Ambulation x 150 ft with RW and CGA with improvement in BLE ataxic, ongoing ataxia of the trunk. Sit to supine to prone with Supervision on flat mat table. Prone to quadruped with min A for elevating hips and trunk. Once in quadruped pt able to perform alt UE lifts then alt LE lifts. However, pt reports onset of dizziness in this position, returned to prone for rest break where symptoms subside. Prone to tall-kneeling on mat table with use of therapy ball for dynamic UE support. Pt able to take "sidesteps" L/R on mat table in tall-kneeling position, 4 x 5 ft each direction while manuevering therapy ball L and R, min A for balance. Sidesteps around mat table with UE WBing on table and reaching outside BOS to assist therapist with wiping down table, CGA for balance. Sit to stand 2 x 5 reps with no UE support and Supervision with orange theraband around hips for increased challenge with activation of hip extensors. Pt exhibits decreased weight shift onto LLE with sit to stand transfer, able to correct with verbal cueing. Pt left seated in  recliner in room with needs in reach, chair alarm in place at end of session.  Therapy Documentation Precautions:  Precautions Precautions: Fall Precaution Comments: RUE and RLE ataxia Restrictions Weight Bearing Restrictions: No    Therapy/Group: Individual Therapy   Excell Seltzer, PT, DPT, CSRS  11/03/2020, 12:01 PM

## 2020-11-03 NOTE — Progress Notes (Signed)
Occupational Therapy Session Note  Patient Details  Name: Pamela Mason MRN: [id number] Date of Birth: [birth year]  Today's Date: 11/03/2020 OT Individual Time: 3361-2244 OT Individual Time Calculation (min): 27 min    Short Term Goals: Week 2:  OT Short Term Goal 1 (Week 2): Pt will complete all aspects of UB dressing with SUP. OT Short Term Goal 2 (Week 2): Pt will complete transfers to all appropriate surfaces with SUP and LRAD. OT Short Term Goal 3 (Week 2): Pt will bathe with setup while seated in shower. OT Short Term Goal 4 (Week 2): Pt will complete all aspects of toileting with SUP.  Skilled Therapeutic Interventions/Progress Updates:    Pt received supine in bed, agreeable to OT, reporting no pain. Session focus on ADLs, functional mobility, dynamic standing balance/tolerance, and memory/recall skills. Sup<>sit and sit<>stand with RW close spvsn. Pt donned/doffed socks/shoes with set up A EOB. Ambulated with RW ~500' with CGA-close spvsn due to L foot occasionally dragging and balance deficits. Pt engaged in dynamic standing balance activity requiring reaching outside BOS with no UE supported using BITS. Pt tolerated 15 minutes standing task without rest break and no overt LOB when reaching. Pt engaged in [employer] task; demoed difficulties with recalling set of 5 or more items, but higher accuracy with sets of 4 items with no vc's. Pt required no vc's to locate items in all visual fields including left; however, occasionally took longer to locate items in far left side of screen. Pt education on implications for home safety/fall prevention. Pt remained supine in bed, alarm set, all needs met.  Therapy Documentation Precautions:  Precautions Precautions: Fall Precaution Comments: RUE and RLE ataxia Restrictions Weight Bearing Restrictions: No  Pain: Pain Assessment Pain Scale: 0-10 Pain Score: 0-No pain   Therapy/Group: Individual Therapy  Mellissa Kohut 11/03/2020,  4:14 PM

## 2020-11-04 ENCOUNTER — Ambulatory Visit: Payer: Medicare Other

## 2020-11-04 NOTE — Progress Notes (Signed)
PROGRESS NOTE   Subjective/Complaints:   PT in room , discussed ambulation.  Pt will need walker at home for safety but is working on amb without walker with PT ROS:  Pt denies SOB, abd pain, CP, N/V/C/D, and vision changes   Objective:   No results found. No results for input(s): WBC, HGB, HCT, PLT in the last 72 hours.  No results for input(s): NA, K, CL, CO2, GLUCOSE, BUN, CREATININE, CALCIUM in the last 72 hours.   Intake/Output Summary (Last 24 hours) at 11/04/2020 0816 Last data filed at 11/04/2020 3151 Gross per 24 hour  Intake 522 ml  Output --  Net 522 ml         Physical Exam: Vital Signs Blood pressure 98/65, pulse (!) 55, temperature 98.1 F (36.7 C), temperature source Oral, resp. rate 14, height 5\' 4"  (1.626 m), weight 55.8 kg, SpO2 97 %.  General: No acute distress Mood and affect are appropriate Heart: Regular rate and rhythm no rubs murmurs or extra sounds Lungs: Clear to auscultation, breathing unlabored, no rales or wheezes Abdomen: Positive bowel sounds, soft nontender to palpation, nondistended Extremities: No clubbing, cyanosis, or edema  Skin:    General: Skin is warm and dry. Neurological:    Mental Status: She is alert and oriented to person, place, and time.    Motor: Weakness present.    Coordination: Coordination abnormal. Finger-Nose-Finger Test abnormal and Heel to [employer] abnormal.    Gait: Gait abnormal.Amb with walker , PT, + Left foot drop requiring cueing    Comments: Motor 4/5 left delt bi, trik grip HF, KE, 4-/5 ADF Left UE ataxia , + dysdiadochokinesis LUE and LLE , reduced fine motor LUE    Assessment/Plan: 1. Functional deficits which require 3+ hours per day of interdisciplinary therapy in a comprehensive inpatient rehab setting. Physiatrist is providing close team supervision and 24 hour management of active medical problems listed below. Physiatrist and rehab  team continue to assess barriers to discharge/monitor patient progress toward functional and medical goals  Care Tool:  Bathing    Body parts bathed by patient: Right arm, Left arm, Chest, Abdomen, Right upper leg, Left upper leg, Right lower leg, Left lower leg, Face   Body parts bathed by helper: Front perineal area, Buttocks     Bathing assist Assist Level: Minimal Assistance - Patient > 75%     Upper Body Dressing/Undressing Upper body dressing   What is the patient wearing?: Pull over shirt    Upper body assist Assist Level: Contact Guard/Touching assist    Lower Body Dressing/Undressing Lower body dressing      What is the patient wearing?: Pants     Lower body assist Assist for lower body dressing: Contact Guard/Touching assist     Toileting Toileting    Toileting assist Assist for toileting: Contact Guard/Touching assist     Transfers Chair/bed transfer  Transfers assist     Chair/bed transfer assist level: Contact Guard/Touching assist     Locomotion Ambulation   Ambulation assist      Assist level: Moderate Assistance - Patient 50 - 74% Assistive device: No Device Max distance: 150'   Walk 10 feet  activity   Assist     Assist level: Moderate Assistance - Patient - 50 - 74% Assistive device: No Device   Walk 50 feet activity   Assist Walk 50 feet with 2 turns activity did not occur: Safety/medical concerns  Assist level: Moderate Assistance - Patient - 50 - 74% Assistive device: No Device    Walk 150 feet activity   Assist Walk 150 feet activity did not occur: Safety/medical concerns  Assist level: Moderate Assistance - Patient - 50 - 74% Assistive device: No Device    Walk 10 feet on uneven surface  activity   Assist Walk 10 feet on uneven surfaces activity did not occur: Safety/medical concerns         Wheelchair     Assist Will patient use wheelchair at discharge?: No             Wheelchair 50 feet  with 2 turns activity    Assist            Wheelchair 150 feet activity     Assist          Blood pressure 98/65, pulse (!) 55, temperature 98.1 F (36.7 C), temperature source Oral, resp. rate 14, height 5\' 4"  (1.626 m), weight 55.8 kg, SpO2 97 %.  Medical Problem List and Plan: 1.  Decrease self care and mobility  secondary to Right subcortical infarct             -patient may  shower             -ELOS/Goals: 7/7 24/7 sup goals, team conf in am  F/u neuro 4 wks post rehab dc  Continue PT, OT and SLP 2.  Impaired mobility, ambulating 450 feet, d/c Lovenox             -antiplatelet therapy: ASA 81mg , and clopidigrel75mg  x 3 wk then plavix alone 3. Pain Management: Tylenol prn 4. Depression: Continue Sertraline 100mg  daily             -antipsychotic agents: none 5. Neuropsych: This patient is capable of making decisions on her own behalf would need family assistance for more complex financial and medical decisions. 6. Skin/Wound Care: monitor 7. Fluids/Electrolytes/Nutrition: low sodium HDPD 8.  HTN: continue lisinopril 10mg   6/27 controlled  Vitals:   11/03/20 1949 11/04/20 0607  BP: 100/67 98/65  Pulse: 62 (!) 55  Resp: 14 14  Temp: 98.2 F (36.8 C) 98.1 F (36.7 C)  SpO2: 97% 97%    9.  Mild dementia: continue Galantamine 8mg  per day 10.  HLD simvastatin 40mg  daily 11.  Vit D deficiency Ergocalciferol 50,000U once per week for 7 weeks.  12.  B12 supplemkent 583mcg daily 13. Constipation colace 1 po qd  Last BM 6/26        LOS: 9 days A FACE TO FACE EVALUATION WAS PERFORMED  Charlett Blake 11/04/2020, 8:16 AM

## 2020-11-04 NOTE — Progress Notes (Signed)
Occupational Therapy Session Note  Patient Details  Name: Pamela Mason MRN: [id number] Date of Birth: 1945/12/22  Today's Date: 11/04/2020 OT Individual Time: 1004-1101 OT Individual Time Calculation (min): 57 min    Short Term Goals: Week 2:  OT Short Term Goal 1 (Week 2): Pt will complete all aspects of UB dressing with SUP. OT Short Term Goal 2 (Week 2): Pt will complete transfers to all appropriate surfaces with SUP and LRAD. OT Short Term Goal 3 (Week 2): Pt will bathe with setup while seated in shower. OT Short Term Goal 4 (Week 2): Pt will complete all aspects of toileting with SUP.  Skilled Therapeutic Interventions/Progress Updates:    Session 1: (1004-1101)  Pt completed functional mobility from her room down to the therapy gym to start session with min guar assist using the RW for support.  She then worked on weightbearing for the LUE in quadriped, having to support herself with the LUE while completing peg board with the RUE.  She also worked on walking her hands forward until she reached prone position and then worked on walking them back in order reach quadriped again with min assist.  Next, had her transition back to sitting where she worked on LUE functional reach and in-hand manipulation picking up 2" pegs with the LUE, one at a time, and placing in the wooden peg board.  Progressed to having her pick them up and manipulate them from her fingertips to palm and back, with 2-3 in hand, and then place them in the board.  Occasional drops noted but she was successful 75% of the time.  Finished session with work on standing and taking the pegs out and placing them back into the container by reaching down and to the left, in order to increase weightbearing over the LLE.  Noted increased left knee pain with increased flexion as well as crepitus.  Returned to the wheelchair at conclusion of task with min assist for transfer without an assistive device.  Pt was pushed back to the room  where she transferred to the recliner at min guard assist with her son present.  Discussed progress with pt and son the last few days.  She was left with the call button and phone in reach with safety alarm pad in place.    Session 2: (1301-1330)  Pt worked on [employer] coordination and functional reaching with the LUE from the room.  Had her sitting and standing with use of the resistive clothespins.  She was able to place all colors from yellow to black.  Incorporated weightshifting to the left in standing in order to place the clothespins back into the container.  She exhibits some resistance to weightshifting toward her impaired side.  Slight ataxia noted with reaching using the LUE, but much improved from eval.  Finished with pt resting in the recliner with the call button and phone in reach.    Therapy Documentation Precautions:  Precautions Precautions: Fall Precaution Comments: RUE and RLE ataxia Restrictions Weight Bearing Restrictions: No  Pain: Pain Assessment Pain Scale: 0-10 Pain Score: 0-No pain ADL: See Care Tool Section for some details of mobility and selfcare   Therapy/Group: Individual Therapy  Abid Bolla OTR/L 11/04/2020, 3:56 PM

## 2020-11-04 NOTE — Progress Notes (Signed)
Physical Therapy Weekly Progress Note  Patient Details  Name: Pamela Mason MRN: [id number] Date of Birth: 09-15-1945  Beginning of progress report period: October 27, 2020 End of progress report period: November 04, 2020  Today's Date: 11/04/2020 PT Individual Time: 0807-0902 and 5638-7564 PT Individual Time Calculation (min): 55 min and 30 min   Patient has met 3 of 3 short term goals.  Pamela Mason is demonstrating excellent progression with therapy at this time. She is performing supine<>sit with supervision, sit<>stands with CGA not using AD, and stand pivot transfers with CGA/min assist not using AD. She is ambulating up to 261ft, no AD to facilitate normalized gait mechanics, with skilled min/mod assist for balance - she continues to have impaired coordination and strength in LLE impacting gait mechanics and balance. She is performing 4 stair navigation using B HRs with min assist. She continues to benefit from skilled physical therapist in CIR setting to further advance her independence with functional mobility and decrease risk for falls.   Patient continues to demonstrate the following deficits muscle weakness, decreased cardiorespiratoy endurance, impaired timing and sequencing, unbalanced muscle activation, and decreased coordination, decreased midline orientation, decreased awareness, decreased problem solving, decreased memory, and delayed processing,  , and decreased sitting balance, decreased standing balance, decreased postural control, hemiplegia, and decreased balance strategies and therefore will continue to benefit from skilled PT intervention to increase functional independence with mobility.  Patient progressing toward long term goals..  Continue plan of care.  PT Short Term Goals Week 1:  PT Short Term Goal 1 (Week 1): Patient will complete sit <> stand with LRAD and CGA PT Short Term Goal 1 - Progress (Week 1): Met PT Short Term Goal 2 (Week 1): Patient will completed bed<>  wc transfer with LRAD and MinA PT Short Term Goal 2 - Progress (Week 1): Met PT Short Term Goal 3 (Week 1): Patient will ambulate >11ft with LRAD and MinA PT Short Term Goal 3 - Progress (Week 1): Met Week 2:  PT Short Term Goal 1 (Week 2): = to LTGs based on ELOS  Skilled Therapeutic Interventions/Progress Updates:  Ambulation/gait training;DME/adaptive equipment instruction;Psychosocial support;UE/LE Strength taining/ROM;Balance/vestibular training;Functional electrical stimulation;Skin care/wound management;UE/LE Coordination activities;Cognitive remediation/compensation;Functional mobility training;Splinting/orthotics;Visual/perceptual remediation/compensation;Community reintegration;Neuromuscular re-education;Stair training;Wheelchair propulsion/positioning;Discharge planning;Pain management;Therapeutic Activities;Disease management/prevention;Patient/family education;Therapeutic Exercise   Session 1: Pt received sitting in recliner and eager to participate in therapy session. Donned socks and tennis shoes min assist for time management. MD in/out for morning assessment. R stand pivot recliner>w/c, no AD, with CGA/min assist for steadying/balance - cuing for upright posture and weight shifting to allow a step. Transported to/from gym in w/c for time management and energy conservation. Sit<>stands, no AD, with CGA throughout session - cuing for L LE positioning to promote increased WBing for NMR.  Gait training ~278ft, no AD, min assist at hips for balance and to facilitate weight shifting and increased L glute/hip abductor activation in stance to improve L stance stability - continues to have decreased L LE foot clearance during swing (lacks sufficient knee flexion with excessive hip adduction intermittently) and some impaired coordination as well as guarded trunk posturing with arms held in abducted positioning - cuing throughout for improvement.  Stair navigation training working on L LE NMR via  ascending/descending 4 steps with B HR support - cuing for reciprocal stepping pattern - min assist for balance, guarding/blocking L knee on descent.  Repeated sit<>stands to/from EOM, limited UE support, with R LE on block to promote increased L LE WBing  x5 reps with CGA/min assist for steadying - cuing for increased L hip extension - pt reports some L lateral knee pain/discomfort therefore transitioned task.  Supine<>sit on mat with supervision. Supine on mat L LE biased bridging 2x15reps - started with R LE extended then progressed to R LE flexed up off of mat - cuing for proper L LE alignment to increase hip and glute activation.  Transitioned back to sit<>stands with mirror feedback and external target to promote increased L LE weight shifting 2x10 reps.   Gait training ~225f, no AD, with min assist primarily then mod assist for balance when turning - continued facilitation at hips to promote R/L weight shift onto stance limb - cuing for increased L LE foot clearance during swing, improved L LE foot coordination, has R/L lateral trunk leaning during stance, and continued guarded posture with arms abducted away from sides, notice L LE adducts/scissors when turning - cuing and facilitation for improvement throughout.   Stanidng heel raises x10reps no UE support CGA/min assist for steadying. Transported back up to room. L stand pivot w/c>recliner, no AD, CGA. Pt left sitting in recliner with needs in reach and chair alarm on.    Session 2: Pt received supine in bed, awake and agreeable to therapy session. Supine>sitting R EOB, HOB slightly elevated, supervision. Sitting EOB donned shoes max assist for time management. Sit<>stands, no AD, CGA for steadying throughout session - cuing for L foot positioning to promote WBing, pt tends to kick it out in front of her too far. Gait training ~2555f no AD, on/off elevator and down to main therapy gym with min/mod assist for balance - continued  cuing/facilitation at hips for R/L weight shifting onto stance limb and for increased L LE foot clearance  - continues to demo gait impairments of excessive R/L trunk sway, guarded posturing with arms abducted, impaired L LE coordination with heavy foot landing, and poor L foot clearance. Standing<>tall kneeling on mat using BUE support on bench with CGA/min assist for balance/safety. In tall kneeling, performed dynamic arm movements focusing on trunk/hip/pelvis muscle activation to maintain balance/stability - started with diagonal 3000 Gram weighted ball movements in PNF patterns but pt only able to tolerate movement through small range without significant LOB, requiring mod assist to recover with attempts at larger movements  - transitioned to no weight and larger amplitude arm movements with pt progressing from requiring min assist to CGA. Gait training ~25064fack to room, no AD, with min progressing to mod assist towards end due to fatigue - pt demos improved trunk control and balance with decreased R/L postural sway and more relaxed UE positioning at beginning of walk; however, continues to demo above gait impairments. Pt left seated in recliner with needs in reach and chair alarm on.  Therapy Documentation Precautions:  Precautions Precautions: Fall Precaution Comments: RUE and RLE ataxia Restrictions Weight Bearing Restrictions: No   Pain:  Session 1: Reports intermittent L lateral knee discomfort with certain activities - modified as described above.  Session 2: Denies pain during session.  Therapy/Group: Individual Therapy  CarTawana ScalePT, DPT, CSRS 11/04/2020, 7:49 AM

## 2020-11-04 NOTE — Progress Notes (Signed)
Speech Language Pathology Daily Session Note  Patient Details  Name: Pamela Mason MRN: [id number] Date of Birth: 12-30-1945  Today's Date: 11/04/2020 SLP Individual Time: 1130-1200 SLP Individual Time Calculation (min): 30 min  Short Term Goals: Week 2: SLP Short Term Goal 1 (Week 2): Pt will demonstrate selective attention to functional tasks within mildly distracting environment with Min A verbal cues. SLP Short Term Goal 2 (Week 2): Pt will demonstrate recall of novel, daily information with min A verbal cues for use of visual aids. SLP Short Term Goal 3 (Week 2): Pt will demonstrate basic-mildly complex problem solving skills with min A verbal cues. SLP Short Term Goal 4 (Week 2): Pt will self-monitor and self-correct functional errors with supervision A verbal cues.  Skilled Therapeutic Interventions: Skilled ST intervention performed with focus on cognitive goals. SLP facilitated functional short-term recall and reasoning task with Mod A verbal cues. Patient recalled items on a grocery list following discussion and training on memory strategies including 1. Repetition (verbal, visual, written) 2. Visualization, 3. Association of similar characteristics. Patient recalled 4/10 items without cues following short term delay of 5 minutes; 5/10 with additional processing time; 9/10 with semantic cues. Required mod-to-max verbal cues to identify and self correct errors. SLP provided reminders for patient to refer to memory notebook, and encouraged patient to write in book to summarize ST session instead of therapist so that it is in her own words to facilitate more effective recall. Patient was left in chair with alarm activated and needs within reach. Continue per ST POC.     Pain Pain Assessment Pain Scale: 0-10 Pain Score: 0-No pain  Therapy/Group: Individual Therapy  [name] 11/04/2020, 12:13 PM

## 2020-11-05 ENCOUNTER — Telehealth: Payer: Self-pay | Admitting: Cardiology

## 2020-11-05 MED ORDER — ASPIRIN EC 81 MG PO TBEC
81.0000 mg | DELAYED_RELEASE_TABLET | Freq: Every day | ORAL | Status: AC
  Administered 2020-11-06 – 2020-11-13 (×8): 81 mg via ORAL
  Filled 2020-11-05 (×8): qty 1

## 2020-11-05 NOTE — Discharge Instructions (Addendum)
Inpatient Rehab Discharge Instructions  Pamela Mason [facility] Discharge date and time:  11/13/20  Activities/Precautions/ Functional Status: Activity: no lifting, driving, or strenuous exercise till cleared by MD Diet: cardiac diet Wound Care: none needed   Functional status:  ___ No restrictions     ___ Walk up steps independently _X__ 24/7 supervision/assistance   ___ Walk up steps with assistance ___ Intermittent supervision/assistance  ___ Bathe/dress independently ___ Walk with walker     _X__ Bathe/dress with assistance ___ Walk Independently    ___ Shower independently ___ Walk with assistance    ___ Shower with assistance _X__ No alcohol     ___ Return to work/school ________   Special Instructions:    My questions have been answered and I understand these instructions. I will adhere to these goals and the provided educational materials after my discharge from the hospital.  Patient/Caregiver Signature _______________________________ Date __________  Clinician Signature _______________________________________ Date __________  Please bring this form and your medication list with you to all your follow-up doctor's appointments.

## 2020-11-05 NOTE — Progress Notes (Signed)
Speech Language Pathology Daily Session Note  Patient Details  Name: Pamela Mason MRN: [id number] Date of Birth: Jun 09, 1945  Today's Date: 11/05/2020 SLP Individual Time: 1345-1430 SLP Individual Time Calculation (min): 45 min  Short Term Goals: Week 2: SLP Short Term Goal 1 (Week 2): Pt will demonstrate selective attention to functional tasks within mildly distracting environment with Min A verbal cues. SLP Short Term Goal 2 (Week 2): Pt will demonstrate recall of novel, daily information with min A verbal cues for use of visual aids. SLP Short Term Goal 3 (Week 2): Pt will demonstrate basic-mildly complex problem solving skills with min A verbal cues. SLP Short Term Goal 4 (Week 2): Pt will self-monitor and self-correct functional errors with supervision A verbal cues.  Skilled Therapeutic Interventions: Skilled ST treatment performed with focus on cognitive goals. Facilitated calendar planning task involving selective attention, problem solving, and planning task with supervision A verbal cues for planning/organization within mildly distracting environment (television was playing in the background). Patient was able to attend to task for duration of session with minimal re-direction. Patient required supervision A verbal cues to identify error x1 in which she was then able to independently repair. Provided instruction on other compensatory memory strategies (calendar, memory notebook, daily planner). Patient verbalized understanding through teach back. Patient wrote in memory notebook at conclusion of session. Patient was left in chair with alarm activated and needs within reach. Continue per ST POC.    Pain Pain Assessment Pain Scale: 0-10 Pain Score: 0-No pain Faces Pain Scale: No hurt  Therapy/Group: Individual Therapy  [name] 11/05/2020, 2:04 PM

## 2020-11-05 NOTE — Progress Notes (Signed)
Occupational Therapy Session Note  Patient Details  Name: Pamela Mason MRN: [id number] Date of Birth: 1945-06-28  Today's Date: 11/05/2020 OT Individual Time: 2831-5176 OT Individual Time Calculation (min): 60 min    Short Term Goals: Week 2:  OT Short Term Goal 1 (Week 2): Pt will complete all aspects of UB dressing with SUP. OT Short Term Goal 2 (Week 2): Pt will complete transfers to all appropriate surfaces with SUP and LRAD. OT Short Term Goal 3 (Week 2): Pt will bathe with setup while seated in shower. OT Short Term Goal 4 (Week 2): Pt will complete all aspects of toileting with SUP.  Skilled Therapeutic Interventions/Progress Updates:    Pt in recliner to start session with agreement to donn her shoes and go work in [employer].  She declined wanting to shower this pm.  She was able to donn her shoes with setup, increased time for tying the left shoe, however she did complete it on second attempt.  She ambulated with the RW from her room down to the ortho gym with min guard assist.  Min instructional cueing to maintain upright posture and not try to step outside of the RW when attempting to push the elevator button to the left.  Once in the gym, focused on LUE strengthening and coordination.  She was able to complete 3 sets on the UE ergonometer in sitting.  The first set two sets were for 3 mins with the last on for 2 mins.  Resistance was set on level 7 for all sets, with the 1st being completed using BUEs and the last 2 with isolated use of the LUE.  The last set of 2 mins was completed peddling backwards with decreased ability to  maintain elbow extension with each rotation.  Next, had her transition to the therapy mat where she completed 3 sets of 10 reps shoulder flexion using a 5 lb dowel rod.  Also had her complete 2 intervals of approximately 1 minute each for volleyball using the beach ball and having her hit it back to the therapist while holding the dowel rod.  Finished by having  her catch and toss the small beach ball with BUEs.  She was able to catch it with her hands with 90% accuracy.  Transferred back to the wheelchair where she was taken back to the room to complete session.  She transferred to the recliner with min assist and was left with the call button and phone in reach.      Therapy Documentation Precautions:  Precautions Precautions: Fall Precaution Comments: mild RUE and RLE ataxia Restrictions Weight Bearing Restrictions: No  Pain: Pain Assessment Pain Scale: Faces Pain Score: 0-No pain ADL: See Care Tool Section for some details of mobility and selfcare   Therapy/Group: Individual Therapy  Dorothyann Mourer OTR/L 11/05/2020, 4:41 PM

## 2020-11-05 NOTE — Progress Notes (Signed)
Physical Therapy Session Note  Patient Details  Name: Pamela Mason MRN: [id number] Date of Birth: Jan 30, 1946  Today's Date: 11/05/2020 PT Individual Time: 1710-1737 PT Individual Time Calculation (min): 27 min   Short Term Goals: Week 2:  PT Short Term Goal 1 (Week 2): = to LTGs based on ELOS  Skilled Therapeutic Interventions/Progress Updates:    Pt received sitting in recliner and agreeable to therapy session. Sit<>stands, no AD, with CGA for steadying throughout session - demos improving L LE positioning to increase WBing during transfers. Gait training ~255ft, no AD, down to main therapy gym including on/off elevators with min assist for balance - pt demos improving balance with turning; however, when having dual-challenge (ex: pushing elevator button while turning) then pt will not clear L LE fully when stepping requiring up to mod assist for balance - continues to have decreased L LE foot clearance, impaired L LE coordination with excessive hip adduction during swing causing slight scissoring every so often, decreased pelvic rotation, and guarded trunk posturing with arms abducted. Donned maxi-sky harness.   Dynamic gait training in maxi-sky harness without UE support and close supervision for safety including:  - forward gait  - side stepping, demos improved  LLE coordination and step length compared to previously seen with this therapist - backwards walking, able to advance to reciprocal stepping pattern with increased speed and coordination of movement compared to previously seen with this therapist  Gait training back to room as described above with continued cuing/facilitation for improved gait mechanics and balance. Pt left sitting in recliner with needs in reach, chair alarm on, and meal tray set-up.    Therapy Documentation Precautions:  Precautions Precautions: Fall Precaution Comments: RUE and RLE ataxia Restrictions Weight Bearing Restrictions: No   Pain: No  reports of pain throughout session.  Therapy/Group: Individual Therapy  Tawana Scale , PT, DPT, CSRS 11/05/2020, 3:34 PM

## 2020-11-05 NOTE — Progress Notes (Signed)
PROGRESS NOTE   Subjective/Complaints: Cont of bowel and bladder no complaints today  ROS:  Pt denies SOB, abd pain, CP, N/V/C/D, and vision changes   Objective:   No results found. No results for input(s): WBC, HGB, HCT, PLT in the last 72 hours.  No results for input(s): NA, K, CL, CO2, GLUCOSE, BUN, CREATININE, CALCIUM in the last 72 hours.   Intake/Output Summary (Last 24 hours) at 11/05/2020 0931 Last data filed at 11/05/2020 0700 Gross per 24 hour  Intake 774 ml  Output --  Net 774 ml         Physical Exam: Vital Signs Blood pressure (!) 93/59, pulse (!) 59, temperature 98.1 F (36.7 C), resp. rate 18, height 5' 4"  (1.626 m), weight 55.8 kg, SpO2 99 %.  General: No acute distress Mood and affect are appropriate Heart: Regular rate and rhythm no rubs murmurs or extra sounds Lungs: Clear to auscultation, breathing unlabored, no rales or wheezes Abdomen: Positive bowel sounds, soft nontender to palpation, nondistended Extremities: No clubbing, cyanosis, or edema Skin: No evidence of breakdown, no evidence of rash Neurological:    Mental Status: She is alert and oriented to person, place, and time.    Motor: Weakness present.    Coordination: Coordination abnormal. Finger-Nose-Finger Test abnormal and Heel to [employer] abnormal.    Gait: Gait abnormal.Amb with walker , PT, + Left foot drop requiring cueing    Comments: Motor 4/5 left delt bi, trik grip HF, KE, 4-/5 ADF Left UE ataxia , + dysdiadochokinesis LUE and LLE , reduced fine motor LUE    Assessment/Plan: 1. Functional deficits which require 3+ hours per day of interdisciplinary therapy in a comprehensive inpatient rehab setting. Physiatrist is providing close team supervision and 24 hour management of active medical problems listed below. Physiatrist and rehab team continue to assess barriers to discharge/monitor patient progress toward functional  and medical goals  Care Tool:  Bathing    Body parts bathed by patient: Right arm, Left arm, Chest, Abdomen, Right upper leg, Left upper leg, Right lower leg, Left lower leg, Face   Body parts bathed by helper: Front perineal area, Buttocks     Bathing assist Assist Level: Minimal Assistance - Patient > 75%     Upper Body Dressing/Undressing Upper body dressing   What is the patient wearing?: Pull over shirt    Upper body assist Assist Level: Contact Guard/Touching assist    Lower Body Dressing/Undressing Lower body dressing      What is the patient wearing?: Pants     Lower body assist Assist for lower body dressing: Contact Guard/Touching assist     Toileting Toileting    Toileting assist Assist for toileting: Contact Guard/Touching assist     Transfers Chair/bed transfer  Transfers assist     Chair/bed transfer assist level: Contact Guard/Touching assist     Locomotion Ambulation   Ambulation assist      Assist level: Contact Guard/Touching assist Assistive device: Walker-rolling Max distance: 200'   Walk 10 feet activity   Assist     Assist level: Minimal Assistance - Patient > 75% Assistive device: No Device   Walk 50 feet activity  Assist Walk 50 feet with 2 turns activity did not occur: Safety/medical concerns  Assist level: Minimal Assistance - Patient > 75% Assistive device: No Device    Walk 150 feet activity   Assist Walk 150 feet activity did not occur: Safety/medical concerns  Assist level: Moderate Assistance - Patient - 50 - 74% Assistive device: No Device    Walk 10 feet on uneven surface  activity   Assist Walk 10 feet on uneven surfaces activity did not occur: Safety/medical concerns         Wheelchair     Assist Will patient use wheelchair at discharge?: No             Wheelchair 50 feet with 2 turns activity    Assist            Wheelchair 150 feet activity     Assist           Blood pressure (!) 93/59, pulse (!) 59, temperature 98.1 F (36.7 C), resp. rate 18, height 5' 4"  (1.626 m), weight 55.8 kg, SpO2 99 %.  Medical Problem List and Plan: 1.  Decrease self care and mobility  secondary to Right subcortical infarct             -patient may  shower             -ELOS/Goals: 7/7 24/7 sup goals, Team conference today please see physician documentation under team conference tab, met with team  to discuss problems,progress, and goals. Formulized individual treatment plan based on medical history, underlying problem and comorbidities.  F/u neuro 4 wks post rehab dc  Continue PT, OT and SLP 2.  Impaired mobility, ambulating 450 feet, d/c Lovenox             -antiplatelet therapy: ASA 38m, and [id number] 3 wk then plavix alone 3. Pain Management: Tylenol prn 4. Depression: Continue Sertraline 10053maily             -antipsychotic agents: none 5. Neuropsych: This patient is capable of making decisions on her own behalf would need family assistance for more complex financial and medical decisions. 6. Skin/Wound Care: monitor 7. Fluids/Electrolytes/Nutrition: low sodium HDPD 8.  HTN: continue lisinopril 68m68m/29 controlled  Vitals:   11/04/20 1256 11/04/20 1926  BP: 92/62 (!) 93/59  Pulse: 65 (!) 59  Resp: 16 18  Temp: 97.8 F (36.6 C) 98.1 F (36.7 C)  SpO2: 100% 99%    9.  Mild dementia: continue Galantamine 8mg 58m day 10.  HLD simvastatin 40mg 31my 11.  Vit D deficiency Ergocalciferol 50,000U once per week for 7 weeks.  12.  B12 supplemkent 500mcg 68my 13. Constipation colace 1 po qd  Last BM 6/28        LOS: 10 days A FACE TO FACE EVALUATION WAS PERFORMED  Mikhail Hallenbeck Charlett Blake022, 9:31 AM

## 2020-11-05 NOTE — Patient Care Conference (Signed)
Inpatient RehabilitationTeam Conference and Plan of Care Update Date: 11/05/2020   Time: 10:31 AM    Patient Name: Pamela Mason      Medical Record Number: [id number]  Date of Birth: 08/24/1945 Sex: Female         Room/Bed: 5C09C/5C09C-01 Payor Info: Payor: MEDICARE / Plan: MEDICARE PART A AND B / Product Type: *No Product type* /    Admit Date/Time:  10/26/2020  2:27 PM  Primary Diagnosis:  Subcortical infarction [facility])  Hospital Problems: Principal Problem:   Subcortical infarction [facility])    Expected Discharge Date: Expected Discharge Date: 11/13/20  Team Members Present: Physician leading conference: Dr. Alysia Penna Care Coodinator Present: Dorien Chihuahua, RN, BSN, CRRN;Christina [place], [place] Nurse Present: Dorien Chihuahua, RN PT Present: Page Spiro, PT OT Present: Clyda Greener, OT SLP Present: Charolett Bumpers, SLP PPS Coordinator present : Gunnar Fusi, SLP     Current Status/Progress Goal Weekly Team Focus  Bowel/Bladder   Continent B/B LBM 11/04/20  Patient will remain continent  Assess qshift and assist prn to bathroom   Swallow/Nutrition/ Hydration             ADL's   Supervison for UB selfcare with min guard for LB selfcare and transfers with the RW.  Min to mod for transfers to the bathroom without use of an assistive device.  LUE ataxia but improved from eval  supervison overall  selfcare retraining, transfer training, DME education, balance retraining, therapeutic exercise, pt education   Mobility   supervision bed mobility, CGA sit<>stands, CGA/min assist stand pivot transfers, min/mod assist gait up to 263ft without AD, min assist 4 steps using BHRs  supervision overall at ambulatory level  activity tolerance, L LE NMR, pt/family education, transfer training, dynamic standing balance, dynamic gait training, coordination   Communication             Safety/Cognition/ Behavioral Observations  Min A  Sup A - may upgrade to mod I  short-term recall, use  of compensatory aids, error awareness, and mildly complex problem solving.   Pain   Denies pain  Pain score will remain below 3  Assess qshift and prn   Skin   Skin intact  Skin will remain intact  assess skin q shift and prn     Discharge Planning:  Daughters plan for patient to d/c to [place] (ILF)   Team Discussion: Patient is more interactive however continue to note incoordination with ambulation but ataxia left UE improved. Requires cues for hand placement with use of walker and min assist. CGA for sit - stand without a walker. Ambulation affected more by hip alignment off which affects balance. Ataxia left LE causes scissoring gait and narrow base of support. Patient on target to meet rehab goals: yes, currently supervision for upper body bathing and dressing and min guard for lower body care.  Min assist for communication with goals for supervision at discharge.   *See Care Plan and progress notes for long and short-term goals.   Revisions to Treatment Plan:  Working on  short term recall, error awareness, mildly complex problem solving  Teaching Needs: Transfers, safety, medications, etc.  Current Barriers to Discharge: Decreased caregiver support and Home enviroment access/layout  Possible Resolutions to Barriers: Family education DME recommended     Medical Summary Current Status: Left UE adn LE ataxia, BP controlled , cont of bowel and bladder  Barriers to Discharge: Other (comments)   Possible Resolutions to [employer] Focus: incease safety , AFO consult with  orthotist   Continued Need for Acute Rehabilitation Level of Care: The patient requires daily medical management by a physician with specialized training in physical medicine and rehabilitation for the following reasons: Direction of a multidisciplinary physical rehabilitation program to maximize functional independence : Yes Medical management of patient stability for increased activity during  participation in an intensive rehabilitation regime.: Yes Analysis of laboratory values and/or radiology reports with any subsequent need for medication adjustment and/or medical intervention. : Yes   I attest that I was present, lead the team conference, and concur with the assessment and plan of the team.   Dorien Chihuahua B 11/05/2020, 2:02 PM

## 2020-11-05 NOTE — Progress Notes (Signed)
Patient ID: Pamela Mason, female   DOB: March 22, 1946, 75 y.o.   MRN: [id number] Team Conference Report to Patient/Family  Team Conference discussion was reviewed with the patient and caregiver, including goals, any changes in plan of care and target discharge date.  Patient and caregiver express understanding and are in agreement.  The patient has a target discharge date of 11/13/20.  SW met with pt and daughter [name]) at bedside. Provided conference. Pt very happy with her stay here. No additional questions or concerns sw will cont to follow up.  Dyanne Iha 11/05/2020, 2:19 PM

## 2020-11-05 NOTE — Telephone Encounter (Signed)
New Message:     Pt missed her appt for her Wound Check on yesterday(11-04-20), she is in the hospital. . Daughter wanted to know if they will check that while she is in the hospital?

## 2020-11-05 NOTE — Telephone Encounter (Signed)
Called to advise that I will forward a message to the covering PA to see if she is able to go see the patient in the hospital for follow up post implant. Appreciative for call.   Carelink checked 11/05/20 and no alerts triggered. Message sent to [place], [place] to see if she is able to see patient while still in the hospital post follow up IL[facility].

## 2020-11-05 NOTE — Progress Notes (Signed)
Physical Therapy Session Note  Patient Details  Name: Pamela Mason MRN: [id number] Date of Birth: 04/03/1946  Today's Date: 11/05/2020 PT Individual Time: 0806-0905 PT Individual Time Calculation (min): 59 min   Short Term Goals: Week 2:  PT Short Term Goal 1 (Week 2): = to LTGs based on ELOS  Skilled Therapeutic Interventions/Progress Updates:    Pt supine in the bed and eager to start physical therapy at time of session. Transition from supine to sitting EOB supervision. Pt demonstrated dynamic balance with weight shifting EOB while donning socks and shoes supervision assist (therapist tied shoes for time management).   Pt walked to wheelchair with turn towards the right CGA and verbal cuing for increased step length with LLE to prevent small BOS and catching LLE on heel of RLE. Therapist wheeled pt to 4w gym for time conservation.   Sit<>Stands with CGA and verbal cuing for LLE placement to facilitate equal weight bearing during transition.   Bridging 3x10 on mat table. Pt cued for toes up and weight through the heels to encourage increased activation of the hamstrings and glutes. Tactile cuing to encourage knee alignment and prevent adduction/genu valgus throughout bridging.   Supine clamshells 1x10 with theraband. Noted R>L activation, requiring verbal and tactile cues for equal activation. Transitioned to right sidelying clamshells 2x10 LLE. Verbal and manual cuing for hip alignment and increased height of clamshell. Noted improvement in activation of left ER as intervention progressed.   Tall kneeling weight shifting right and left with BUE on bench and then rolling physioball. Verbal cues and manual facilitation of hips for weight shifting.  Pt demonstrates decreased weight shifting towards the right and fighting against the motion with manual facilitation which improved with tactile cuing on right ASIS only. Increased repetitions of weight shifting towards right secondary to  tendency to fighting against weight shifting towards the right.   Rhythmic stabs at pelvis in tall kneeing 10x each direction. Max mutlimodal cuing for instructions throughout secondary to decreased motor planning with activity. Pt demonstrates increased difficulty with ability to perform rhythmic stabs with resistance to left ASIS and right PSIS by going with the motion instead of against.   Step ups forward and sideways on 3 inch step 2x10 BLE each direction. Pt unable to perform on 6 inch step with correct body mechanics. Throughout step ups tactile and manual cuing at pelvis to facilitate necessary lateral weight shifting and rotation while preventing excessive weight shift to LLE/left lateral lean. Verbal cuing for activation of glutes and upright posture.   Gait training 70ft no AD with Min A for weight shifting at the hips and verbal cues for posture and increased LLE foot clearance.  >21ft with RW from gym to room post session with minA for weight shifting and pevlic rotation.  Noted genu recurvatum occasionally with LLE which could be corrected with blocking excessive posterior rotation of left hip.   Pt left sitting in recliner with chair alarm on at the end of session. Call bell and tray within reach. No other needs expressed at this time.   Therapy Documentation Precautions:  Precautions Precautions: Fall Precaution Comments: RUE and RLE ataxia Restrictions Weight Bearing Restrictions: No  Pain: Pain Assessment Pain Scale: 0-10 Pain Score: 0-No pain Faces Pain Scale: No hurt   Therapy/Group: Individual Therapy  Marquies Wanat, SPT 11/05/2020, 10:49 AM

## 2020-11-06 LAB — CBC
HCT: 36 % (ref 36.0–46.0)
Hemoglobin: 12.3 g/dL (ref 12.0–15.0)
MCH: 33 pg (ref 26.0–34.0)
MCHC: 34.2 g/dL (ref 30.0–36.0)
MCV: 96.5 fL (ref 80.0–100.0)
Platelets: 279 10*3/uL (ref 150–400)
RBC: 3.73 MIL/uL — ABNORMAL LOW (ref 3.87–5.11)
RDW: 11.9 % (ref 11.5–15.5)
WBC: 5.2 10*3/uL (ref 4.0–10.5)
nRBC: 0 % (ref 0.0–0.2)

## 2020-11-06 LAB — BASIC METABOLIC PANEL
Anion gap: 10 (ref 5–15)
BUN: 20 mg/dL (ref 8–23)
CO2: 24 mmol/L (ref 22–32)
Calcium: 9.3 mg/dL (ref 8.9–10.3)
Chloride: 104 mmol/L (ref 98–111)
Creatinine, Ser: 0.88 mg/dL (ref 0.44–1.00)
GFR, Estimated: >60 mL/min (ref >60)
Glucose, Bld: 97 mg/dL (ref 70–99)
Potassium: 4.1 mmol/L (ref 3.5–5.1)
Sodium: 138 mmol/L (ref 135–145)

## 2020-11-06 NOTE — Progress Notes (Signed)
Occupational Therapy Session Note  Patient Details  Name: Pamela Mason MRN: [id number] Date of Birth: Apr 20, 1946  Today's Date: 11/06/2020 OT Individual Time: 0907-1000 OT Individual Time Calculation (min): 53 min    Short Term Goals: Week 2:  OT Short Term Goal 1 (Week 2): Pt will complete all aspects of UB dressing with SUP. OT Short Term Goal 2 (Week 2): Pt will complete transfers to all appropriate surfaces with SUP and LRAD. OT Short Term Goal 3 (Week 2): Pt will bathe with setup while seated in shower. OT Short Term Goal 4 (Week 2): Pt will complete all aspects of toileting with SUP.  Skilled Therapeutic Interventions/Progress Updates:     Pt received semi-reclined in bed, denies pain, agreeable to therapy. Session focus on self-care retraining, functional cognition and mobility, and activity tolerance in prep for improved ADL/IADL performance. Came to sitting EOB distant S. Completed UBD and LBD with close S for STS, pt opting to doff pants via lateral leans. Additionally, donned B socks and shoes with set-up A and min Vcs for locating items. STS and amb at room level with RW with close S for safe RW use, stood to brush hair at sink. Amb to and from gym with RW and close S. In kitchen, pt made jello + washed dishes with overall close S for balance and mod Vcs for recalling necessary items and task sequencing. Pt noted to frequently look back at directions on box and reports post activity that she " had to think real hard." Pt req min Vcs to initiate boiling water on stove top vs solely receiving hot water from sink. Reports no fatigue post activity. Finally, completed 3 min then 2 min on Nustep at resistive level 7/10, reporting 5/10 on modified RPE post activity. Back in room, completed ambulatory toilet transfer with RW and close S, close S for LB clothing management and seated anterior pericare post continent void of urine. Mod Vcs to recall activities of session to write in memory  notebook.  Pt left seated in recliner with chair alarm engaged, call bell in reach, and all immediate needs met.   Therapy Documentation Precautions:  Precautions Precautions: Fall Precaution Comments: mild RUE and RLE ataxia Restrictions Weight Bearing Restrictions: No  Pain: c/o mild L knee pain during nustep activity , resolves momentarily    ADL: See Care Tool for more details. Therapy/Group: Individual Therapy  Volanda Napoleon MS, OTR/L  11/06/2020, 6:51 AM

## 2020-11-06 NOTE — Progress Notes (Signed)
Physical Therapy Session Note  Patient Details  Name: Pamela Mason MRN: [id number] Date of Birth: May 14, 1945  Today's Date: 11/06/2020 PT Individual Time: 1305-1415 PT Individual Time Calculation (min): 70 min   Short Term Goals: Week 2:  PT Short Term Goal 1 (Week 2): = to LTGs based on ELOS  Skilled Therapeutic Interventions/Progress Updates:    Pt received supine in bed tearful stating "I don't know why, but I'm just so emotional." Therapist provided emotional support and pt with improved mood and no additional moments of sadness/tearfulness during session. Supine>sitting R EOB, HOB slightly elevated but not using bedrails, supervision. Sitting EOB donned tennis shoes with min assist for time management. L stand pivot EOB>w/c, no AD but using armrests as needed, with CGA for safety. Transported pt outside in w/c - pt reports that prior to hospitalization she enjoyed spending majority of her days outside in the yard.   Gait training outside both using RW and not using RW; including on unlevel surfaces (brick pathway, up/down ramp), on/off curb step, and navigating outdoor steps with only 1 HR - requires cuing for slow, safe AD management as pt moving it quickly and not watching walker pathway to avoid hitting objects on ground such as cracks and mulch; when ambulating without AD requires cuing for increased L LE foot clearance and slower, more controlled ambulation due to impaired L LE coordination and placement requiring more impaired balance - requires CGA with intermittent min assist when using RW as opposed to requiring constant min assist with occasional mod assist without AD when on more unlevel terrain. Requires light min assist navigating stairs using only 1 HR with cuing for reciprocal stepping pattern for L LE NMR.   Transported back up to CIR. Standing<>tall kneeling on mat with B UE support and CGA for safety. In tall kneeling, worked on Dietitian with L UE NMR reaching task  to grasp and place clothespins on basketball goal all without R UE support - requires CGA for safety throughout. Transported back up towards room. Gait training ~7ft, no AD, with CGA/min assist for steadying - continued cuing for increased L LE foot clearance though improving. Pt left seated in recliner with needs in reach and chair alarm on.  Therapy Documentation Precautions:  Precautions Precautions: Fall Precaution Comments: mild RUE and RLE ataxia Restrictions Weight Bearing Restrictions: No  Pain: No reports of pain throughout session.  Therapy/Group: Individual Therapy  Tawana Scale , PT, DPT, NCS, CSRS  11/06/2020, 12:25 PM

## 2020-11-06 NOTE — Plan of Care (Signed)
  Problem: Consults Goal: RH GENERAL PATIENT EDUCATION Description: See Patient Education module for education specifics. Outcome: Progressing Goal: Skin Care Protocol Initiated - if Braden Score 18 or less Description: If consults are not indicated, leave blank or document N/A Outcome: Progressing Goal: Nutrition Consult-if indicated Outcome: Progressing Goal: Diabetes Guidelines if Diabetic/Glucose > 140 Description: If diabetic or lab glucose is > 140 mg/dl - Initiate Diabetes/Hyperglycemia Guidelines & Document Interventions  Outcome: Progressing   Problem: RH BOWEL ELIMINATION Goal: RH STG MANAGE BOWEL WITH ASSISTANCE Description: STG Manage Bowel with mod I Assistance. Outcome: Progressing Goal: RH STG MANAGE BOWEL W/MEDICATION W/ASSISTANCE Description: STG Manage Bowel with Medication with mod I  Assistance. Outcome: Progressing   Problem: RH KNOWLEDGE DEFICIT Goal: RH STG INCREASE KNOWLEDGE OF HYPERTENSION Description: Patient will be able to manage HTN with medications and dietary modifications using handouts and educational tools with cues/reminders Outcome: Progressing

## 2020-11-06 NOTE — Progress Notes (Signed)
  Loop recorder bandage removed.   Large bandage had been placed over steri-strips due to bleeding.   Pad hard with old blood.   All bandaging and steri-strips removed. Wound well healed.   She may shower.  Device transmitting nightly via bedside monitor/phone with no alerts thus far.   Legrand Como [address]" [place], PA-C  11/06/2020 11:50 AM

## 2020-11-06 NOTE — Progress Notes (Signed)
Occupational Therapy Session Note  Patient Details  Name: Pamela Mason MRN: [id number] Date of Birth: March 28, 1946  Today's Date: 11/06/2020 OT Individual Time: 1441-1536 OT Individual Time Calculation (min): 55 min    Short Term Goals: Week 2:  OT Short Term Goal 1 (Week 2): Pt will complete all aspects of UB dressing with SUP. OT Short Term Goal 2 (Week 2): Pt will complete transfers to all appropriate surfaces with SUP and LRAD. OT Short Term Goal 3 (Week 2): Pt will bathe with setup while seated in shower. OT Short Term Goal 4 (Week 2): Pt will complete all aspects of toileting with SUP.  Skilled Therapeutic Interventions/Progress Updates:    Pt in recliner to start with work on reviewing what she completed in therapies earlier in the day.  She needed mod questioning cueing to recall sessions before lunch but could recall her PT session after lunch.  Next, she completed functional mobility down to the therapy gym with mod assist and no device.  Noted decreased heel strike with decreased left stability with stance phase of gait.  Once in the gym her her focus on LUE strengthening and coordination in sitting with use of 1.5 lb wrist weights to help decrease ataxia as well.  She was able to complete 2 sets of 10 reps for shoulder flexion bilaterally while holding a therapy ball.  Mod demonstrational cueing for slowing down and maintaining elbow extension with concentric and eccentric movements.  She then utilized the resistive clothespins for overhead placement on the vertical bar with the wrist weight still in place on the LUE.  Finished activity with completion of [employer].  She was able to complete in 45 and 42 seconds with the LUE and 34 seconds with the right.  Ambulated back to the room with mod assist at end of session.  She was then able to fill out her memory notebook with mod assist to recall activities completed.  Call button and phone in reach.    Therapy  Documentation Precautions:  Precautions Precautions: Fall Precaution Comments: mild LUE and LLE ataxia Restrictions Weight Bearing Restrictions: No  Pain: Pain Assessment Pain Scale: Faces Pain Score: 0-No pain ADL: See Care Tool Section for some details of mobility and selfcare  Therapy/Group: Individual Therapy  Vinita Prentiss OTR/L 11/06/2020, 5:21 PM

## 2020-11-06 NOTE — Progress Notes (Signed)
Speech Language Pathology Daily Session Note  Patient Details  Name: Pamela Mason MRN: [id number] Date of Birth: 07-15-1945  Today's Date: 11/06/2020 SLP Individual Time: 1115-1200 SLP Individual Time Calculation (min): 45 min  Short Term Goals: Week 2: SLP Short Term Goal 1 (Week 2): Pt will demonstrate selective attention to functional tasks within mildly distracting environment with Min A verbal cues. SLP Short Term Goal 2 (Week 2): Pt will demonstrate recall of novel, daily information with min A verbal cues for use of visual aids. SLP Short Term Goal 3 (Week 2): Pt will demonstrate basic-mildly complex problem solving skills with min A verbal cues. SLP Short Term Goal 4 (Week 2): Pt will self-monitor and self-correct functional errors with supervision A verbal cues.  Skilled Therapeutic Interventions: Skilled SLP intervention performed with emphasis on cognitive goals. Facilitated functional problem solving and short-term recall task involving use of visual aids with Mod A verbal cues for recall without visual aids, and Min A verbal cues to refer to visual aids. Required min A verbal cues for problem solving and verbal reasoning. Patient was able to monitor and self correct errors with sup-to-min A verbal cues. Facilitated mildly distracting environment during tasks with low volume background noise, however patient required Mod-to-Max assist to attend and re-direct attention. She progressed to min A verbal cues needed when background noise was eliminated. Patient wrote in memory notebook at the end of session to summarize ST session. Patient was left in bed with alarm activated and needs within reach. Continue per ST POC.    Pain Pain Assessment Pain Scale: 0-10 Pain Score: 0-No pain Patients Stated Pain Goal: 0  Therapy/Group: Individual Therapy  [name] 11/06/2020, 11:27 AM

## 2020-11-07 NOTE — Progress Notes (Signed)
Physical Therapy Session Note  Patient Details  Name: Pamela Mason MRN: [id number] Date of Birth: [birth year]  Today's Date: 11/07/2020 PT Individual Time: 0805-0901 PT Individual Time Calculation (min): 56 min   Short Term Goals: Week 1:  PT Short Term Goal 1 (Week 1): Patient will complete sit <> stand with LRAD and CGA PT Short Term Goal 1 - Progress (Week 1): Met PT Short Term Goal 2 (Week 1): Patient will completed bed<> wc transfer with LRAD and MinA PT Short Term Goal 2 - Progress (Week 1): Met PT Short Term Goal 3 (Week 1): Patient will ambulate >75f with LRAD and MinA PT Short Term Goal 3 - Progress (Week 1): Met Week 2:  PT Short Term Goal 1 (Week 2): = to LTGs based on ELOS  Skilled Therapeutic Interventions/Progress Updates:  Patient supine in bed on entrance to room. Patient alert and agreeable to PT session. Patient denied pain during session, ,but did have one brief moment of dizziness that resolved with change in position.  Therapeutic Activity: Bed Mobility: Patient performed supine <> sit with Mod I. No vc required to complete and no LOB noted. Good sitting balance on EOB to don clothes, socks, and shoes with supervision. Tied shoes with extra time and supervision. Transfers: Patient performed STS and SPVT transfers throughout session with supervision. Provided vc for positioning in front of chair.  Gait Training:  Patient ambulated >300 feet using RW with CGA/ supervision. Pt noted to advance RLE with whole body advancement on R side. PT provided resistance to R ASIS along with vc in order to promote increased hip flexion for RLE advancement. Improvement noted in last third of amb bouts. Pt also guided in amb 125' x1 with no AD and required vc for increased L step height/ length and upright posture. Improved arm swing with distance but pt tending to hold LUE out in scapular plane. Two instances of minor LOB to L side with pt able to self correct with good stepping  strategy and CGA from therapist. Good balance in 180deg turn noted.   Neuromuscular Re-ed: NMR facilitated during session with focus on motor control and neuromuscular facilitation. Pt guided in: - standing "Y"s using theraband for shoulder and core strengthening/ facilitation,  - hooklying hip abd with theraband and focus on 7 sec eccentric return,  - hooklying briding with theraband above knees with focus on 7sec eccentric lowering to table,  - quadriped LE extensions and progressed to birddogs. Pt is able to maintain balance with CGA with LLE/ RUE lift, but requires up to Mod A for RLE/ LUE lift so focus spent on RLE extension only. Pt relates feeling of dizziness and assisted to hooklying position which resolves symptoms.   NMR performed for improvements in motor control and coordination, balance, sequencing, judgement, and self confidence/ efficacy in performing all aspects of mobility at highest level of independence.   Patient seated upright in recliner at end of session with brakes locked, seat alarm set, and all needs within reach.     Therapy Documentation Precautions:  Precautions Precautions: Fall Precaution Comments: mild LUE and LLE ataxia Restrictions Weight Bearing Restrictions: No  Therapy/Group: Individual Therapy  JAlger SimonsPT, DPT 11/07/2020, 4:09 PM

## 2020-11-07 NOTE — Progress Notes (Signed)
Occupational Therapy Session Note  Patient Details  Name: Pamela Mason MRN: [id number] Date of Birth: 1945/11/28  Today's Date: 11/07/2020 OT Individual Time: 3903-0092 OT Individual Time Calculation (min): 32 min    Short Term Goals: Week 2:  OT Short Term Goal 1 (Week 2): Pt will complete all aspects of UB dressing with SUP. OT Short Term Goal 2 (Week 2): Pt will complete transfers to all appropriate surfaces with SUP and LRAD. OT Short Term Goal 3 (Week 2): Pt will bathe with setup while seated in shower. OT Short Term Goal 4 (Week 2): Pt will complete all aspects of toileting with SUP.  Skilled Therapeutic Interventions/Progress Updates:    Pt worked on LUE coordination in her room with use of the small therapy ball toss and catch from seated position with 95% accuracy, using BUEs.  She then worked on sorting and shuffling playing cards with the LUE as well as picking them up off of the top of the pile one at a time and separating them into suits.  She also completed weightbearing activity in standing with min assist for balance, using the LUE to wash the doors and wall in the room with shoulder flexion from 50-120 degrees.  Finished session with min assist transfer to the bed to rest with the call button and phone in reach.  Safety alarm in place as well.   Therapy Documentation Precautions:  Precautions Precautions: Fall Precaution Comments: mild LUE and LLE ataxia Restrictions Weight Bearing Restrictions: No  Pain: Pain Assessment Pain Scale: Faces Pain Score: 0-No pain ADL: See Care Tool Section for some details of mobility and selfcare  Therapy/Group: Individual Therapy  Pink Maye OTR/L 11/07/2020, 3:27 PM

## 2020-11-07 NOTE — Plan of Care (Signed)
  Problem: Consults Goal: RH GENERAL PATIENT EDUCATION Description: See Patient Education module for education specifics. Outcome: Progressing Goal: Skin Care Protocol Initiated - if Braden Score 18 or less Description: If consults are not indicated, leave blank or document N/A Outcome: Progressing Goal: Nutrition Consult-if indicated Outcome: Progressing Goal: Diabetes Guidelines if Diabetic/Glucose > 140 Description: If diabetic or lab glucose is > 140 mg/dl - Initiate Diabetes/Hyperglycemia Guidelines & Document Interventions  Outcome: Progressing   Problem: RH BOWEL ELIMINATION Goal: RH STG MANAGE BOWEL WITH ASSISTANCE Description: STG Manage Bowel with mod I Assistance. Outcome: Progressing Goal: RH STG MANAGE BOWEL W/MEDICATION W/ASSISTANCE Description: STG Manage Bowel with Medication with mod I  Assistance. Outcome: Progressing   Problem: RH BLADDER ELIMINATION Goal: RH STG MANAGE BLADDER WITH ASSISTANCE Description: STG Manage Bladder With no Assistance Outcome: Progressing   Problem: RH SAFETY Goal: RH STG ADHERE TO SAFETY PRECAUTIONS W/ASSISTANCE/DEVICE Description: STG Adhere to Safety Precautions With cues/reminders Assistance/Device. Outcome: Progressing   Problem: RH KNOWLEDGE DEFICIT Goal: RH STG INCREASE KNOWLEDGE OF HYPERTENSION Description: Patient will be able to manage HTN with medications and dietary modifications using handouts and educational tools with cues/reminders Outcome: Progressing Goal: RH STG INCREASE KNOWLEGDE OF HYPERLIPIDEMIA Description: Patient will be able to manage HLD with medications and dietary modifications using handouts and educational tools with cues/reminders Outcome: Progressing Goal: RH STG INCREASE KNOWLEDGE OF STROKE PROPHYLAXIS Description: Patient will be able to manage secondary stroke risks with medications and dietary modifications using handouts and educational tools with cues/reminders Outcome: Progressing   Problem:  Consults Goal: RH STROKE PATIENT EDUCATION Description: See Patient Education module for education specifics  Outcome: Progressing

## 2020-11-07 NOTE — Progress Notes (Signed)
Occupational Therapy Session Note  Patient Details  Name: Pamela Mason MRN: [id number] Date of Birth: 22-Mar-1946  Today's Date: 11/07/2020 OT Individual Time: 6950-7225 OT Individual Time Calculation (min): 32 min    Short Term Goals: Week 1:  OT Short Term Goal 1 (Week 1): Pt will complete UB dressing with supervision for all aspects. OT Short Term Goal 1 - Progress (Week 1): Progressing toward goal OT Short Term Goal 2 (Week 1): Pt will complete LB bathing at min assist level sit to stand for at least two consecuitve sessions. OT Short Term Goal 2 - Progress (Week 1): Met OT Short Term Goal 3 (Week 1): Pt will complete LB dressing with min assist sit to stand. OT Short Term Goal 3 - Progress (Week 1): Met OT Short Term Goal 4 (Week 1): Pt will complete toilet transfers with use of the RW and 3:1 with min assist. OT Short Term Goal 4 - Progress (Week 1): Met  Skilled Therapeutic Interventions/Progress Updates:    Pt participated in rhythmic drumming group. Pain not reported during session Focus of group on BUE coordination, strengthening, endurance, timing/control, activity tolerance, and social participation and engagement. Pt performs session from seated position for energy conservation. Skilled interventions included improving BUE strengthening through full ROM and access timing as NMR. Warm up performed prior to exercises and UB stretching completed at end of group with demo from OT. Pt unable to select preferred song to share with group, but states, "I like all types of music" and sings along with some songs. Returned pt to room at end of session. Exited session with pt seated in bed, exit alarm on and call light in reach   Therapy Documentation Precautions:  Precautions Precautions: Fall Precaution Comments: mild LUE and LLE ataxia Restrictions Weight Bearing Restrictions: No General:   Vital Signs: Therapy Vitals Temp: 97.7 F (36.5 C) Temp Source: Oral Pulse Rate:  68 Resp: 14 BP: 112/72 Patient Position (if appropriate): Lying Oxygen Therapy SpO2: 95 % O2 Device: Room Air Pain:   ADL: ADL Eating: Set up Where Assessed-Eating: Chair Grooming: Supervision/safety Where Assessed-Grooming: Edge of bed Upper Body Bathing: Supervision/safety Where Assessed-Upper Body Bathing: Edge of bed Lower Body Bathing: Moderate assistance Where Assessed-Lower Body Bathing: Edge of bed Upper Body Dressing: Minimal assistance Where Assessed-Upper Body Dressing: Edge of bed Lower Body Dressing: Moderate assistance Where Assessed-Lower Body Dressing: Edge of bed Toileting: Moderate assistance Where Assessed-Toileting: Bedside Commode Toilet Transfer: Moderate assistance Toilet Transfer Method: Stand pivot Toilet Transfer Equipment: Radiographer, therapeutic: Not assessed Social research officer, government: Not assessed Vision   Perception    Praxis   Exercises:   Other Treatments:     Therapy/Group: Group Therapy  Tonny Branch 11/07/2020, 6:59 AM

## 2020-11-07 NOTE — Progress Notes (Signed)
Speech Language Pathology Weekly Progress and Session Note  Patient Details  Name: Pamela Mason MRN: [id number] Date of Birth: Dec 13, 1945  Beginning of progress report period: November 03, 2020 End of progress report period: November 07, 2020  Today's Date: 11/07/2020 SLP Individual Time: 9396-8864 SLP Individual Time Calculation (min): 45 min  Short Term Goals: Week 2: SLP Short Term Goal 1 (Week 2): Pt will demonstrate selective attention to functional tasks within mildly distracting environment with Min A verbal cues. SLP Short Term Goal 1 - Progress (Week 2): Met SLP Short Term Goal 2 (Week 2): Pt will demonstrate recall of novel, daily information with min A verbal cues for use of visual aids. SLP Short Term Goal 2 - Progress (Week 2): Met SLP Short Term Goal 3 (Week 2): Pt will demonstrate basic-mildly complex problem solving skills with min A verbal cues. SLP Short Term Goal 3 - Progress (Week 2): Met SLP Short Term Goal 4 (Week 2): Pt will self-monitor and self-correct functional errors with supervision A verbal cues. SLP Short Term Goal 4 - Progress (Week 2): Not met    New Short Term Goals: Week 3: SLP Short Term Goal 1 (Week 3): STG=LTG due to ELOS  Weekly Progress Updates: Patient continues to demonstrate progress and has met 3 of 4 short-term goals this reporting period. Patient currently requiring min A verbal cues for selective attention within minimally distracting environments, min A verbal cues for recall with use of visual aids, min A verbal cues for basic/functional problem solving, and min A verbal cues for error awareness. Patient continuing to require verbal reminders to refer to memory notebook. Patient and family education is ongoing. Patient would benefit from continued skilled SLP intervention to maximize cognitive function and in order to maximize functional independence prior to discharge.   Intensity: Minumum of 1-2 x/day, 30 to 90 minutes Frequency: 3 to 5 out of  7 days Duration/Length of Stay: 7/7 Treatment/Interventions: Cognitive remediation/compensation;Cueing hierarchy;Functional tasks;Medication managment;Internal/external aids;Patient/family education   Daily Session  Skilled Therapeutic Interventions: Skilled ST intervention performed with focus on cognitive goals. SLP facilitated novel card games (UNO and Scattergories) with sup A verbal cues for selective attention, supervision-to-min A verbal cues for recall of task instructions, and supervision-to-min A for problem solving. Patient able to identify errors with Min A verbal cues following initial cues from therapist. Patient was left in recliner chair with alarm activated and needs within reach. Continue per ST POC.    General    Pain Pain Assessment Pain Scale: 0-10 Pain Score: 0-No pain  Therapy/Group: Individual Therapy  [name] 11/07/2020, 4:06 PM

## 2020-11-08 DIAGNOSIS — I10 Essential (primary) hypertension: Secondary | ICD-10-CM

## 2020-11-08 NOTE — Progress Notes (Signed)
PROGRESS NOTE   Subjective/Complaints: Pt reports that she slept well. Denies pain.   ROS: Patient denies fever, rash, sore throat, blurred vision, nausea, vomiting, diarrhea, cough, shortness of breath or chest pain, joint or back pain, headache, or mood change.   Objective:   No results found. Recent Labs    11/06/20 0428  WBC 5.2  HGB 12.3  HCT 36.0  PLT 279    Recent Labs    11/06/20 0428  NA 138  K 4.1  CL 104  CO2 24  GLUCOSE 97  BUN 20  CREATININE 0.88  CALCIUM 9.3     Intake/Output Summary (Last 24 hours) at 11/08/2020 1637 Last data filed at 11/08/2020 1300 Gross per 24 hour  Intake 600 ml  Output --  Net 600 ml        Physical Exam: Vital Signs Blood pressure 105/69, pulse 75, temperature 98 F (36.7 C), resp. rate 18, height 5\' 4"  (1.626 m), weight 55.8 kg, SpO2 96 %.  Constitutional: No distress . Vital signs reviewed. HEENT: EOMI, oral membranes moist Neck: supple Cardiovascular: RRR without murmur. No JVD    Respiratory/Chest: CTA Bilaterally without wheezes or rales. Normal effort    GI/Abdomen: BS +, non-tender, non-distended Ext: no clubbing, cyanosis, or edema Psych: pleasant and cooperative  Neurological:    Mental Status: She is alert and oriented to person, place, and time.    Motor: Weakness present.    Decreased coordination LUE and LLE with FTN, HTS    Comments: Motor 4/5 left delt bi, trik grip HF, KE, 4-/5 ADF   Assessment/Plan: 1. Functional deficits which require 3+ hours per day of interdisciplinary therapy in a comprehensive inpatient rehab setting. Physiatrist is providing close team supervision and 24 hour management of active medical problems listed below. Physiatrist and rehab team continue to assess barriers to discharge/monitor patient progress toward functional and medical goals  Care Tool:  Bathing    Body parts bathed by patient: Right arm, Left arm,  Chest, Abdomen, Right upper leg, Left upper leg, Right lower leg, Left lower leg, Face   Body parts bathed by helper: Front perineal area, Buttocks     Bathing assist Assist Level: Minimal Assistance - Patient > 75%     Upper Body Dressing/Undressing Upper body dressing   What is the patient wearing?: Pull over shirt    Upper body assist Assist Level: Supervision/Verbal cueing    Lower Body Dressing/Undressing Lower body dressing      What is the patient wearing?: Pants     Lower body assist Assist for lower body dressing: Supervision/Verbal cueing     Toileting Toileting    Toileting assist Assist for toileting: Supervision/Verbal cueing     Transfers Chair/bed transfer  Transfers assist     Chair/bed transfer assist level: Contact Guard/Touching assist     Locomotion Ambulation   Ambulation assist      Assist level: Minimal Assistance - Patient > 75% Assistive device: No Device Max distance: 247ft   Walk 10 feet activity   Assist     Assist level: Supervision/Verbal cueing Assistive device: Walker-rolling   Walk 50 feet activity   Assist Walk 50  feet with 2 turns activity did not occur: Safety/medical concerns  Assist level: Minimal Assistance - Patient > 75% Assistive device: No Device    Walk 150 feet activity   Assist Walk 150 feet activity did not occur: Safety/medical concerns  Assist level: Minimal Assistance - Patient > 75% Assistive device: No Device    Walk 10 feet on uneven surface  activity   Assist Walk 10 feet on uneven surfaces activity did not occur: Safety/medical concerns         Wheelchair     Assist Will patient use wheelchair at discharge?: No             Wheelchair 50 feet with 2 turns activity    Assist            Wheelchair 150 feet activity     Assist          Blood pressure 105/69, pulse 75, temperature 98 F (36.7 C), resp. rate 18, height 5\' 4"  (1.626 m), weight  55.8 kg, SpO2 96 %.  Medical Problem List and Plan: 1.  Decrease self care and mobility  secondary to Right subcortical infarct             -patient may  shower             -ELOS/Goals: 7/7 24/7 sup goals F/u neuro 4 wks post rehab dc  -Continue CIR therapies including PT, OT, and SLP  2.  Impaired mobility, ambulating 450 feet, d/c Lovenox             -antiplatelet therapy: ASA 81mg , and clopidigrel75mg  x 3 wk then plavix alone 3. Pain Management: Tylenol prn 4. Depression: Continue Sertraline 100mg  daily             -antipsychotic agents: none 5. Neuropsych: This patient is capable of making decisions on her own behalf would need family assistance for more complex financial and medical decisions. 6. Skin/Wound Care: monitor 7. Fluids/Electrolytes/Nutrition: low sodium HDPD 8.  HTN: continue lisinopril 10mg   7/2 Vitals:   11/08/20 0509 11/08/20 1258  BP: (!) 91/59 105/69  Pulse: 66 75  Resp: 16 18  Temp: 98.3 F (36.8 C) 98 F (36.7 C)  SpO2: 94% 96%    9.  Mild dementia: continue Galantamine 8mg  per day 10.  HLD simvastatin 40mg  daily 11.  Vit D deficiency Ergocalciferol 50,000U once per week for 7 weeks.  12.  B12 supplemkent 567mcg daily 13. Constipation colace 1 po qd  -moved bowels 7/1        LOS: 13 days A FACE TO FACE EVALUATION WAS PERFORMED  Meredith Staggers 11/08/2020, 4:37 PM

## 2020-11-08 NOTE — Plan of Care (Signed)
  Problem: Consults Goal: RH GENERAL PATIENT EDUCATION Description: See Patient Education module for education specifics. Outcome: Progressing Goal: Skin Care Protocol Initiated - if Braden Score 18 or less Description: If consults are not indicated, leave blank or document N/A Outcome: Progressing Goal: Nutrition Consult-if indicated Outcome: Progressing Goal: Diabetes Guidelines if Diabetic/Glucose > 140 Description: If diabetic or lab glucose is > 140 mg/dl - Initiate Diabetes/Hyperglycemia Guidelines & Document Interventions  Outcome: Progressing   Problem: RH BOWEL ELIMINATION Goal: RH STG MANAGE BOWEL WITH ASSISTANCE Description: STG Manage Bowel with mod I Assistance. Outcome: Progressing Goal: RH STG MANAGE BOWEL W/MEDICATION W/ASSISTANCE Description: STG Manage Bowel with Medication with mod I  Assistance. Outcome: Progressing   Problem: RH BLADDER ELIMINATION Goal: RH STG MANAGE BLADDER WITH ASSISTANCE Description: STG Manage Bladder With no Assistance Outcome: Progressing   Problem: RH SAFETY Goal: RH STG ADHERE TO SAFETY PRECAUTIONS W/ASSISTANCE/DEVICE Description: STG Adhere to Safety Precautions With cues/reminders Assistance/Device. Outcome: Progressing   Problem: RH KNOWLEDGE DEFICIT Goal: RH STG INCREASE KNOWLEDGE OF HYPERTENSION Description: Patient will be able to manage HTN with medications and dietary modifications using handouts and educational tools with cues/reminders Outcome: Progressing Goal: RH STG INCREASE KNOWLEGDE OF HYPERLIPIDEMIA Description: Patient will be able to manage HLD with medications and dietary modifications using handouts and educational tools with cues/reminders Outcome: Progressing Goal: RH STG INCREASE KNOWLEDGE OF STROKE PROPHYLAXIS Description: Patient will be able to manage secondary stroke risks with medications and dietary modifications using handouts and educational tools with cues/reminders Outcome: Progressing   Problem:  Consults Goal: RH STROKE PATIENT EDUCATION Description: See Patient Education module for education specifics  Outcome: Progressing

## 2020-11-09 NOTE — Plan of Care (Signed)
  Problem: Consults Goal: RH GENERAL PATIENT EDUCATION Description: See Patient Education module for education specifics. Outcome: Progressing Goal: Skin Care Protocol Initiated - if Braden Score 18 or less Description: If consults are not indicated, leave blank or document N/A Outcome: Progressing Goal: Nutrition Consult-if indicated Outcome: Progressing Goal: Diabetes Guidelines if Diabetic/Glucose > 140 Description: If diabetic or lab glucose is > 140 mg/dl - Initiate Diabetes/Hyperglycemia Guidelines & Document Interventions  Outcome: Progressing   Problem: RH BOWEL ELIMINATION Goal: RH STG MANAGE BOWEL WITH ASSISTANCE Description: STG Manage Bowel with mod I Assistance. Outcome: Progressing Goal: RH STG MANAGE BOWEL W/MEDICATION W/ASSISTANCE Description: STG Manage Bowel with Medication with mod I  Assistance. Outcome: Progressing   Problem: RH BLADDER ELIMINATION Goal: RH STG MANAGE BLADDER WITH ASSISTANCE Description: STG Manage Bladder With no Assistance Outcome: Progressing   Problem: RH SAFETY Goal: RH STG ADHERE TO SAFETY PRECAUTIONS W/ASSISTANCE/DEVICE Description: STG Adhere to Safety Precautions With cues/reminders Assistance/Device. Outcome: Progressing   Problem: RH KNOWLEDGE DEFICIT Goal: RH STG INCREASE KNOWLEDGE OF HYPERTENSION Description: Patient will be able to manage HTN with medications and dietary modifications using handouts and educational tools with cues/reminders Outcome: Progressing Goal: RH STG INCREASE KNOWLEGDE OF HYPERLIPIDEMIA Description: Patient will be able to manage HLD with medications and dietary modifications using handouts and educational tools with cues/reminders Outcome: Progressing Goal: RH STG INCREASE KNOWLEDGE OF STROKE PROPHYLAXIS Description: Patient will be able to manage secondary stroke risks with medications and dietary modifications using handouts and educational tools with cues/reminders Outcome: Progressing   Problem:  Consults Goal: RH STROKE PATIENT EDUCATION Description: See Patient Education module for education specifics  Outcome: Progressing

## 2020-11-09 NOTE — Progress Notes (Signed)
Occupational Therapy Session Note  Patient Details  Name: Pamela Mason MRN: [id number] Date of Birth: Apr 18, 1946  Today's Date: 11/09/2020 OT Group Time: 1100-1200 OT Group Time Calculation (min): 60 min   Skilled Therapeutic Interventions/Progress Updates:    Pt engaged in therapeutic w/c level dance group focusing on patient choice, UE/LE strengthening, salience, activity tolerance, and social participation. Pt was guided through various dance-based exercises involving UEs/LEs and trunk. All music was selected by group members. Emphasis placed on Lt sided coordination and standing balance. Pt with very high levels of participation throughout group in seated and standing positions. Min balance assistance while dancing to 1 song in standing, 1 small LOB while dynamically moving her feet. Did well with incorporating her Lt side during bilateral exercises that were taught. At end of tx OT returned pt to her room.   Therapy Documentation Precautions:  Precautions Precautions: Fall Precaution Comments: mild LUE and LLE ataxia Restrictions Weight Bearing Restrictions: No  Pain: Pain Assessment Pain Scale: 0-10 Pain Score: 0-No pain ADL: ADL Eating: Set up Where Assessed-Eating: Chair Grooming: Supervision/safety Where Assessed-Grooming: Edge of bed Upper Body Bathing: Supervision/safety Where Assessed-Upper Body Bathing: Edge of bed Lower Body Bathing: Moderate assistance Where Assessed-Lower Body Bathing: Edge of bed Upper Body Dressing: Minimal assistance Where Assessed-Upper Body Dressing: Edge of bed Lower Body Dressing: Moderate assistance Where Assessed-Lower Body Dressing: Edge of bed Toileting: Moderate assistance Where Assessed-Toileting: Bedside Commode Toilet Transfer: Moderate assistance Toilet Transfer Method: Stand pivot Toilet Transfer Equipment: Engineer, technical sales Transfer: Not assessed Social research officer, government: Not assessed     Therapy/Group:  Group Therapy  Marisela Line A Eithen Castiglia 11/09/2020, 12:38 PM

## 2020-11-10 NOTE — Progress Notes (Signed)
PROGRESS NOTE   Subjective/Complaints: No issues overnite, pt happy about going out for PT  ROS: Patient denies fCP, SOB, N/V/D    Objective:   No results found. No results for input(s): WBC, HGB, HCT, PLT in the last 72 hours.   No results for input(s): NA, K, CL, CO2, GLUCOSE, BUN, CREATININE, CALCIUM in the last 72 hours.    Intake/Output Summary (Last 24 hours) at 11/10/2020 0936 Last data filed at 11/10/2020 0700 Gross per 24 hour  Intake 814 ml  Output --  Net 814 ml         Physical Exam: Vital Signs Blood pressure (!) 96/58, pulse 64, temperature 97.7 F (36.5 C), temperature source Oral, resp. rate 20, height 5\' 4"  (1.626 m), weight 55.8 kg, SpO2 92 %.  Constitutional: No distress . Vital signs reviewed. HEENT: EOMI, oral membranes moist Neck: supple Cardiovascular: RRR without murmur. No JVD    Respiratory/Chest: CTA Bilaterally without wheezes or rales. Normal effort    GI/Abdomen: BS +, non-tender, non-distended Ext: no clubbing, cyanosis, or edema Psych: pleasant and cooperative  Neurological:    Mental Status: She is alert and oriented to person, place, and time.    Motor: Weakness present.    Decreased coordination LUE and LLE with FTN, HTS    Comments: Motor 5/5 Bilateral but has reduced motor control on L    Assessment/Plan: 1. Functional deficits which require 3+ hours per day of interdisciplinary therapy in a comprehensive inpatient rehab setting. Physiatrist is providing close team supervision and 24 hour management of active medical problems listed below. Physiatrist and rehab team continue to assess barriers to discharge/monitor patient progress toward functional and medical goals  Care Tool:  Bathing    Body parts bathed by patient: Right arm, Left arm, Chest, Abdomen, Right upper leg, Left upper leg, Right lower leg, Left lower leg, Face   Body parts bathed by helper: Front perineal  area, Buttocks     Bathing assist Assist Level: Minimal Assistance - Patient > 75%     Upper Body Dressing/Undressing Upper body dressing   What is the patient wearing?: Pull over shirt    Upper body assist Assist Level: Supervision/Verbal cueing    Lower Body Dressing/Undressing Lower body dressing      What is the patient wearing?: Pants     Lower body assist Assist for lower body dressing: Supervision/Verbal cueing     Toileting Toileting    Toileting assist Assist for toileting: Supervision/Verbal cueing     Transfers Chair/bed transfer  Transfers assist     Chair/bed transfer assist level: Contact Guard/Touching assist     Locomotion Ambulation   Ambulation assist      Assist level: Minimal Assistance - Patient > 75% Assistive device: No Device Max distance: 247ft   Walk 10 feet activity   Assist     Assist level: Supervision/Verbal cueing Assistive device: Walker-rolling   Walk 50 feet activity   Assist Walk 50 feet with 2 turns activity did not occur: Safety/medical concerns  Assist level: Minimal Assistance - Patient > 75% Assistive device: No Device    Walk 150 feet activity   Assist Walk 150 feet  activity did not occur: Safety/medical concerns  Assist level: Minimal Assistance - Patient > 75% Assistive device: No Device    Walk 10 feet on uneven surface  activity   Assist Walk 10 feet on uneven surfaces activity did not occur: Safety/medical concerns         Wheelchair     Assist Will patient use wheelchair at discharge?: No             Wheelchair 50 feet with 2 turns activity    Assist            Wheelchair 150 feet activity     Assist          Blood pressure (!) 96/58, pulse 64, temperature 97.7 F (36.5 C), temperature source Oral, resp. rate 20, height 5\' 4"  (1.626 m), weight 55.8 kg, SpO2 92 %.  Medical Problem List and Plan: 1.  Decrease self care and mobility  secondary to  Right subcortical infarct             -patient may  shower             -ELOS/Goals: 7/7 24/7 sup goals F/u neuro 4 wks post rehab dc  -Continue CIR therapies including PT, OT, and SLP  2.  Impaired mobility, ambulating 450 feet, d/c Lovenox             -antiplatelet therapy: ASA 81mg , and clopidigrel75mg  x 3 wk then plavix alone 3. Pain Management: Tylenol prn 4. Depression: Continue Sertraline 100mg  daily             -antipsychotic agents: none 5. Neuropsych: This patient is capable of making decisions on her own behalf would need family assistance for more complex financial and medical decisions. 6. Skin/Wound Care: monitor 7. Fluids/Electrolytes/Nutrition: low sodium HDPD 8.  HTN: continue lisinopril 10mg   7/2 Vitals:   11/09/20 1933 11/10/20 0501  BP: 115/80 (!) 96/58  Pulse: 74 64  Resp: 20 20  Temp: 98.2 F (36.8 C) 97.7 F (36.5 C)  SpO2: 98% 92%   A little soft this am will monitor if remains below 100Sys would reduce lisinopril  9.  Mild dementia: continue Galantamine 8mg  per day 10.  HLD simvastatin 40mg  daily 11.  Vit D deficiency Ergocalciferol 50,000U once per week for 7 weeks.  12.  B12 supplemkent 572mcg daily 13. Constipation colace 1 po qd  -moved bowels 7/1        LOS: 15 days A FACE TO [place] E Annalyce Lanpher 11/10/2020, 9:36 AM

## 2020-11-10 NOTE — Progress Notes (Signed)
Physical Therapy Session Note  Patient Details  Name: Pamela Mason MRN: [id number] Date of Birth: January 02, 1946  Today's Date: 11/10/2020 PT Individual Time: 0804-0900 PT Individual Time Calculation (min): 56 min   Short Term Goals: Week 1:  PT Short Term Goal 1 (Week 1): Patient will complete sit <> stand with LRAD and CGA PT Short Term Goal 1 - Progress (Week 1): Met PT Short Term Goal 2 (Week 1): Patient will completed bed<> wc transfer with LRAD and MinA PT Short Term Goal 2 - Progress (Week 1): Met PT Short Term Goal 3 (Week 1): Patient will ambulate >47f with LRAD and MinA PT Short Term Goal 3 - Progress (Week 1): Met Week 2:  PT Short Term Goal 1 (Week 2): = to LTGs based on ELOS  Skilled Therapeutic Interventions/Progress Updates:  Patient semireclined in bed on entrance to room. Patient alert and agreeable to PT session, just needs to don shoes. Patient denied pain during session.  Therapeutic Activity: Bed Mobility: Patient performed supine --> sit with Mod I. Sits EOB to don shoes/ socks placed within reach on floor in front of her. Is able to don all with supervision and good balance.  Transfers: Patient performed STS transfers with supervision to RW throughout session. SPVT transfers peformed with supervision/ CGA using RW with minimal cues for safety. Toilet transfer performed with supervision and pt able to manage clothing and pericare with supervision.   Gait Training:  Patient ambulated >250 ft over indoor level surfaces and then longer community distances over indoor/ outdoor surfaces using RW with supervision/ CGA. Demonstrated decreased step height on L, decreased trunk rotation and L hemibody advancement rather than LLE advancement. Provided vc/ tc for conscious focus to LLE step height and foot clearance throughout gait.  Neuromuscular Re-ed: NMR facilitated during session with focus on L hip muscle activation/ facilitation, standing balance. Pt guided in tall  kneeling with sit to heels and resisted rise to tall kneeling with hands on raised bench. Progressed to rise to tall kneeling and placement of card to it's match overhead and to pt's L side. Pt able to perform for 4 minutes.   Standing high knee march for LLE with PNF rotation of L innominate into posterior rotation. At start of exercise, no rotation noted until final 20% of LE movement. With added PNF into proper rotation, pt is able to demo improved rotation throughout final 60% of LE movement. Performed 2x20, 1x10.  Standing reciprocated forward lunges performed for dynamic standing balance challenge and progressed to hold of 2# weightted ball with BUE. Initially pt requires Min A for RLE forward lunge, CGA for LLE forward lunge. Progressed to CGA overall by end of exercise.   NMR performed for improvements in motor control and coordination, balance, sequencing, judgement, and self confidence/ efficacy in performing all aspects of mobility at highest level of independence.   Patient seated  in recliner at end of session with brakes locked, seat alarm set, and all needs within reach.     Therapy Documentation Precautions:  Precautions Precautions: Fall Precaution Comments: mild LUE and LLE ataxia Restrictions Weight Bearing Restrictions: No  Therapy/Group: Individual Therapy  JAlger SimonsPt, DPT 11/10/2020, 7:50 AM

## 2020-11-10 NOTE — Progress Notes (Signed)
Physical Therapy Session Note  Patient Details  Name: Pamela Mason MRN: [id number] Date of Birth: [birth year]  Today's Date: 11/10/2020 PT Individual Time: 1415-1526 PT Individual Time Calculation (min): 71 min   Short Term Goals: Week 2:  PT Short Term Goal 1 (Week 2): = to LTGs based on ELOS  Skilled Therapeutic Interventions/Progress Updates:    Pt greeted supine in bed to start session - agreeable to PT tx and no reports of pain. Supine<>sit mod I without bed features and able to don socks and tennis shoes (including tying laces) with setupA while seated EOB. Sit<>stand with CGA and no AD and ambulated from her room to elevator with minA and no AD, ~133f. Elevator ride downstairs with standing rest break with CGA and then ambulated to ortho rehab gym with minA and no AD, ~1580f Gait deficits include narrow BOS, occasional L foot slap, decreased L pelvic rotation, and intermittent scissoring. Worked on [employer] BITS system in standing with CGA for balance as she completed user paced targeted reaching with paretic LUE (reaction time 1.63 seconds with 81% accuracy). Also completed Geoboard duplication with 10 sec memory component - able to complete mild complexity puzzles without assist/cues but had difficulty with moderate complexity puzzles despite mod cues. Worked on higher level gait training in hallways with dual-cog tasks (word finding with letter 'o') and serial casting by 2's - pt with significant unsteadiness with completing dual-tasks and requires modA for balance while doing these with worsening of gait deficits, specifically scissoring, L lean, and general unsteadiness - ambulated >50089fhile doing these. She also completed side stepping and tandem walking ([id number] minA and no AD, cues for corrections throughout. Next, worked on dynamic standing on compliant foam pad with minA for balance as she removed resistive clothes pins that were attached to her clothes (including  back of shirt, lower part of pants, back of pants, etc). Also worked on globaRecruitment consultantBLE strengthening with step-up progressions - she completed 1 min of steps ups with bilateral hand rails + 1 min of step ups with unilateral hand rail support + 1 minute of step ups with no UE support. Increased unsteadiness with dynamic stepping with no UE support compared to with hand rail assist. Ended session by completing Nustep for 10 minutes at workload 4, using both BUE and BLE, working on reciprocal movement patterns and LLE strengthening and global endurance training - completed 395 steps total. Ambulated back upstairs to her room with standing rest break in elevator, ~200ft 7f0ft, 79f min/modA and no AD - onset of fatigue impacting her strength and dynamic balance towards end of session. She ended session seated in recliner with chair alarm on and all needs met. Pt appreciative of therapeutic interventions and thankful for all therapies services.   Therapy Documentation Precautions:  Precautions Precautions: Fall Precaution Comments: mild LUE and LLE ataxia Restrictions Weight Bearing Restrictions: No General:    Therapy/Group: Individual Therapy  Aleesia Henney P Buryl Bamber 11/10/2020, 3:13 PM

## 2020-11-10 NOTE — Progress Notes (Signed)
Occupational Therapy Session Note  Patient Details  Name: Pamela Mason MRN: [id number] Date of Birth: [birth year]  Today's Date: 11/10/2020 OT Individual Time: 1000-1058 OT Individual Time Calculation (min): 58 min    Short Term Goals: Week 2:  OT Short Term Goal 1 (Week 2): Pt will complete all aspects of UB dressing with SUP. OT Short Term Goal 2 (Week 2): Pt will complete transfers to all appropriate surfaces with SUP and LRAD. OT Short Term Goal 3 (Week 2): Pt will bathe with setup while seated in shower. OT Short Term Goal 4 (Week 2): Pt will complete all aspects of toileting with SUP.  Skilled Therapeutic Interventions/Progress Updates:    Patient seated in recliner, alert and ready for therapy session.   She notes occ pain in knees.  She is able to donn socks and shoes with set up.  Sit to stand and ambulation with RW CG/CS to/from recliner, mat table, arm chair.  She was able to walk from unit [facility] to therapy gym on unit [address] via elevator.  Completed standing reach and visual motor activities with BITS:  trial 1: saccades A-Z using right hand :55, rxn time 2.13s        trial 2:  rotary saccades #1-25 using left hand  :52, rxn time 2.07s  Completed upper body ergometer in stance for 4 minutes with CGA Completed standing turn and reach activities with focus on weight shift and squat/support on left LE with good results, completed stepping and weight shift activities both right and left.  Bilteral legs stretches.  Completed hand coordination activities with mild difficulty left.  She returned to recliner at close of session, seat alarm set, provided ice pack for knees.  Call bell and tray table in reach.    Therapy Documentation Precautions:  Precautions Precautions: Fall Precaution Comments: mild LUE and LLE ataxia Restrictions Weight Bearing Restrictions: No   Therapy/Group: Individual Therapy  Carlos Levering 11/10/2020, 7:38 AM

## 2020-11-11 MED ORDER — LISINOPRIL 5 MG PO TABS
5.0000 mg | ORAL_TABLET | Freq: Every day | ORAL | Status: DC
  Administered 2020-11-12 – 2020-11-13 (×2): 5 mg via ORAL
  Filled 2020-11-11 (×2): qty 1

## 2020-11-11 NOTE — Progress Notes (Signed)
Physical Therapy Session Note  Patient Details  Name: Pamela Mason MRN: [id number] Date of Birth: May 28, 1945  Today's Date: 11/11/2020 PT Individual Time: 1009-1109 PT Individual Time Calculation (min): 60 min   Short Term Goals: Week 2:  PT Short Term Goal 1 (Week 2): = to LTGs based on ELOS  Skilled Therapeutic Interventions/Progress Updates:    Pt received sitting in recliner and agreeable to therapy session. Educated pt on recommendation to use RW for all standing/ambulating at D/C, pt in agreement with this plan and pt already has RW. Gait training ~229ft x2 to/from gym using RW with CGA progressing to close supervision. Patient participated in [facility] and demonstrates increased fall risk as noted by score of  42/56.  (<36= high risk for falls, close to 100%; 37-45 significant >80%; 46-51 moderate >50%; 52-55 lower >25%). Educated pt on fall risk education and when to call 911, had pt recall and write this education down in her memory notebook at end of session. Demonstrated and instructed pt through floor transfer, performed with close supervision. Discussed D/C plan and spoke with pt's daughter, [name], on the phone to educate on recommendation of 24hr support at D/C. Pt becoming emotional discussing D/C, stating "I don't know why I get so emotional." Therapist and family provided emotional support. At end of session, pt left sitting in recliner with needs in reach and chair alarm on.   Therapy Documentation Precautions:  Precautions Precautions: Fall Precaution Comments: mild LUE and LLE ataxia Restrictions Weight Bearing Restrictions: No   Pain: Denies pain during session.  Balance: Balance Balance Assessed: Yes Standardized Balance Assessment Standardized Balance Assessment: Berg Balance Test Berg Balance Test Sit to Stand: Able to stand  independently using hands Standing Unsupported: Able to stand safely 2 minutes Sitting with Back Unsupported but Feet  Supported on Floor or Stool: Able to sit safely and securely 2 minutes Stand to Sit: Sits safely with minimal use of hands Transfers: Able to transfer safely, minor use of hands Standing Unsupported with Eyes Closed: Able to stand 10 seconds safely Standing Ubsupported with Feet Together: Able to place feet together independently and stand for 1 minute with supervision (minor sway) From Standing, Reach Forward with Outstretched Arm: Can reach confidently >25 cm (10") From Standing Position, Pick up Object from Floor: Able to pick up shoe, needs supervision (minor sway) From Standing Position, Turn to Look Behind Over each Shoulder: Turn sideways only but maintains balance Turn 360 Degrees: Able to turn 360 degrees safely but slowly Standing Unsupported, Alternately Place Feet on Step/Stool: Able to complete >2 steps/needs minimal assist (requires close supervision/CGA) Standing Unsupported, One Foot in Front: Able to take small step independently and hold 30 seconds Standing on One Leg: Able to lift leg independently and hold equal to or more than 3 seconds Total Score: 42     Therapy/Group: Individual Therapy  Tawana Scale , PT, DPT, NCS, CSRS 11/11/2020, 10:32 AM

## 2020-11-11 NOTE — Progress Notes (Signed)
Recreational Therapy Session Note  Patient Details  Name: Pamela Mason MRN: [id number] Date of Birth: 1945-07-18 Today's Date: 11/11/2020  Pain: no c/o Skilled Therapeutic Interventions/Progress Updates: Session focused on discharge planning, activity analysis, identification of leisure adaptations, social, emotional, spiritual health & their impact on cognition and physical health with min questioning cues.  Pt feels prepared for discharge and transitioning to ILF with family support. Therapy/Group: Individual Therapy  Hannan Hutmacher 11/11/2020, 3:29 PM

## 2020-11-11 NOTE — Progress Notes (Signed)
Speech Language Pathology Daily Session Note  Patient Details  Name: Pamela Mason MRN: [id number] Date of Birth: 07-Sep-1945  Today's Date: 11/11/2020 SLP Individual Time: 1400-1445 SLP Individual Time Calculation (min): 45 min  Short Term Goals: Week 3: SLP Short Term Goal 1 (Week 3): STG=LTG due to ELOS  Skilled Therapeutic Interventions: Patient agreeable to ST intervention with focus on cognitive goals and family education. Patient was accompanied by her two daughters on this date. Educated patient and family on ST progress and using external memory aids to promote short-term recall. Patient was able to recall one detail from earlier therapy session without use of memory notebook, and able to successfully describe key events that took place earlier today with use of memory notebook. Discussed recommendations for continuation of memory notebook at discharge as patient transitions to [facility] independent living. Spoke with daughters about requesting for staff to provide reminders for successful carryover, as daughter indicated that patient required reminders at baseline. Daughters reported patient will no longer be managing medication, although patient still had interest in being up-to-date with current medications. Provided patient with medication chart. Patient demonstrated understanding through teach back on uses for chart. Facilitated selective attention task within minimally distracting environment (side conversation from family in room) at Mod I. All questions addressed. Patient was left in chair with alarm activated and needs within reach. Continue per ST POC with discharge from [facility] services tomorrow.     Pain Pain Assessment Pain Scale: 0-10 Pain Score: 0-No pain  Therapy/Group: Individual Therapy  [name] 11/11/2020, 3:38 PM

## 2020-11-11 NOTE — Discharge Summary (Signed)
Physician Discharge Summary  Patient ID: Pamela Mason MRN: [id number] DOB/AGE: 1945-05-16 75 y.o.  Admit date: 10/26/2020 Discharge date: 11/13/2020  Discharge Diagnoses:  Principal Problem:   Subcortical infarction [facility]) Active Problems:   HLD (hyperlipidemia)   HTN (hypertension)   Dementia (HCC)   Discharged Condition: good  Significant Diagnostic Studies:N/A  Labs:  Basic Metabolic Panel: BMP Latest Ref Rng & Units 11/06/2020 10/26/2020 10/23/2020  Glucose 70 - 99 mg/dL 97 - 100(H)  BUN 8 - 23 mg/dL 20 - 18  Creatinine 0.44 - 1.00 mg/dL 0.88 0.82 0.89  Sodium 135 - 145 mmol/L 138 - 139  Potassium 3.5 - 5.1 mmol/L 4.1 - 4.6  Chloride 98 - 111 mmol/L 104 - 107  CO2 22 - 32 mmol/L 24 - 23  Calcium 8.9 - 10.3 mg/dL 9.3 - 8.9      CBC: CBC Latest Ref Rng & Units 11/06/2020 10/26/2020 10/23/2020  WBC 4.0 - 10.5 K/uL 5.2 5.9 6.4  Hemoglobin 12.0 - 15.0 g/dL 12.3 12.6 12.6  Hematocrit 36.0 - 46.0 % 36.0 37.3 39.1  Platelets 150 - 400 K/uL 279 238 224      CBG: No results for input(s): GLUCAP in the last 168 hours.  Brief HPI:   Pamela Mason is a 75 y.o. female with history of mild CAD, HTN, sleep apnea, dyslipidemia who presented to ED on 10/23/20 with left-sided weakness and dizziness. CT head was negative for acute changes.  As she was preparing to go home she had recurrent left-sided weakness with limb ataxia and left neglect.  CTA was negative for LVO.  MRI brain showed small acute infarct right superior insula and corona radiata as well as small infarcts in right occipital lobe.  Patient and daughter reported history of palpitation and irregular heartbeat and loop recorder was placed on 06/17.  Neurology recommended aspirin and Plavix for 3 weeks followed by Plavix alone. Therapy evaluations done revealing deficits in balance affecting mobility as well as cognitive deficits affecting abilities to complete ADLs.  CIR was recommended due to functional  decline.   Hospital Course: AERIS HERSMAN was admitted to rehab 10/26/2020 for inpatient therapies to consist of PT, ST and OT at least three hours five days a week. Past admission physiatrist, therapy team and rehab RN have worked together to provide customized collaborative inpatient rehab. She was maintained on aspirin and Plavix during his stay.  Aspirin to DC after 2 days.  Follow-up labs showed electrolytes is WNL and CBC showed H/H/Platelets are WNL.   Her blood pressures were monitored on TID basis and lisinopril was decreased to 5 mg daily to avoid hypotension.  Her p.o. intake has been good and she is continent of bowel and bladder.  Mood has been stable off of Zoloft.  Loop recorder incision site is clean, dry and intact and is healing well without any signs or symptoms of infection.  She is showing improvement in use of left upper extremity with decrease in ataxia and improvement in coordination.  Supervision is recommended for safety and family has transition patient to assisted living facility.  She will continue to receive follow-up PT, OT and ST at [facility] after discharge.   Rehab course: During patient's stay in rehab weekly team conferences were held to monitor patient's progress, set goals and discuss barriers to discharge. At admission, patient required mod assist with mobility and with ADL tasks. She presented with severe deficits in short-term memory and moderate deficits and problem-solving, awareness and  attention.  Slums score was 12/30. She  has had improvement in activity tolerance, balance, postural control as well as ability to compensate for deficits. She has had improvement in functional use LUE  and LLE as well as improvement in awareness.  She is able to complete ADL tasks with supervision.  She requires supervision for transfers and to ambulate greater than 200 feet with Rollator walker.  She requires supervision to min cues for cognitive tasks and supervision to  min assist for selective attention and mildly destructive environment.  Family education has been completed.  Disposition: [facility] assisted living facility with  Diet: Heart healthy  Special Instructions: 1.  No driving or strenuous activity till cleared by MD.  Allergies as of 11/13/2020       Reactions   Latex    Pt stated she had a reaction to latex in the past. H. Mickley RN   Tape Other (See Comments)   redness        Medication List     STOP taking these medications    sertraline 100 MG tablet Commonly known as: ZOLOFT       TAKE these medications    acetaminophen 325 MG tablet Commonly known as: TYLENOL Take 1-2 tablets (325-650 mg total) by mouth every 4 (four) hours as needed for mild pain.   aspirin 81 MG EC tablet Take 1 tablet (81 mg total) by mouth daily for 2 doses. Then stop.--Need to purchase this over the counter. What changed: additional instructions   clopidogrel 75 MG tablet Commonly known as: PLAVIX Take 1 tablet (75 mg total) by mouth daily.   docusate sodium 100 MG capsule Commonly known as: Colace Take 1 capsule (100 mg total) by mouth 2 (two) times daily. Purchase over the counter What changed: additional instructions   FLINTSTONES PLUS IRON PO Take 1 tablet by mouth daily with lunch.   galantamine 8 MG 24 hr capsule Commonly known as: RAZADYNE ER Take 8 mg by mouth daily with lunch.   lisinopril 5 MG tablet Commonly known as: ZESTRIL Take 1 tablet (5 mg total) by mouth daily. Start taking on: November 14, 2020 What changed:  medication strength how much to take when to take this   polyethylene glycol 17 g packet Commonly known as: MIRALAX / GLYCOLAX Take 17 g by mouth daily as needed. For constipation--purchase over the counter   simvastatin 40 MG tablet Commonly known as: ZOCOR Take 40 mg by mouth every evening.   vitamin B-12 500 MCG tablet Commonly known as: CYANOCOBALAMIN Take 500 mcg by mouth daily.   Vitamin D  (Ergocalciferol) 1.25 MG (50000 UNIT) Caps capsule Commonly known as: DRISDOL Take 1 capsule (50,000 Units total) by mouth every 7 (seven) days. On sundays What changed: additional instructions        Follow-up Information     Kirsteins, Luanna Salk, MD Follow up.   Specialty: Physical Medicine and Rehabilitation Why: office will call you with follow up appointment Contact information: [place] [address] [phone]         Crist Infante, MD. Call.   Specialty: Internal Medicine Why: for post hospital follow up Contact information: [place] [address] [phone]         [place]. Call in 1 day(s).   Why: for stroke follow up Contact information: [place] [place]     [address] [phone]  Signed: Bary Leriche 11/13/2020, 9:38 AM

## 2020-11-11 NOTE — Progress Notes (Signed)
PROGRESS NOTE   Subjective/Complaints: Daughter will be visiting today , no c/o, pleased with progress  ROS: Patient denies CP, SOB, N/V/D    Objective:   No results found. No results for input(s): WBC, HGB, HCT, PLT in the last 72 hours.   No results for input(s): NA, K, CL, CO2, GLUCOSE, BUN, CREATININE, CALCIUM in the last 72 hours.    Intake/Output Summary (Last 24 hours) at 11/11/2020 0721 Last data filed at 11/10/2020 1735 Gross per 24 hour  Intake 120 ml  Output --  Net 120 ml         Physical Exam: Vital Signs Blood pressure (!) 97/58, pulse (!) 57, temperature 97.7 F (36.5 C), temperature source Oral, resp. rate 20, height 5\' 4"  (1.626 m), weight 55.8 kg, SpO2 96 %.  General: No acute distress Mood and affect are appropriate Heart: Regular rate and rhythm no rubs murmurs or extra sounds Lungs: Clear to auscultation, breathing unlabored, no rales or wheezes Abdomen: Positive bowel sounds, soft nontender to palpation, nondistended Extremities: No clubbing, cyanosis, or edema Skin: No evidence of breakdown, no evidence of rash   Neurological:    Mental Status: She is alert and oriented to person, place, and time.    Motor: Weakness present.    Decreased coordination LUE and LLE with FTN, HTS    Comments: Motor 5/5 Bilateral but has reduced motor control on L    Assessment/Plan: 1. Functional deficits which require 3+ hours per day of interdisciplinary therapy in a comprehensive inpatient rehab setting. Physiatrist is providing close team supervision and 24 hour management of active medical problems listed below. Physiatrist and rehab team continue to assess barriers to discharge/monitor patient progress toward functional and medical goals  Care Tool:  Bathing    Body parts bathed by patient: Right arm, Left arm, Chest, Abdomen, Right upper leg, Left upper leg, Right lower leg, Left lower leg, Face    Body parts bathed by helper: Front perineal area, Buttocks     Bathing assist Assist Level: Minimal Assistance - Patient > 75%     Upper Body Dressing/Undressing Upper body dressing   What is the patient wearing?: Pull over shirt    Upper body assist Assist Level: Supervision/Verbal cueing    Lower Body Dressing/Undressing Lower body dressing      What is the patient wearing?: Pants     Lower body assist Assist for lower body dressing: Supervision/Verbal cueing     Toileting Toileting    Toileting assist Assist for toileting: Supervision/Verbal cueing     Transfers Chair/bed transfer  Transfers assist     Chair/bed transfer assist level: Contact Guard/Touching assist     Locomotion Ambulation   Ambulation assist      Assist level: Minimal Assistance - Patient > 75% Assistive device: No Device Max distance: >55ft   Walk 10 feet activity   Assist     Assist level: Minimal Assistance - Patient > 75% Assistive device: No Device   Walk 50 feet activity   Assist Walk 50 feet with 2 turns activity did not occur: Safety/medical concerns  Assist level: Minimal Assistance - Patient > 75% Assistive device: No Device  Walk 150 feet activity   Assist Walk 150 feet activity did not occur: Safety/medical concerns  Assist level: Minimal Assistance - Patient > 75% Assistive device: No Device    Walk 10 feet on uneven surface  activity   Assist Walk 10 feet on uneven surfaces activity did not occur: Safety/medical concerns         Wheelchair     Assist Will patient use wheelchair at discharge?: No             Wheelchair 50 feet with 2 turns activity    Assist            Wheelchair 150 feet activity     Assist          Blood pressure (!) 97/58, pulse (!) 57, temperature 97.7 F (36.5 C), temperature source Oral, resp. rate 20, height 5\' 4"  (1.626 m), weight 55.8 kg, SpO2 96 %.  Medical Problem List and  Plan: 1.  Decrease self care and mobility  secondary to Right subcortical infarct             -patient may  shower             -ELOS/Goals: 7/7 24/7 sup goals F/u neuro 4 wks post rehab dc  -Continue CIR therapies including PT, OT, and SLP - team conf in am  Dispo to home and then will move to IL apt 2.  Impaired mobility, ambulating 450 feet, d/c Lovenox             -antiplatelet therapy: ASA 81mg , and clopidigrel75mg  x 3 wk then plavix alone 3. Pain Management: Tylenol prn 4. Depression: Continue Sertraline 100mg  daily             -antipsychotic agents: none 5. Neuropsych: This patient is capable of making decisions on her own behalf would need family assistance for more complex financial and medical decisions. 6. Skin/Wound Care: monitor 7. Fluids/Electrolytes/Nutrition: low sodium HDPD 8.  HTN: continue lisinopril 10mg    Vitals:   11/10/20 1932 11/11/20 0451  BP: 100/68 (!) 97/58  Pulse: 60 (!) 57  Resp: 20 20  Temp: 98 F (36.7 C) 97.7 F (36.5 C)  SpO2: 98% 96%    remains below 100Sys would reduce lisinopril to 5 mg 9.  Mild dementia: continue Galantamine 8mg  per day 10.  HLD simvastatin 40mg  daily 11.  Vit D deficiency Ergocalciferol 50,000U once per week for 7 weeks.  12.  B12 supplemkent 543mcg daily 13. Constipation colace 1 po qd  -moved bowels 7/1        LOS: 16 days A FACE TO FACE EVALUATION WAS PERFORMED  Charlett Blake 11/11/2020, 7:21 AM

## 2020-11-11 NOTE — Progress Notes (Signed)
Occupational Therapy Session Note  Patient Details  Name: Pamela Mason MRN: [id number] Date of Birth: Dec 01, 1945  Today's Date: 11/11/2020 OT Individual Time: 0802-0902 OT Individual Time Calculation (min): 60 min    Short Term Goals: Week 2:  OT Short Term Goal 1 (Week 2): Pt will complete all aspects of UB dressing with SUP. OT Short Term Goal 2 (Week 2): Pt will complete transfers to all appropriate surfaces with SUP and LRAD. OT Short Term Goal 3 (Week 2): Pt will bathe with setup while seated in shower. OT Short Term Goal 4 (Week 2): Pt will complete all aspects of toileting with SUP.  Skilled Therapeutic Interventions/Progress Updates:    Session 1: ([phone]) Worked on selfcare retraining during session at shower level.  She was able to complete supine to sit with supervision and then transfer to the toilet with supervision and use of the RW for support.  Supervision for all aspects of toileting with supervision for completion of all bathing tasks sit to stand with use of a tub bench as well as transfer in and out of the shower with use of the grab bars for support. She worked on dressing sit to stand from the EOB at supervision level with increased time and two attempts to complete tying of her left shoe.  Grooming tasks were completed next in standing at the sink with supervision for oral hygiene and for drying and brushing her hair.  Finished session with pt sitting in the recliner with education and use of the medium resistance therapy putty for strengthening and coordination of the left hand.  Call button and phone in reach with safety alarm in place.    Session 2: [phone])  Educated pt on FM coordination tasks as well as provided handout for completion of tasks at home.  Also provided handout for therapy putty tasks to be completed daily as well, with return demonstration from patient with min instructional cueing for technique and completion. Pt's daughters present for session  and provided some education on pt's current level of supervision for most selfcare tasks with use of the RW for safety as well as shower seat and BSC as needed.  They voice understanding that pt will need 24 hr initially and have arranged for that at the independent living facility.  She was able to complete toilet transfer with use of the RW for support and elevated toilet with supervision and then ambulated to the sink for washing hands at the same level as well.  Finished session with work on picking up 3 checkers, one at a time, in the left hand and placing them in the [place] which therapist placed in different areas to maximize reach and difficulty from seated position.  She only exhibited two drops after completion of 30 checkers.  Still with slight ataxic movement noted however.     Therapy Documentation Precautions:  Precautions Precautions: Fall Precaution Comments: mild LUE and LLE ataxia Restrictions Weight Bearing Restrictions: No  Pain: Pain Assessment Pain Scale: 0-10 Pain Score: 0-No pain ADL: See CareTool Section for some details of mobility and selfcare   Therapy/Group: Individual Therapy  Markan Cazarez OTR/L 11/11/2020, 9:34 AM

## 2020-11-12 NOTE — Progress Notes (Signed)
Patient ID: Pamela Mason, female   DOB: 1945-05-31, 75 y.o.   MRN: [id number] Team Conference Report to Patient/Family  Team Conference discussion was reviewed with the patient and caregiver, including goals, any changes in plan of care and target discharge date.  Patient and caregiver express understanding and are in agreement.  The patient has a target discharge date of 11/13/20.  SW met with pt and pt daughter, provided conference updates. Pt ready for d/c. Daughter reports room is ready at [facility] and will be here for discharge on tomorrow between 8:30-8:45AM. SW will fax [place] orders to facility. No additional questions or concerns sw will con to follow up  Dyanne Iha 11/12/2020, 2:02 PM

## 2020-11-12 NOTE — Patient Care Conference (Signed)
Inpatient RehabilitationTeam Conference and Plan of Care Update Date: 11/12/2020   Time: 10:50 AM    Patient Name: Pamela Mason      Medical Record Number: [id number]  Date of Birth: 07/07/1945 Sex: Female         Room/Bed: 5C09C/5C09C-01 Payor Info: Payor: MEDICARE / Plan: MEDICARE PART A AND B / Product Type: *No Product type* /    Admit Date/Time:  10/26/2020  2:27 PM  Primary Diagnosis:  Subcortical infarction [facility])  Hospital Problems: Principal Problem:   Subcortical infarction [facility])    Expected Discharge Date: Expected Discharge Date: 11/13/20  Team Members Present: Physician leading conference: Dr. Alysia Penna Care Coodinator Present: Dorien Chihuahua, RN, BSN, CRRN;Christina [place], [place] Nurse Present: Dorien Chihuahua, RN PT Present: Page Spiro, PT OT Present: Clyda Greener, OT SLP Present: Other (comment) Herbert Deaner, SLP) PPS Coordinator present : Gunnar Fusi, SLP     Current Status/Progress Goal Weekly Team Focus  Bowel/Bladder             Swallow/Nutrition/ Hydration             ADL's   Supervision for bathing, dressing, toileting, functional mobility.  Still with slight LUE ataxia during functional reach and FM coordiantion tasks.  supervison overall  selfcare retraining, transfer training, DME education, balance retraining, therapeutic exercise, pt education   Mobility   supervision bed mobility, supervision sit<>stands and stand pivots using RW, CGA progressing to supervision gait >214ft using RW, CGA 8 steps using B HRs  supervision overall at ambulatory level  activity tolerance, L LE NMR, pt/family education, transer training, dynamic standing balance, dyanmic gait training, coordination   Communication             Safety/Cognition/ Behavioral Observations  Sup-to-Min A  Sup A  carry over of memory aids for recall. On target for discharge   Pain             Skin               Discharge Planning:  Daughters plan for patient to  d/c to [facility] (ILF)   Team Discussion: No medical issues noted, on target to meet goals for discharge. Discharge home on ASA solo.  Patient on target to meet rehab goals: yes, currently supervision overall for bathing and dressing and transfers. Mild ataxia noted however able to tie shoes.  CGA - supervision for gait >250' with a RW. Requires supervision to min assist for cognition. Supervision goals overall for discharge  *See Care Plan and progress notes for long and short-term goals.   Revisions to Treatment Plan:  Working on selective attention and memory, carry over of strategies/memory aides for carry over to ILF  Teaching Needs: Safety, memory, medications, secondary stroke risks, etc  Current Barriers to Discharge: Decreased caregiver support and Home enviroment access/layout  Possible Resolutions to Barriers: Family education ILF available at discharge     Medical Summary Current Status: Constipation improving, blood pressure on low side med adjustments have been made.  Barriers to Discharge: Other (comments)  Barriers to Discharge Comments: New living space. Possible Resolutions to [employer] Focus: Patient moving to an independent living apartment post discharge unfamiliar with environment.  Plan discharge in a.m.   Continued Need for Acute Rehabilitation Level of Care: The patient requires daily medical management by a physician with specialized training in physical medicine and rehabilitation for the following reasons: Direction of a multidisciplinary physical rehabilitation program to maximize functional independence : Yes Medical management of  patient stability for increased activity during participation in an intensive rehabilitation regime.: Yes Analysis of laboratory values and/or radiology reports with any subsequent need for medication adjustment and/or medical intervention. : Yes   I attest that I was present, lead the team conference, and concur with  the assessment and plan of the team.   Dorien Chihuahua B 11/12/2020, 11:01 AM

## 2020-11-12 NOTE — Progress Notes (Signed)
Physical Therapy Discharge Summary  Patient Details  Name: Pamela Mason MRN: [id number] Date of Birth: 01-18-1946  Today's Date: 11/12/2020 PT Individual Time: 3007-6226 PT Individual Time Calculation (min): 72 min    Patient has met 8 of 8 long term goals due to improved activity tolerance, improved balance, improved postural control, increased strength, ability to compensate for deficits, functional use of  left upper extremity and left lower extremity, improved attention, improved awareness, and improved coordination.  Patient to discharge at an ambulatory level using RW with Supervision.   Patient's care partner is independent to provide the necessary physical and cognitive assistance at discharge as pt planning to D/C to an ALF with family providing additional support for 24/7.  All goals met.  Recommendation:  Patient will benefit from ongoing skilled PT services in home health setting to continue to advance safe functional mobility, address ongoing impairments in L LE coordination and strengthening, standing balance, gait training with LRAD, activity tolerance, and minimize fall risk.  Equipment: No equipment provided, pt already has RW  Reasons for discharge: treatment goals met and discharge from hospital  Patient/family agrees with progress made and goals achieved: Yes  Skilled Therapeutic Interventions/Progress Updates:  Pt received supine in bed and agreeable to therapy session. Supine>sitting EOB, no bed features, independently. Sit<>stands using RW with supervision throughout session. Gait training >268f using RW down to main therapy gym including on/off elevator with close supervision for safety - pt demos decreased L knee flexion during swing with excessive hip internal rotation due to impaired hip strength, cuing for improvement. Performed the below exercises 3 reps with pt self-leading the final set of lying exercises as pt instructed that she is safe to perform these  without physical assistance; however, requires assistance for the standing exercises for balance safety. Therapist providing pt verbal and written cuing/education for proper form/technique of each exercise - provided on HEP printout. Stair navigation training focusing on L LE NMR via ascending/descending with reciprocal stepping pattern using B HRs. Performed simulated car transfer, sedan height, using RW with supervision and pt demoing safe technique without cuing. Gait training back to room using RW as described above. Pt left seated in recliner with needs in reach and chair alarm on.  Access Code: [email or web address] URL: [email or web address] Date: 11/12/2020 Prepared by: CPage Spiro Exercises Supine Bridge - 1 x daily - 7 x weekly - 1 sets - 15 reps Single Leg Bridge - 1 x daily - 7 x weekly - 2 sets - 15 reps Clamshell - 1 x daily - 7 x weekly - 2 sets - 15 reps Sidelying Hip Abduction - 1 x daily - 7 x weekly - 2 sets - 15 reps Supine Heel Slide - 1 x daily - 7 x weekly - 2 sets - 15 reps Side Stepping with Counter Support - 1 x daily - 7 x weekly - 2 sets - 10 reps Standing Knee Flexion with Counter Support - 1 x daily - 7 x weekly - 2 sets - 20 reps    PT Discharge Precautions/Restrictions Precautions Precautions: Fall;Other (comment) Precaution Comments: mild LUE and LLE ataxia Restrictions Weight Bearing Restrictions: No Pain Pain Assessment Pain Scale: 0-10 Pain Score: 0-No pain Perception  Perception: Within Functional Limits Praxis Praxis: Intact  Cognition  Overall Cognitive Status: Impaired/Different from baseline Arousal/Alertness: Awake/alert Orientation Level: Oriented X4 Attention: Focused;Sustained Focused Attention: Appears intact Sustained Attention: Appears intact Memory: Impaired (impairments at baseline) Safety/Judgment: Appears intact Sensation Sensation Light Touch: Appears Intact Hot/Cold:  Not tested Proprioception: Impaired by gross  assessment (in L LE) Stereognosis: Not tested Coordination Gross Motor Movements are Fluid and Coordinated: No Coordination and Movement Description: continues to have minor impairments in L LE coordination Motor  Motor Motor: Hemiplegia;Ataxia Motor - Discharge Observations: Pt still with mild LUE and LLE hemiparesis and ataxia.  Mobility Bed Mobility Bed Mobility: Sit to Supine;Supine to Sit Supine to Sit: Independent with assistive device Sit to Supine: Independent with assistive device Transfers Transfers: Sit to Stand;Stand to Sit;Stand Pivot Transfers Sit to Stand: Supervision/Verbal cueing Stand to Sit: Supervision/Verbal cueing Stand Pivot Transfers: Supervision/Verbal cueing Transfer (Assistive device): Rolling walker Locomotion  Gait Ambulation: Yes Gait Assistance: Supervision/Verbal cueing Gait Distance (Feet): 300 Feet Assistive device: Rolling walker Gait Assistance Details: Verbal cues for safe use of DME/AE;Verbal cues for gait pattern Gait Gait: Yes Gait Pattern: Impaired Gait Pattern: Decreased hip/knee flexion - left;Step-through pattern;Poor foot clearance - left Gait velocity: decreased, though significantly improved Stairs / Additional Locomotion Stairs: Yes Stairs Assistance: Contact Guard/Touching assist Stair Management Technique: Two rails Number of Stairs: 12 Height of Stairs: 6 Ramp: Supervision/Verbal cueing (using RW) Curb: Contact Guard/Touching assist (using RW) Wheelchair Mobility Wheelchair Mobility: No  Trunk/Postural Assessment  Cervical Assessment Cervical Assessment: Exceptions to [facility] (cervical protraction) Thoracic Assessment Thoracic Assessment: Exceptions to [facility] (thoracic kyphosis) Lumbar Assessment Lumbar Assessment: Exceptions to [facility] (posterior pelvic tilt) Postural Control Postural Control: Within Functional Limits  Balance Balance Balance Assessed: Yes Standardized Balance Assessment Standardized Balance Assessment:  Berg Balance Test Berg Balance Test Sit to Stand: Able to stand  independently using hands Standing Unsupported: Able to stand safely 2 minutes Sitting with Back Unsupported but Feet Supported on Floor or Stool: Able to sit safely and securely 2 minutes Stand to Sit: Sits safely with minimal use of hands Transfers: Able to transfer safely, minor use of hands Standing Unsupported with Eyes Closed: Able to stand 10 seconds safely Standing Ubsupported with Feet Together: Able to place feet together independently and stand for 1 minute with supervision (minor sway) From Standing, Reach Forward with Outstretched Arm: Can reach confidently >25 cm (10") From Standing Position, Pick up Object from Floor: Able to pick up shoe, needs supervision (minor sway) From Standing Position, Turn to Look Behind Over each Shoulder: Turn sideways only but maintains balance Turn 360 Degrees: Able to turn 360 degrees safely but slowly Standing Unsupported, Alternately Place Feet on Step/Stool: Able to complete >2 steps/needs minimal assist (requires close supervision/CGA) Standing Unsupported, One Foot in Front: Able to take small step independently and hold 30 seconds Standing on One Leg: Able to lift leg independently and hold equal to or more than 3 seconds Total Score: 42 Static Sitting Balance Static Sitting - Balance Support: Feet supported Static Sitting - Level of Assistance: 7: Independent Dynamic Sitting Balance Dynamic Sitting - Balance Support: During functional activity Dynamic Sitting - Level of Assistance: 6: Modified independent (Device/Increase time) Static Standing Balance Static Standing - Balance Support: During functional activity;Bilateral upper extremity supported Static Standing - Level of Assistance: 5: Stand by assistance Dynamic Standing Balance Dynamic Standing - Balance Support: During functional activity;Bilateral upper extremity supported Dynamic Standing - Level of Assistance:  Other (comment);5: Stand by assistance (CGA) Extremity Assessment      RLE Assessment RLE Assessment: Within Functional Limits General Strength Comments: assessed in sitting 5/5 LLE Assessment LLE Assessment: Exceptions to [facility] Active Range of Motion (AROM) Comments: WFL LLE Strength Left Hip Flexion: 4-/5 Left Hip ABduction: 4-/5 Left Hip ADduction:  4-/5 Left Knee Flexion: 4-/5 Left Knee Extension: 4-/5 Left Ankle Dorsiflexion: 3+/5 Left Ankle Plantar Flexion: 3+/5    Tawana Scale , PT, DPT, NCS, CSRS 11/12/2020, 3:54 PM

## 2020-11-12 NOTE — Progress Notes (Signed)
PROGRESS NOTE   Subjective/Complaints:  Patient feels well today.  Looking forward to going home tomorrow.  ROS: Patient denies CP, SOB, N/V/D    Objective:   No results found. No results for input(s): WBC, HGB, HCT, PLT in the last 72 hours.   No results for input(s): NA, K, CL, CO2, GLUCOSE, BUN, CREATININE, CALCIUM in the last 72 hours.    Intake/Output Summary (Last 24 hours) at 11/12/2020 0905 Last data filed at 11/12/2020 0801 Gross per 24 hour  Intake 712 ml  Output --  Net 712 ml         Physical Exam: Vital Signs Blood pressure 102/63, pulse (!) 52, temperature 97.7 F (36.5 C), temperature source Oral, resp. rate 18, height 5' 4"  (1.626 m), weight 55.8 kg, SpO2 96 %.  General: No acute distress Mood and affect are appropriate Heart: Regular rate and rhythm no rubs murmurs or extra sounds Lungs: Clear to auscultation, breathing unlabored, no rales or wheezes Abdomen: Positive bowel sounds, soft nontender to palpation, nondistended Extremities: No clubbing, cyanosis, or edema Skin: No evidence of breakdown, no evidence of rash  Neurological:    Mental Status: She is alert and oriented to person, place, and time.    Motor: Weakness present.    Decreased coordination LUE and LLE with FTN, HTS    Comments: Motor 5/5 Bilateral but has reduced motor control on L    Assessment/Plan: 1. Functional deficits which require 3+ hours per day of interdisciplinary therapy in a comprehensive inpatient rehab setting. Physiatrist is providing close team supervision and 24 hour management of active medical problems listed below. Physiatrist and rehab team continue to assess barriers to discharge/monitor patient progress toward functional and medical goals  Care Tool:  Bathing    Body parts bathed by patient: Right arm, Left arm, Chest, Abdomen, Right upper leg, Left upper leg, Right lower leg, Left lower leg, Face,  Front perineal area, Buttocks   Body parts bathed by helper: Front perineal area, Buttocks     Bathing assist Assist Level: Supervision/Verbal cueing     Upper Body Dressing/Undressing Upper body dressing   What is the patient wearing?: Pull over shirt    Upper body assist Assist Level: Set up assist    Lower Body Dressing/Undressing Lower body dressing      What is the patient wearing?: Pants, Underwear/pull up     Lower body assist Assist for lower body dressing: Supervision/Verbal cueing     Toileting Toileting    Toileting assist Assist for toileting: Supervision/Verbal cueing     Transfers Chair/bed transfer  Transfers assist     Chair/bed transfer assist level: Supervision/Verbal cueing Chair/bed transfer assistive device: Programmer, multimedia   Ambulation assist      Assist level: Contact Guard/Touching assist Assistive device: Walker-rolling Max distance: 299f   Walk 10 feet activity   Assist     Assist level: Contact Guard/Touching assist Assistive device: Walker-rolling   Walk 50 feet activity   Assist Walk 50 feet with 2 turns activity did not occur: Safety/medical concerns  Assist level: Contact Guard/Touching assist Assistive device: Walker-rolling    Walk 150 feet activity  Assist Walk 150 feet activity did not occur: Safety/medical concerns  Assist level: Contact Guard/Touching assist Assistive device: Walker-rolling    Walk 10 feet on uneven surface  activity   Assist Walk 10 feet on uneven surfaces activity did not occur: Safety/medical concerns         Wheelchair     Assist Will patient use wheelchair at discharge?: No             Wheelchair 50 feet with 2 turns activity    Assist            Wheelchair 150 feet activity     Assist          Blood pressure 102/63, pulse (!) 52, temperature 97.7 F (36.5 C), temperature source Oral, resp. rate 18, height 5' 4"  (1.626  m), weight 55.8 kg, SpO2 96 %.  Medical Problem List and Plan: 1.  Decrease self care and mobility  secondary to Right subcortical infarct             -patient may  shower             -ELOS/Goals: 7/7 24/7 sup goals F/u neuro 4 wks post rehab dc  -Continue CIR therapies including PT, OT, and SLP - Team conference today please see physician documentation under team conference tab, met with team  to discuss problems,progress, and goals. Formulized individual treatment plan based on medical history, underlying problem and comorbidities.  Dispo to [place] apt, [place] 2.  Impaired mobility, ambulating 450 feet, d/c Lovenox             -antiplatelet therapy: ASA 62m, and [id number] 3 wk then plavix alone 3. Pain Management: Tylenol prn 4. Depression: Continue Sertraline 10047maily             -antipsychotic agents: none 5. Neuropsych: This patient is capable of making decisions on her own behalf would need family assistance for more complex financial and medical decisions. 6. Skin/Wound Care: monitor 7. Fluids/Electrolytes/Nutrition: low sodium HDPD 8.  HTN: continue lisinopril 40m62mVitals:   11/11/20 1940 11/12/20 0440  BP: 97/60 102/63  Pulse: (!) 57 (!) 52  Resp: 18 18  Temp: 97.9 F (36.6 C) 97.7 F (36.5 C)  SpO2: 96% 96%    remains below 100Sys would reduce lisinopril to 5 mg 9.  Mild dementia: continue Galantamine 8mg 33m day 10.  HLD simvastatin 40mg 9my 11.  Vit D deficiency Ergocalciferol 50,000U once per week for 7 weeks.  12.  B12 supplemkent 500mcg 71my 13. Constipation colace 1 po qd  -moved bowels 7/1 and on 11/12/2020        LOS: 17 days A FACE TO FACE EVOphirteins 11/12/2020, 9:05 AM

## 2020-11-12 NOTE — Progress Notes (Signed)
Recreational Therapy Discharge Summary Patient Details  Name: Kamill W Wisz MRN: [id number] Date of Birth: 03/19/1946 Today's Date: 11/12/2020  Long term goals set: 1  Long term goals met: 1  Comments on progress toward goals: Pt has made excellent progress during LOS and is ready for discharge tomorrow at supervision level.  TR sessions/education focused on activity analysis/modifications, energy conservation, coping strategies, various aspects of health and wellness (including emotional, social, spiritual).   Reasons for discharge: discharge from hospital  Follow-up:  Encouraged participation in social opportunities and activities programming at ILF   Patient/family agrees with progress made and goals achieved: Yes  , 11/12/2020, 3:51 PM   

## 2020-11-12 NOTE — Progress Notes (Signed)
Occupational Therapy Discharge Summary  Patient Details  Name: Pamela Mason MRN: [id number] Date of Birth: Aug 08, 1945  Today's Date: 11/12/2020 OT Individual Time: 1404-1430 OT Individual Time Calculation (min): 26 min   Session Note:  Pt worked on donning her shoes from the EOB to begin session with setup.  She then completed transfer stand pivot to the wheelchair at min assist and was pushed down to the ortho gym for work on LUE strengthening.  She was able to complete 4 sets on the UE ergonometer, with 3 of them completed in standing.  The first set was performed in standing for 3 mins on Random Program setting with use of BUEs.  Resistance was set on level 6 with RPMs maintained at level 25.  After a brief rest, she continued in standing for 2 sets with isolated use of the LUE only and RPMs at 12--19.  Noted greater difficulty with use of the LUE only with pt reporting greater fatigue.  The first set was completed for 3 mins with the 2nd set for 2 mins after a brief rest period.  Finished session with return to the room and pt transferring to the bed at min assist with no device to rest.  Modified independent for sit to supine.  Call button and phone in reach at end of session.   Patient has met 12 of 12 long term goals due to improved balance, postural control, ability to compensate for deficits, functional use of  LEFT upper and LEFT lower extremity, and improved coordination.  Patient to discharge at overall Supervision level.  Patient's care partner is independent to provide the necessary physical and cognitive assistance at discharge.    Reasons goals not met: NA  Recommendation:  Patient will benefit from ongoing skilled OT services in home health setting to continue to advance functional skills in the area of BADL, iADL, and Reduce care partner burden.  Pt continues with LUE and LLE ataxia and decreased functional movement.  Overall, she is able to complete most selfcare tasks at a  supervision level approaching modified independent for some simple tasks.  Currently however, due to her STM deficits and decreased balance, feel it will be safest for her to have initial 24 hour supervision and continued HHOT to progress to a safer level of independence.    Equipment: No equipment provided  Reasons for discharge: treatment goals met and discharge from hospital  Patient/family agrees with progress made and goals achieved: Yes  OT Discharge Precautions/Restrictions  Precautions Precautions: Fall Precaution Comments: mild LUE and LLE ataxia Restrictions Weight Bearing Restrictions: No  Pain Pain Assessment Pain Scale: 0-10 Pain Score: 0-No pain ADL ADL Eating: Independent Where Assessed-Eating: Chair Grooming: Independent Where Assessed-Grooming: Sitting at sink Upper Body Bathing: Independent Where Assessed-Upper Body Bathing: Chair, Shower Lower Body Bathing: Supervision/safety Where Assessed-Lower Body Bathing: Chair, Administrator, sports Dressing: Modified independent (Device) Where Assessed-Upper Body Dressing: Edge of bed Lower Body Dressing: Supervision/safety Where Assessed-Lower Body Dressing: Edge of bed Toileting: Supervision/safety Where Assessed-Toileting: Glass blower/designer: Close supervision Armed forces technical officer Method: Counselling psychologist: Radiographer, therapeutic: Not assessed Social research officer, government: Close supervision Social research officer, government Method: Heritage manager: Grab bars Vision Baseline Vision/History: Wears glasses Wears Glasses: At all times Patient Visual Report: No change from baseline Vision Assessment?: Yes Alignment/Gaze Preference: Within Defined Limits Tracking/Visual Pursuits: Able to track stimulus in all quads without difficulty Convergence: Within functional limits Visual Fields: No apparent deficits Perception  Perception: Within Functional  Limits Praxis Praxis:  Intact Cognition Overall Cognitive Status: Impaired/Different from baseline Arousal/Alertness: Awake/alert Orientation Level: Oriented X4 Attention: Focused;Sustained;Selective Focused Attention: Appears intact Sustained Attention: Appears intact Selective Attention: Impaired Selective Attention Impairment: Functional basic Memory: Impaired Memory Impairment: Storage deficit;Decreased recall of new information;Decreased short term memory Decreased Short Term Memory: Functional basic Awareness Impairment: Anticipatory impairment Problem Solving: Impaired Problem Solving Impairment: Functional complex;Verbal complex Safety/Judgment: Impaired Comments: Pt still needs min instructional cueing for walker safety when completing transitions sit to stand. Sensation Sensation Light Touch: Impaired Detail Light Touch Impaired Details: Impaired LUE Hot/Cold: Appears Intact Proprioception: Impaired Detail Proprioception Impaired Details: Impaired LUE Stereognosis: Not tested Additional Comments: Pt still with decreased proprioception and light touch in the left hand and digits. Coordination Gross Motor Movements are Fluid and Coordinated: No Fine Motor Movements are Fluid and Coordinated: No Coordination and Movement Description: Pt continues with slight FM and gross motor coordination deficits in the LUE compared to the right but uses functionally at a non-dominnant level during selfcare tasks. 9 Hole Peg Test: L= 34, R=22 Motor  Motor Motor: Hemiplegia;Ataxia;Abnormal postural alignment and control Motor - Discharge Observations: Pt still with mild LUE and LLE hemiparesis and ataxia. Mobility  Bed Mobility Bed Mobility: Sit to Supine;Supine to Sit Supine to Sit: Independent with assistive device Sit to Supine: Independent with assistive device Transfers Sit to Stand: Supervision/Verbal cueing Stand to Sit: Supervision/Verbal cueing  Trunk/Postural Assessment  Cervical  Assessment Cervical Assessment: Exceptions to [facility] (cervical protraction at rest) Thoracic Assessment Thoracic Assessment: Exceptions to [facility] (slight thoracic kyphosis and left scapular winging with AROM) Lumbar Assessment Lumbar Assessment: Exceptions to [facility] (slight posterior pelvic tilt)  Balance Balance Balance Assessed: Yes Static Sitting Balance Static Sitting - Balance Support: Feet supported Static Sitting - Level of Assistance: 7: Independent Dynamic Sitting Balance Dynamic Sitting - Balance Support: During functional activity Dynamic Sitting - Level of Assistance: 6: Modified independent (Device/Increase time) Static Standing Balance Static Standing - Balance Support: During functional activity Static Standing - Level of Assistance: 5: Stand by assistance Dynamic Standing Balance Dynamic Standing - Balance Support: During functional activity Dynamic Standing - Level of Assistance: 4: Min assist Extremity/Trunk Assessment RUE Assessment RUE Assessment: Within Functional Limits LUE Assessment LUE Assessment: Exceptions to [facility] Active Range of Motion (AROM) Comments: WFLS for all joints General Strength Comments: Strength 4-/5 throughout with increased ataxia noted with finger to nose as well as decreased FM coordination when tying shoes.  Improved since initial evaluation and she now uses it at a non-dominant level with selfcare tasks.   Quint Chestnut OTR/L 11/12/2020, 5:01 PM

## 2020-11-12 NOTE — Progress Notes (Signed)
Speech Language Pathology Discharge Summary  Patient Details  Name: Pamela Mason MRN: [id number] Date of Birth: 1945/05/21  Today's Date: 11/12/2020 SLP Individual Time: 8250-5397 SLP Individual Time Calculation (min): 45 min   Skilled Therapeutic Interventions: Skilled ST intervention performed with focus on cognitive goals. Facilitated use of patient's memory notebook by writing down details for tomorrow's discharge. Facilitated verbal reasoning task using [place] game with min A for selective attention within mildly distracting environment (soft noise from hallway, people occasionally entering room) and additional processing time. Completed education discussing cognitive strategies to enhance attention to task and functional recall with her environment and with continued use of external aids. Patient displayed understanding through teach back. All education completed and patient and family in agreement with scheduled discharge tomorrow. Patient was left in recliner chair with alarm activated and needs within reach.    Patient has met 4 of 4 long term goals. Patient to discharge at overall Supervision;Min level.  Reasons goals not met: NA   Clinical Impression/Discharge Summary: Patient demonstrates excellent progress toward goals and met 4 of 4 LTGs this admission due to improved cognition. Patient is currently an overall supervision-to-min A for cognitive tasks and requires supervision A verbal cues for utilization of compensatory memory strategies including use of memory notebook and calendar, and sup-to-min A for selective attention during activities within mildly distracting environments (e.g., soft background noise or conversation) and problem solving. She continues to benefit from additional processing time for comprehension of task instructions, commands, and at times to respond during conversation. Patient and family education is complete and patient will discharge to independent  living with caregiver support and 24/7 supervision. Patient would benefit from follow-up [facility] SLP services to further maximize attention, problem solving, safety awareness within new living environment, and carry over of memory strategies in order to maximize her functional independence. Patient and family in agreement.   Care Partner:  Caregiver Able to Provide Assistance: Other (comment) (Discharging to independent living with support for medication management and assist as needed)  Type of Caregiver Assistance: Cognitive;Physical  Recommendation:  24 hour supervision/assistance;Home Health SLP  Rationale for SLP Follow Up: Maximize cognitive function and independence   Equipment: NA   Reasons for discharge: Treatment goals met   Patient/Family Agrees with Progress Made and Goals Achieved: Yes    Pamela Mason 11/12/2020, 6:21 AM

## 2020-11-12 NOTE — Progress Notes (Signed)
Occupational Therapy Session Note  Patient Details  Name: Pamela Mason MRN: [id number] Date of Birth: 26-Apr-1946  Today's Date: 11/12/2020 OT Individual Time: 9390-3009 OT Individual Time Calculation (min): 54 min    Short Term Goals: Week 2:  OT Short Term Goal 1 (Week 2): Pt will complete all aspects of UB dressing with SUP. OT Short Term Goal 2 (Week 2): Pt will complete transfers to all appropriate surfaces with SUP and LRAD. OT Short Term Goal 3 (Week 2): Pt will bathe with setup while seated in shower. OT Short Term Goal 4 (Week 2): Pt will complete all aspects of toileting with SUP.  Skilled Therapeutic Interventions/Progress Updates:    Pt in recliner to start session.  She completed functional mobility down to the therapy gym with supervision and use of the RW for support.  Decreased efficiency noted when trying to advance the LLE with occasional brushing off the foot on the floor, requiring cueing to slow down and to clear the foot more.  Once in the therapy gym she worked on [employer] coordination with use of the [employer].  She was able to complete this in 22 seconds with the right hand and 34 seconds with the left.  Also worked on standing balance and LUE coordination by having her reach with her left hand to retrieve playing cards from different heights and match them to the cards on the board.  She was able to complete with min guard assist.  Finished with work on standing and stepping up on a step and collapsible cone with the RLE while having to stand on her left leg.  Min to mod assist for balance with increased activation at the left knee and hip noted.  Returned to the room at end of session with pt left sitting in the recliner with the call button and phone in reach and safety alarm pad in place.    Therapy Documentation Precautions:  Precautions Precautions: Fall Precaution Comments: mild LUE and LLE ataxia Restrictions Weight Bearing Restrictions:  No  Pain: Pain Assessment Pain Scale: Faces Pain Score: 0-No pain Faces Pain Scale: No hurt ADL: See Care Tool Section for some details of mobility and selfcare   Therapy/Group: Individual Therapy  Natlie Asfour OTR/L 11/12/2020, 12:44 PM

## 2020-11-13 DIAGNOSIS — F0390 Unspecified dementia without behavioral disturbance: Secondary | ICD-10-CM

## 2020-11-13 MED ORDER — LISINOPRIL 5 MG PO TABS: 5.0000 mg | ORAL_TABLET | Freq: Every day | ORAL | 0 refills | Status: DC

## 2020-11-13 MED ORDER — DOCUSATE SODIUM 100 MG PO CAPS: 100.0000 mg | ORAL_CAPSULE | Freq: Two times a day (BID) | ORAL | 0 refills | Status: DC

## 2020-11-13 MED ORDER — CLOPIDOGREL BISULFATE 75 MG PO TABS: 75.0000 mg | ORAL_TABLET | Freq: Every day | ORAL | 0 refills | Status: DC

## 2020-11-13 MED ORDER — POLYETHYLENE GLYCOL 3350 17 G PO PACK: 17.0000 g | PACK | Freq: Every day | ORAL | 0 refills | Status: DC | PRN

## 2020-11-13 MED ORDER — ASPIRIN 81 MG PO TBEC: 81.0000 mg | DELAYED_RELEASE_TABLET | Freq: Every day | ORAL | 11 refills | Status: AC

## 2020-11-13 MED ORDER — VITAMIN D (ERGOCALCIFEROL) 1.25 MG (50000 UNIT) PO CAPS: 50000.0000 [IU] | ORAL_CAPSULE | ORAL | Status: AC

## 2020-11-13 MED ORDER — ACETAMINOPHEN 325 MG PO TABS: 325.0000 mg | ORAL_TABLET | ORAL | Status: AC | PRN

## 2020-11-13 NOTE — Progress Notes (Signed)
Inpatient Rehabilitation Care Coordinator Discharge Note  The overall goal for the admission was met for:   Discharge location: Yes, ILF ([facility])  Length of Stay: Yes, 18 Days  Discharge activity level: Yes, MOD I/ SUP  Home/community participation: Yes  Services provided included: MD, RD, PT, OT, SLP, RN, CM, TR, Pharmacy, and SW  Financial Services: Medicare  Choices offered to/list presented SK:[id number] and daughters  Follow-up services arranged: Home Health: [facility]  Comments (or additional information): PT OT ST  NO DME  Patient/Family verbalized understanding of follow-up arrangements: Yes  Individual responsible for coordination of the follow-up plan: [name] [phone]  Confirmed correct DME delivered: Dyanne Iha 11/13/2020    Dyanne Iha

## 2020-11-13 NOTE — Progress Notes (Signed)
PROGRESS NOTE   Subjective/Complaints:  No issues overnite   ROS: Patient denies CP, SOB, N/V/D    Objective:   No results found. No results for input(s): WBC, HGB, HCT, PLT in the last 72 hours.   No results for input(s): NA, K, CL, CO2, GLUCOSE, BUN, CREATININE, CALCIUM in the last 72 hours.    Intake/Output Summary (Last 24 hours) at 11/13/2020 0754 Last data filed at 11/13/2020 0649 Gross per 24 hour  Intake 600 ml  Output --  Net 600 ml         Physical Exam: Vital Signs Blood pressure 92/62, pulse (!) 59, temperature 97.6 F (36.4 C), resp. rate 16, height 5\' 4"  (1.626 m), weight 55.8 kg, SpO2 97 %.  General: No acute distress Mood and affect are appropriate Heart: Regular rate and rhythm no rubs murmurs or extra sounds Lungs: Clear to auscultation, breathing unlabored, no rales or wheezes Abdomen: Positive bowel sounds, soft nontender to palpation, nondistended Extremities: No clubbing, cyanosis, or edema Skin: No evidence of breakdown, no evidence of rash  Neurological:    Mental Status: She is alert and oriented to person, place, and time.    Motor: Weakness present.    Decreased coordination LUE and LLE with FTN, HTS    Comments: Motor 5/5 Bilateral but has reduced motor control on L    Assessment/Plan: 1. Functional deficits due to CVA Stable for D/C today F/u PCP in 3-4 weeks F/u PM&R 2 weeks See D/C summary See D/C instructions   Care Tool:  Bathing    Body parts bathed by patient: Right arm, Left arm, Chest, Abdomen, Right upper leg, Left upper leg, Right lower leg, Left lower leg, Face, Front perineal area, Buttocks   Body parts bathed by helper: Front perineal area, Buttocks     Bathing assist Assist Level: Supervision/Verbal cueing     Upper Body Dressing/Undressing Upper body dressing   What is the patient wearing?: Pull over shirt    Upper body assist Assist Level: Set up  assist    Lower Body Dressing/Undressing Lower body dressing      What is the patient wearing?: Pants, Underwear/pull up     Lower body assist Assist for lower body dressing: Supervision/Verbal cueing     Toileting Toileting    Toileting assist Assist for toileting: Supervision/Verbal cueing     Transfers Chair/bed transfer  Transfers assist     Chair/bed transfer assist level: Supervision/Verbal cueing Chair/bed transfer assistive device: Programmer, multimedia   Ambulation assist      Assist level: Supervision/Verbal cueing Assistive device: Walker-rolling Max distance: 332ft   Walk 10 feet activity   Assist     Assist level: Supervision/Verbal cueing Assistive device: Walker-rolling   Walk 50 feet activity   Assist Walk 50 feet with 2 turns activity did not occur: Safety/medical concerns  Assist level: Supervision/Verbal cueing Assistive device: Walker-rolling    Walk 150 feet activity   Assist Walk 150 feet activity did not occur: Safety/medical concerns  Assist level: Supervision/Verbal cueing Assistive device: Walker-rolling    Walk 10 feet on uneven surface  activity   Assist Walk 10 feet on uneven surfaces  activity did not occur: Safety/medical concerns   Assist level: Supervision/Verbal cueing Assistive device: Aeronautical engineer Will patient use wheelchair at discharge?: No             Wheelchair 50 feet with 2 turns activity    Assist            Wheelchair 150 feet activity     Assist          Blood pressure 92/62, pulse (!) 59, temperature 97.6 F (36.4 C), resp. rate 16, height 5\' 4"  (1.626 m), weight 55.8 kg, SpO2 97 %.  Medical Problem List and Plan: 1.  Decrease self care and mobility  secondary to Right subcortical infarct             -patient may  shower             -ELOS/Goals: 7/7 24/7 sup goals F/u neuro 4 wks post rehab dc  -Continue CIR therapies  including PT, OT, and SLP -  Dispo to IL apt, [place] 2.  Impaired mobility, ambulating 450 feet, d/c Lovenox             -antiplatelet therapy: ASA 81mg , and clopidigrel75mg  x 3 wk then plavix alone 3. Pain Management: Tylenol prn 4. Depression: Continue Sertraline 100mg  daily             -antipsychotic agents: none 5. Neuropsych: This patient is capable of making decisions on her own behalf would need family assistance for more complex financial and medical decisions. 6. Skin/Wound Care: monitor 7. Fluids/Electrolytes/Nutrition: low sodium HDPD 8.  HTN: continue lisinopril 10mg    Vitals:   11/12/20 1943 11/13/20 0532  BP: 102/68 92/62  Pulse: 62 (!) 59  Resp:  16  Temp:  97.6 F (36.4 C)  SpO2: 100% 97%    remains below 100Sys would reduce lisinopril to 5 mg 9.  Mild dementia: continue Galantamine 8mg  per day 10.  HLD simvastatin 40mg  daily 11.  Vit D deficiency Ergocalciferol 50,000U once per week for 7 weeks.  12.  B12 supplemkent 573mcg daily 13. Constipation colace 1 po qd  -moved bowels 7/1 and on 11/12/2020        LOS: 18 days A FACE TO [facility] 11/13/2020, 7:54 AM

## 2020-11-14 ENCOUNTER — Telehealth: Payer: Self-pay

## 2020-11-14 DIAGNOSIS — R2689 Other abnormalities of gait and mobility: Secondary | ICD-10-CM | POA: Diagnosis not present

## 2020-11-14 DIAGNOSIS — I69354 Hemiplegia and hemiparesis following cerebral infarction affecting left non-dominant side: Secondary | ICD-10-CM | POA: Diagnosis not present

## 2020-11-14 DIAGNOSIS — R41841 Cognitive communication deficit: Secondary | ICD-10-CM | POA: Diagnosis not present

## 2020-11-14 DIAGNOSIS — R278 Other lack of coordination: Secondary | ICD-10-CM | POA: Diagnosis not present

## 2020-11-14 DIAGNOSIS — I69328 Other speech and language deficits following cerebral infarction: Secondary | ICD-10-CM | POA: Diagnosis not present

## 2020-11-14 DIAGNOSIS — R2681 Unsteadiness on feet: Secondary | ICD-10-CM | POA: Diagnosis not present

## 2020-11-14 DIAGNOSIS — M6281 Muscle weakness (generalized): Secondary | ICD-10-CM | POA: Diagnosis not present

## 2020-11-14 NOTE — Telephone Encounter (Signed)
Transition Care Management Unsuccessful Follow-up Telephone Call  Date of discharge and from where:  Transitional Care Call--who you spoke with Clarise Cruz daughter    Are you/is patient experiencing any problems since coming home? Are there any questions regarding any aspect of care? No daughter states shes doing good. Are there any questions regarding medications administration/dosing? Are meds being taken as prescribed? Patient should review meds with caller to confirm. No  Have there been any falls? No  Has Home Health been to the house and/or have they contacted you? If not, have you tried to contact them? Can we help you contact them? Yes home health contacted the patient . Are bowels and bladder emptying properly? Are there any unexpected incontinence issues? If applicable, is patient following bowel/bladder programs? Everything is normal. Any fevers, problems with breathing, unexpected pain? no Are there any skin problems or new areas of breakdown? no Has the patient/family member arranged specialty MD follow up (ie cardiology/neurology/renal/surgical/etc)?  Can we help arrange? Yes daughter was going to make an appt with PCP. Does the patient need any other services or support that we can help arrange? NO Are caregivers following through as expected in assisting the patient? Yes she has a caregiver .        11. Has the patient quit smoking, drinking alcohol, or using drugs as recommended? No smkoing or alcohol.  Appointment Date 11/25/2020 /Time 1pm / Arrival time 12:40pm / and who they are seeing NP [place] [address]   Attempts:  1st Attempt

## 2020-11-17 DIAGNOSIS — M6281 Muscle weakness (generalized): Secondary | ICD-10-CM | POA: Diagnosis not present

## 2020-11-17 DIAGNOSIS — R41841 Cognitive communication deficit: Secondary | ICD-10-CM | POA: Diagnosis not present

## 2020-11-17 DIAGNOSIS — Z20828 Contact with and (suspected) exposure to other viral communicable diseases: Secondary | ICD-10-CM | POA: Diagnosis not present

## 2020-11-17 DIAGNOSIS — I69328 Other speech and language deficits following cerebral infarction: Secondary | ICD-10-CM | POA: Diagnosis not present

## 2020-11-17 DIAGNOSIS — R2689 Other abnormalities of gait and mobility: Secondary | ICD-10-CM | POA: Diagnosis not present

## 2020-11-17 DIAGNOSIS — R278 Other lack of coordination: Secondary | ICD-10-CM | POA: Diagnosis not present

## 2020-11-17 DIAGNOSIS — Z1159 Encounter for screening for other viral diseases: Secondary | ICD-10-CM | POA: Diagnosis not present

## 2020-11-17 DIAGNOSIS — R2681 Unsteadiness on feet: Secondary | ICD-10-CM | POA: Diagnosis not present

## 2020-11-18 DIAGNOSIS — M6281 Muscle weakness (generalized): Secondary | ICD-10-CM | POA: Diagnosis not present

## 2020-11-18 DIAGNOSIS — R2689 Other abnormalities of gait and mobility: Secondary | ICD-10-CM | POA: Diagnosis not present

## 2020-11-18 DIAGNOSIS — R278 Other lack of coordination: Secondary | ICD-10-CM | POA: Diagnosis not present

## 2020-11-18 DIAGNOSIS — I69328 Other speech and language deficits following cerebral infarction: Secondary | ICD-10-CM | POA: Diagnosis not present

## 2020-11-18 DIAGNOSIS — R2681 Unsteadiness on feet: Secondary | ICD-10-CM | POA: Diagnosis not present

## 2020-11-18 DIAGNOSIS — R41841 Cognitive communication deficit: Secondary | ICD-10-CM | POA: Diagnosis not present

## 2020-11-19 DIAGNOSIS — R278 Other lack of coordination: Secondary | ICD-10-CM | POA: Diagnosis not present

## 2020-11-19 DIAGNOSIS — I69328 Other speech and language deficits following cerebral infarction: Secondary | ICD-10-CM | POA: Diagnosis not present

## 2020-11-19 DIAGNOSIS — I639 Cerebral infarction, unspecified: Secondary | ICD-10-CM | POA: Diagnosis not present

## 2020-11-19 DIAGNOSIS — R2689 Other abnormalities of gait and mobility: Secondary | ICD-10-CM | POA: Diagnosis not present

## 2020-11-19 DIAGNOSIS — E785 Hyperlipidemia, unspecified: Secondary | ICD-10-CM | POA: Diagnosis not present

## 2020-11-19 DIAGNOSIS — R413 Other amnesia: Secondary | ICD-10-CM | POA: Diagnosis not present

## 2020-11-19 DIAGNOSIS — R2681 Unsteadiness on feet: Secondary | ICD-10-CM | POA: Diagnosis not present

## 2020-11-19 DIAGNOSIS — R41841 Cognitive communication deficit: Secondary | ICD-10-CM | POA: Diagnosis not present

## 2020-11-19 DIAGNOSIS — I10 Essential (primary) hypertension: Secondary | ICD-10-CM | POA: Diagnosis not present

## 2020-11-19 DIAGNOSIS — F329 Major depressive disorder, single episode, unspecified: Secondary | ICD-10-CM | POA: Diagnosis not present

## 2020-11-19 DIAGNOSIS — M6281 Muscle weakness (generalized): Secondary | ICD-10-CM | POA: Diagnosis not present

## 2020-11-20 DIAGNOSIS — R278 Other lack of coordination: Secondary | ICD-10-CM | POA: Diagnosis not present

## 2020-11-20 DIAGNOSIS — I69328 Other speech and language deficits following cerebral infarction: Secondary | ICD-10-CM | POA: Diagnosis not present

## 2020-11-20 DIAGNOSIS — R2681 Unsteadiness on feet: Secondary | ICD-10-CM | POA: Diagnosis not present

## 2020-11-20 DIAGNOSIS — M6281 Muscle weakness (generalized): Secondary | ICD-10-CM | POA: Diagnosis not present

## 2020-11-20 DIAGNOSIS — R2689 Other abnormalities of gait and mobility: Secondary | ICD-10-CM | POA: Diagnosis not present

## 2020-11-20 DIAGNOSIS — R41841 Cognitive communication deficit: Secondary | ICD-10-CM | POA: Diagnosis not present

## 2020-11-21 DIAGNOSIS — M6281 Muscle weakness (generalized): Secondary | ICD-10-CM | POA: Diagnosis not present

## 2020-11-21 DIAGNOSIS — R278 Other lack of coordination: Secondary | ICD-10-CM | POA: Diagnosis not present

## 2020-11-21 DIAGNOSIS — R2681 Unsteadiness on feet: Secondary | ICD-10-CM | POA: Diagnosis not present

## 2020-11-21 DIAGNOSIS — R41841 Cognitive communication deficit: Secondary | ICD-10-CM | POA: Diagnosis not present

## 2020-11-21 DIAGNOSIS — I69328 Other speech and language deficits following cerebral infarction: Secondary | ICD-10-CM | POA: Diagnosis not present

## 2020-11-21 DIAGNOSIS — R2689 Other abnormalities of gait and mobility: Secondary | ICD-10-CM | POA: Diagnosis not present

## 2020-11-24 DIAGNOSIS — R41841 Cognitive communication deficit: Secondary | ICD-10-CM | POA: Diagnosis not present

## 2020-11-24 DIAGNOSIS — M6281 Muscle weakness (generalized): Secondary | ICD-10-CM | POA: Diagnosis not present

## 2020-11-24 DIAGNOSIS — R2681 Unsteadiness on feet: Secondary | ICD-10-CM | POA: Diagnosis not present

## 2020-11-24 DIAGNOSIS — Z1159 Encounter for screening for other viral diseases: Secondary | ICD-10-CM | POA: Diagnosis not present

## 2020-11-24 DIAGNOSIS — R2689 Other abnormalities of gait and mobility: Secondary | ICD-10-CM | POA: Diagnosis not present

## 2020-11-24 DIAGNOSIS — Z20828 Contact with and (suspected) exposure to other viral communicable diseases: Secondary | ICD-10-CM | POA: Diagnosis not present

## 2020-11-24 DIAGNOSIS — R278 Other lack of coordination: Secondary | ICD-10-CM | POA: Diagnosis not present

## 2020-11-24 DIAGNOSIS — I69328 Other speech and language deficits following cerebral infarction: Secondary | ICD-10-CM | POA: Diagnosis not present

## 2020-11-25 ENCOUNTER — Encounter: Payer: Medicare Other | Attending: Registered Nurse | Admitting: Registered Nurse

## 2020-11-25 ENCOUNTER — Other Ambulatory Visit: Payer: Self-pay

## 2020-11-25 VITALS — BP 120/77 | HR 69 | Temp 98.4°F | Ht 64.0 in | Wt 119.4 lb

## 2020-11-25 DIAGNOSIS — I639 Cerebral infarction, unspecified: Secondary | ICD-10-CM | POA: Diagnosis not present

## 2020-11-25 DIAGNOSIS — I10 Essential (primary) hypertension: Secondary | ICD-10-CM | POA: Diagnosis not present

## 2020-11-25 DIAGNOSIS — R2681 Unsteadiness on feet: Secondary | ICD-10-CM | POA: Diagnosis not present

## 2020-11-25 DIAGNOSIS — M6281 Muscle weakness (generalized): Secondary | ICD-10-CM | POA: Diagnosis not present

## 2020-11-25 DIAGNOSIS — R2689 Other abnormalities of gait and mobility: Secondary | ICD-10-CM | POA: Diagnosis not present

## 2020-11-25 DIAGNOSIS — R41841 Cognitive communication deficit: Secondary | ICD-10-CM | POA: Diagnosis not present

## 2020-11-25 DIAGNOSIS — R278 Other lack of coordination: Secondary | ICD-10-CM | POA: Diagnosis not present

## 2020-11-25 DIAGNOSIS — I69328 Other speech and language deficits following cerebral infarction: Secondary | ICD-10-CM | POA: Diagnosis not present

## 2020-11-25 NOTE — Progress Notes (Signed)
Subjective:    Patient ID: Pamela Mason, female    DOB: [birth year], 75 y.o.   MRN: [id number]  HPI: Pamela Mason is a 75 y.o. female who is here for Transitional Care Visit for follow up of her Subcortical Infarction, Essential Hypertension  and  History of Dementia.   Pamela Mason was brought to [facility] on 10/23/2020 via EMS with acute left sided weakness and dizziness. Neurology was consulted and Loop Recorder was placed on 10/24/2020.  Neurology recommended ASA and Plavix for 3 weeks followed by Plavix alone.  HPI: From Dr Posey Pronto: On 10/24/2020 HPI: Pamela Mason is a 75 y.o. female with medical history significant for dementia, hypertension, hyperlipidemia, sleep apnea who presented to the ED for evaluation of acute left-sided weakness.  History is limited from patient due to dementia and is otherwise obtained from [place], chart review, and patient's daughter at bedside.  Daughter states that patient had new onset left-sided weakness beginning at 22 AM earlier today (6/16).  Patient was standing up when daughter noticed that she began to sway as if she was about to fall.  Her daughter caught her and afterwards noticed that she had significant left-sided weakness.  Patient did not seem to notice the weakness with apparent left hemineglect per daughter's description.  Symptoms resolved in 5 minutes.   EMS were called and she was brought to the ED for further evaluation.  Initial CT head was performed and showed prior strokes without evident acute infarct.  She was being prepared for discharge to home when she had recurrent left-sided weakness with basically flaccid left lower extremity and discoordination of the left upper extremity prompting the call for code stroke.  CT Head WO Contrast:  IMPRESSION: Atrophy. Prior infarct involving portions of the right posterior frontal and anterior right temporal lobe. Localized calcification of a branch of the middle cerebral artery in  this area is consistent with prior occlusion of this portion of the vessel. Elsewhere there is patchy periventricular small vessel disease. No evident acute infarct. No mass or acute hemorrhage.   Mucosal thickening noted in several ethmoid air cells. Probable cerumen in each external auditory canal.  IMPRESSION: 1. No acute abnormality and no change from earlier today 2. Chronic right MCA infarct. 3. ASPECTS is 10  CT Angio:  IMPRESSION: 1. Negative for intracranial large vessel occlusion 2. No significant carotid or vertebral artery stenosis. 3. 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 Recommend annual imaging followup by CTA or MRA. This recommendation follows 2010 ACCF/AHA/AATS/ACR/ASA/SCA/SCAI/SIR/STS/SVM Guidelines for the Diagnosis and Management of Patients with Thoracic Aortic Disease. Circulation.2010; 121: I696-E952. Aortic aneurysm NOS (ICD10-I71.9) m  MRI: Brain IMPRESSION: Small areas of acute infarct in the right superior insula and corona radiata. Small acute infarct right occipital lobe   Chronic infarct right frontal lobe. Mild chronic microvascular ischemia   5 mm nodule left frontal horn possible  heterotopic gray matter.  Pamela Mason underwent : on 10/24/2020: See Below.       Constance Haw, MD Primary [phone]    Procedure Laterality Anesthesia  LOOP RECORDER INSERTION       Pamela Mason was admitted to inpatient Rehabilitation on 10/26/2020 and discharged home on 11/13/2020. She is receiving Home Health Therapy with Living Will, Pamela Mason lives at [facility]. Pamela Mason denies any pain. She rates her pain 0. Also reports she has a good appetite.   This provider spoke with Pamela Mason daughter  due to her history of Dementia, all questions answered. Also [facility] was called.    Pain Inventory Average Pain 0 Pain Right Now 0 My pain is  no pain  LOCATION OF PAIN  no  pain  BOWEL Number of stools per week: 7 Oral laxative use Yes  Type of laxative colace Enema or suppository use No  History of colostomy No  Incontinent No   BLADDER Pads Bladder incontinence Yes  Frequent urination No  Leakage with coughing No  Difficulty starting stream No  Incomplete bladder emptying No    Mobility use a walker  Function not employed: date last employed not sure I need assistance with the following:  bathing  Neuro/Psych confusion  Prior Studies N/a  Physicians involved in your care N/a   Family History  Problem Relation Age of Onset   Hypertension Mother    Ovarian cancer Mother    Heart disease Father    Colon cancer Neg Hx    Social History   Socioeconomic History   Marital status: Widowed    Spouse name: Not on file   Number of children: Not on file   Years of education: Not on file   Highest education level: Not on file  Occupational History   Not on file  Tobacco Use   Smoking status: Former    Packs/day: 0.25    Years: 4.00    Pack years: 1.00    Types: Cigarettes   Smokeless tobacco: Never  Vaping Use   Vaping Use: Never used  Substance and Sexual Activity   Alcohol use: Yes    Alcohol/week: 7.0 standard drinks    Types: 7 Glasses of wine per week    Comment: wine daily   Drug use: No   Sexual activity: Not Currently    Birth control/protection: Post-menopausal    Comment: 1st intercourse 60 yrs-Fewer than 5 partners  Other Topics Concern   Not on file  Social History Narrative   Not on file   Social Determinants of Health   Financial Resource Strain: Not on file  Food Insecurity: Not on file  Transportation Needs: Not on file  Physical Activity: Not on file  Stress: Not on file  Social Connections: Not on file   Past Surgical History:  Procedure Laterality Date   COLONOSCOPY     INTRACAPSULAR CATARACT EXTRACTION     LOOP RECORDER INSERTION N/A 10/24/2020   Procedure: LOOP RECORDER INSERTION;   Surgeon: Constance Haw, MD;  Location: [facility];  Service: Cardiovascular;  Laterality: N/A;   ROTATOR CUFF REPAIR     TOTAL HIP ARTHROPLASTY Left 01/02/2019   Procedure: TOTAL HIP ARTHROPLASTY ANTERIOR APPROACH;  Surgeon: Paralee Cancel, MD;  Location: [facility];  Service: Orthopedics;  Laterality: Left;  70 mins   TUBAL LIGATION     Past Medical History:  Diagnosis Date   Anxiety    Arthritis    Complication of anesthesia    slow to wake up   Dementia ([place]) 2018   memory loss   Depression    Hyperlipidemia    Hypertension    Osteopenia    Sleep apnea    BP 120/77 (BP Location: Right Arm)   Pulse 69   Temp 98.4 F (36.9 C) (Oral)   Ht 5\' 4"  (1.626 m)   Wt 119 lb 6.4 oz (54.2 kg)   SpO2 94%   BMI 20.49 kg/m   Opioid Risk Score:   Fall Risk Score:  `1  Depression screen PHQ 2/9  No flowsheet data found.   Review of Systems  Constitutional: Negative.   HENT: Negative.    Eyes: Negative.   Respiratory: Negative.    Cardiovascular: Negative.   Gastrointestinal: Negative.   Endocrine: Negative.   Genitourinary: Negative.   Musculoskeletal: Negative.   Skin: Negative.   Allergic/Immunologic: Negative.   Neurological:  Positive for weakness.  Hematological: Negative.   Psychiatric/Behavioral:  Positive for confusion.       Objective:   Physical Exam Vitals and nursing note reviewed.  Constitutional:      Appearance: Normal appearance.  Cardiovascular:     Rate and Rhythm: Normal rate and regular rhythm.     Pulses: Normal pulses.     Heart sounds: Normal heart sounds.  Pulmonary:     Effort: Pulmonary effort is normal.     Breath sounds: Normal breath sounds.  Musculoskeletal:     Cervical back: Normal range of motion and neck supple.     Comments: Normal Muscle Bulk and Muscle Testing Reveals:  Upper Extremities: Full ROM and Muscle Strength 5/5  Lower Extremities: Full ROM and Muscle Strength 5/5 Arises from Table with ease Narrow Based   Gait     Skin:    General: Skin is warm and dry.  Neurological:     Mental Status: She is alert and oriented to person, place, and time.  Psychiatric:        Mood and Affect: Mood normal.        Behavior: Behavior normal.         Assessment & Plan:  1.Subcortical Infarction: Continue current mediation Regimen. She has a scheduled appointment with Neurology. 2. Essential Hypertension: Continue current medication regimen: PCP Following. Continue to monitor.  3. History of Dementia. Neurology Following. Continue to Monitor.   F/U in 4-6 weeks with Dr Letta Pate

## 2020-11-26 DIAGNOSIS — R278 Other lack of coordination: Secondary | ICD-10-CM | POA: Diagnosis not present

## 2020-11-26 DIAGNOSIS — M6281 Muscle weakness (generalized): Secondary | ICD-10-CM | POA: Diagnosis not present

## 2020-11-26 DIAGNOSIS — R2681 Unsteadiness on feet: Secondary | ICD-10-CM | POA: Diagnosis not present

## 2020-11-26 DIAGNOSIS — I69328 Other speech and language deficits following cerebral infarction: Secondary | ICD-10-CM | POA: Diagnosis not present

## 2020-11-26 DIAGNOSIS — R41841 Cognitive communication deficit: Secondary | ICD-10-CM | POA: Diagnosis not present

## 2020-11-26 DIAGNOSIS — R2689 Other abnormalities of gait and mobility: Secondary | ICD-10-CM | POA: Diagnosis not present

## 2020-11-27 DIAGNOSIS — I69328 Other speech and language deficits following cerebral infarction: Secondary | ICD-10-CM | POA: Diagnosis not present

## 2020-11-27 DIAGNOSIS — M6281 Muscle weakness (generalized): Secondary | ICD-10-CM | POA: Diagnosis not present

## 2020-11-27 DIAGNOSIS — Z1159 Encounter for screening for other viral diseases: Secondary | ICD-10-CM | POA: Diagnosis not present

## 2020-11-27 DIAGNOSIS — Z20828 Contact with and (suspected) exposure to other viral communicable diseases: Secondary | ICD-10-CM | POA: Diagnosis not present

## 2020-11-27 DIAGNOSIS — R278 Other lack of coordination: Secondary | ICD-10-CM | POA: Diagnosis not present

## 2020-11-27 DIAGNOSIS — R2689 Other abnormalities of gait and mobility: Secondary | ICD-10-CM | POA: Diagnosis not present

## 2020-11-27 DIAGNOSIS — R41841 Cognitive communication deficit: Secondary | ICD-10-CM | POA: Diagnosis not present

## 2020-11-27 DIAGNOSIS — R2681 Unsteadiness on feet: Secondary | ICD-10-CM | POA: Diagnosis not present

## 2020-11-28 DIAGNOSIS — R41841 Cognitive communication deficit: Secondary | ICD-10-CM | POA: Diagnosis not present

## 2020-11-28 DIAGNOSIS — R278 Other lack of coordination: Secondary | ICD-10-CM | POA: Diagnosis not present

## 2020-11-28 DIAGNOSIS — R2681 Unsteadiness on feet: Secondary | ICD-10-CM | POA: Diagnosis not present

## 2020-11-28 DIAGNOSIS — R2689 Other abnormalities of gait and mobility: Secondary | ICD-10-CM | POA: Diagnosis not present

## 2020-11-28 DIAGNOSIS — I69328 Other speech and language deficits following cerebral infarction: Secondary | ICD-10-CM | POA: Diagnosis not present

## 2020-11-28 DIAGNOSIS — M6281 Muscle weakness (generalized): Secondary | ICD-10-CM | POA: Diagnosis not present

## 2020-12-01 ENCOUNTER — Ambulatory Visit (INDEPENDENT_AMBULATORY_CARE_PROVIDER_SITE_OTHER): Payer: Medicare Other

## 2020-12-01 DIAGNOSIS — R41841 Cognitive communication deficit: Secondary | ICD-10-CM | POA: Diagnosis not present

## 2020-12-01 DIAGNOSIS — R2681 Unsteadiness on feet: Secondary | ICD-10-CM | POA: Diagnosis not present

## 2020-12-01 DIAGNOSIS — I639 Cerebral infarction, unspecified: Secondary | ICD-10-CM | POA: Diagnosis not present

## 2020-12-01 DIAGNOSIS — M6281 Muscle weakness (generalized): Secondary | ICD-10-CM | POA: Diagnosis not present

## 2020-12-01 DIAGNOSIS — Z20828 Contact with and (suspected) exposure to other viral communicable diseases: Secondary | ICD-10-CM | POA: Diagnosis not present

## 2020-12-01 DIAGNOSIS — R2689 Other abnormalities of gait and mobility: Secondary | ICD-10-CM | POA: Diagnosis not present

## 2020-12-01 DIAGNOSIS — I69328 Other speech and language deficits following cerebral infarction: Secondary | ICD-10-CM | POA: Diagnosis not present

## 2020-12-01 DIAGNOSIS — R278 Other lack of coordination: Secondary | ICD-10-CM | POA: Diagnosis not present

## 2020-12-01 DIAGNOSIS — Z1159 Encounter for screening for other viral diseases: Secondary | ICD-10-CM | POA: Diagnosis not present

## 2020-12-02 ENCOUNTER — Encounter: Payer: Self-pay | Admitting: Registered Nurse

## 2020-12-02 DIAGNOSIS — R41841 Cognitive communication deficit: Secondary | ICD-10-CM | POA: Diagnosis not present

## 2020-12-02 DIAGNOSIS — M6281 Muscle weakness (generalized): Secondary | ICD-10-CM | POA: Diagnosis not present

## 2020-12-02 DIAGNOSIS — R278 Other lack of coordination: Secondary | ICD-10-CM | POA: Diagnosis not present

## 2020-12-02 DIAGNOSIS — R2689 Other abnormalities of gait and mobility: Secondary | ICD-10-CM | POA: Diagnosis not present

## 2020-12-02 DIAGNOSIS — R2681 Unsteadiness on feet: Secondary | ICD-10-CM | POA: Diagnosis not present

## 2020-12-02 DIAGNOSIS — I69328 Other speech and language deficits following cerebral infarction: Secondary | ICD-10-CM | POA: Diagnosis not present

## 2020-12-02 LAB — CUP PACEART REMOTE DEVICE CHECK
Date Time Interrogation Session: 20220723111139
Implantable Pulse Generator Implant Date: 20220617

## 2020-12-03 DIAGNOSIS — R278 Other lack of coordination: Secondary | ICD-10-CM | POA: Diagnosis not present

## 2020-12-03 DIAGNOSIS — R2689 Other abnormalities of gait and mobility: Secondary | ICD-10-CM | POA: Diagnosis not present

## 2020-12-03 DIAGNOSIS — R2681 Unsteadiness on feet: Secondary | ICD-10-CM | POA: Diagnosis not present

## 2020-12-03 DIAGNOSIS — M6281 Muscle weakness (generalized): Secondary | ICD-10-CM | POA: Diagnosis not present

## 2020-12-03 DIAGNOSIS — I69328 Other speech and language deficits following cerebral infarction: Secondary | ICD-10-CM | POA: Diagnosis not present

## 2020-12-03 DIAGNOSIS — R41841 Cognitive communication deficit: Secondary | ICD-10-CM | POA: Diagnosis not present

## 2020-12-04 DIAGNOSIS — R2689 Other abnormalities of gait and mobility: Secondary | ICD-10-CM | POA: Diagnosis not present

## 2020-12-04 DIAGNOSIS — R41841 Cognitive communication deficit: Secondary | ICD-10-CM | POA: Diagnosis not present

## 2020-12-04 DIAGNOSIS — R278 Other lack of coordination: Secondary | ICD-10-CM | POA: Diagnosis not present

## 2020-12-04 DIAGNOSIS — R2681 Unsteadiness on feet: Secondary | ICD-10-CM | POA: Diagnosis not present

## 2020-12-04 DIAGNOSIS — M6281 Muscle weakness (generalized): Secondary | ICD-10-CM | POA: Diagnosis not present

## 2020-12-04 DIAGNOSIS — I69328 Other speech and language deficits following cerebral infarction: Secondary | ICD-10-CM | POA: Diagnosis not present

## 2020-12-05 DIAGNOSIS — R41841 Cognitive communication deficit: Secondary | ICD-10-CM | POA: Diagnosis not present

## 2020-12-05 DIAGNOSIS — R278 Other lack of coordination: Secondary | ICD-10-CM | POA: Diagnosis not present

## 2020-12-05 DIAGNOSIS — R2689 Other abnormalities of gait and mobility: Secondary | ICD-10-CM | POA: Diagnosis not present

## 2020-12-05 DIAGNOSIS — M6281 Muscle weakness (generalized): Secondary | ICD-10-CM | POA: Diagnosis not present

## 2020-12-05 DIAGNOSIS — I69328 Other speech and language deficits following cerebral infarction: Secondary | ICD-10-CM | POA: Diagnosis not present

## 2020-12-05 DIAGNOSIS — R2681 Unsteadiness on feet: Secondary | ICD-10-CM | POA: Diagnosis not present

## 2020-12-08 DIAGNOSIS — I69354 Hemiplegia and hemiparesis following cerebral infarction affecting left non-dominant side: Secondary | ICD-10-CM | POA: Diagnosis not present

## 2020-12-08 DIAGNOSIS — Z1159 Encounter for screening for other viral diseases: Secondary | ICD-10-CM | POA: Diagnosis not present

## 2020-12-08 DIAGNOSIS — R278 Other lack of coordination: Secondary | ICD-10-CM | POA: Diagnosis not present

## 2020-12-08 DIAGNOSIS — R2681 Unsteadiness on feet: Secondary | ICD-10-CM | POA: Diagnosis not present

## 2020-12-08 DIAGNOSIS — R41841 Cognitive communication deficit: Secondary | ICD-10-CM | POA: Diagnosis not present

## 2020-12-08 DIAGNOSIS — Z20828 Contact with and (suspected) exposure to other viral communicable diseases: Secondary | ICD-10-CM | POA: Diagnosis not present

## 2020-12-08 DIAGNOSIS — M6281 Muscle weakness (generalized): Secondary | ICD-10-CM | POA: Diagnosis not present

## 2020-12-08 DIAGNOSIS — R2689 Other abnormalities of gait and mobility: Secondary | ICD-10-CM | POA: Diagnosis not present

## 2020-12-08 DIAGNOSIS — I69328 Other speech and language deficits following cerebral infarction: Secondary | ICD-10-CM | POA: Diagnosis not present

## 2020-12-09 DIAGNOSIS — R278 Other lack of coordination: Secondary | ICD-10-CM | POA: Diagnosis not present

## 2020-12-09 DIAGNOSIS — R2681 Unsteadiness on feet: Secondary | ICD-10-CM | POA: Diagnosis not present

## 2020-12-09 DIAGNOSIS — R41841 Cognitive communication deficit: Secondary | ICD-10-CM | POA: Diagnosis not present

## 2020-12-09 DIAGNOSIS — R2689 Other abnormalities of gait and mobility: Secondary | ICD-10-CM | POA: Diagnosis not present

## 2020-12-09 DIAGNOSIS — I69328 Other speech and language deficits following cerebral infarction: Secondary | ICD-10-CM | POA: Diagnosis not present

## 2020-12-09 DIAGNOSIS — M6281 Muscle weakness (generalized): Secondary | ICD-10-CM | POA: Diagnosis not present

## 2020-12-10 DIAGNOSIS — R41841 Cognitive communication deficit: Secondary | ICD-10-CM | POA: Diagnosis not present

## 2020-12-10 DIAGNOSIS — M6281 Muscle weakness (generalized): Secondary | ICD-10-CM | POA: Diagnosis not present

## 2020-12-10 DIAGNOSIS — R278 Other lack of coordination: Secondary | ICD-10-CM | POA: Diagnosis not present

## 2020-12-10 DIAGNOSIS — R2689 Other abnormalities of gait and mobility: Secondary | ICD-10-CM | POA: Diagnosis not present

## 2020-12-10 DIAGNOSIS — I69328 Other speech and language deficits following cerebral infarction: Secondary | ICD-10-CM | POA: Diagnosis not present

## 2020-12-10 DIAGNOSIS — R2681 Unsteadiness on feet: Secondary | ICD-10-CM | POA: Diagnosis not present

## 2020-12-11 DIAGNOSIS — R2689 Other abnormalities of gait and mobility: Secondary | ICD-10-CM | POA: Diagnosis not present

## 2020-12-11 DIAGNOSIS — M6281 Muscle weakness (generalized): Secondary | ICD-10-CM | POA: Diagnosis not present

## 2020-12-11 DIAGNOSIS — R278 Other lack of coordination: Secondary | ICD-10-CM | POA: Diagnosis not present

## 2020-12-11 DIAGNOSIS — R2681 Unsteadiness on feet: Secondary | ICD-10-CM | POA: Diagnosis not present

## 2020-12-11 DIAGNOSIS — R41841 Cognitive communication deficit: Secondary | ICD-10-CM | POA: Diagnosis not present

## 2020-12-11 DIAGNOSIS — I69328 Other speech and language deficits following cerebral infarction: Secondary | ICD-10-CM | POA: Diagnosis not present

## 2020-12-12 ENCOUNTER — Other Ambulatory Visit: Payer: Self-pay | Admitting: Physical Medicine & Rehabilitation

## 2020-12-12 ENCOUNTER — Other Ambulatory Visit: Payer: Self-pay | Admitting: Physical Medicine and Rehabilitation

## 2020-12-12 DIAGNOSIS — R41841 Cognitive communication deficit: Secondary | ICD-10-CM | POA: Diagnosis not present

## 2020-12-12 DIAGNOSIS — R278 Other lack of coordination: Secondary | ICD-10-CM | POA: Diagnosis not present

## 2020-12-12 DIAGNOSIS — R2689 Other abnormalities of gait and mobility: Secondary | ICD-10-CM | POA: Diagnosis not present

## 2020-12-12 DIAGNOSIS — I69328 Other speech and language deficits following cerebral infarction: Secondary | ICD-10-CM | POA: Diagnosis not present

## 2020-12-12 DIAGNOSIS — M6281 Muscle weakness (generalized): Secondary | ICD-10-CM | POA: Diagnosis not present

## 2020-12-12 DIAGNOSIS — R2681 Unsteadiness on feet: Secondary | ICD-10-CM | POA: Diagnosis not present

## 2020-12-15 DIAGNOSIS — R41841 Cognitive communication deficit: Secondary | ICD-10-CM | POA: Diagnosis not present

## 2020-12-15 DIAGNOSIS — I69328 Other speech and language deficits following cerebral infarction: Secondary | ICD-10-CM | POA: Diagnosis not present

## 2020-12-15 DIAGNOSIS — R278 Other lack of coordination: Secondary | ICD-10-CM | POA: Diagnosis not present

## 2020-12-15 DIAGNOSIS — M6281 Muscle weakness (generalized): Secondary | ICD-10-CM | POA: Diagnosis not present

## 2020-12-15 DIAGNOSIS — R2681 Unsteadiness on feet: Secondary | ICD-10-CM | POA: Diagnosis not present

## 2020-12-15 DIAGNOSIS — Z8616 Personal history of COVID-19: Secondary | ICD-10-CM | POA: Diagnosis not present

## 2020-12-15 DIAGNOSIS — R2689 Other abnormalities of gait and mobility: Secondary | ICD-10-CM | POA: Diagnosis not present

## 2020-12-16 DIAGNOSIS — R41841 Cognitive communication deficit: Secondary | ICD-10-CM | POA: Diagnosis not present

## 2020-12-16 DIAGNOSIS — R2689 Other abnormalities of gait and mobility: Secondary | ICD-10-CM | POA: Diagnosis not present

## 2020-12-16 DIAGNOSIS — R278 Other lack of coordination: Secondary | ICD-10-CM | POA: Diagnosis not present

## 2020-12-16 DIAGNOSIS — R2681 Unsteadiness on feet: Secondary | ICD-10-CM | POA: Diagnosis not present

## 2020-12-16 DIAGNOSIS — I69328 Other speech and language deficits following cerebral infarction: Secondary | ICD-10-CM | POA: Diagnosis not present

## 2020-12-16 DIAGNOSIS — M6281 Muscle weakness (generalized): Secondary | ICD-10-CM | POA: Diagnosis not present

## 2020-12-17 DIAGNOSIS — R2681 Unsteadiness on feet: Secondary | ICD-10-CM | POA: Diagnosis not present

## 2020-12-17 DIAGNOSIS — M6281 Muscle weakness (generalized): Secondary | ICD-10-CM | POA: Diagnosis not present

## 2020-12-17 DIAGNOSIS — I69328 Other speech and language deficits following cerebral infarction: Secondary | ICD-10-CM | POA: Diagnosis not present

## 2020-12-17 DIAGNOSIS — R278 Other lack of coordination: Secondary | ICD-10-CM | POA: Diagnosis not present

## 2020-12-17 DIAGNOSIS — R41841 Cognitive communication deficit: Secondary | ICD-10-CM | POA: Diagnosis not present

## 2020-12-17 DIAGNOSIS — R2689 Other abnormalities of gait and mobility: Secondary | ICD-10-CM | POA: Diagnosis not present

## 2020-12-18 DIAGNOSIS — R278 Other lack of coordination: Secondary | ICD-10-CM | POA: Diagnosis not present

## 2020-12-18 DIAGNOSIS — R2689 Other abnormalities of gait and mobility: Secondary | ICD-10-CM | POA: Diagnosis not present

## 2020-12-18 DIAGNOSIS — R2681 Unsteadiness on feet: Secondary | ICD-10-CM | POA: Diagnosis not present

## 2020-12-18 DIAGNOSIS — M6281 Muscle weakness (generalized): Secondary | ICD-10-CM | POA: Diagnosis not present

## 2020-12-18 DIAGNOSIS — I69328 Other speech and language deficits following cerebral infarction: Secondary | ICD-10-CM | POA: Diagnosis not present

## 2020-12-18 DIAGNOSIS — R41841 Cognitive communication deficit: Secondary | ICD-10-CM | POA: Diagnosis not present

## 2020-12-19 DIAGNOSIS — R2689 Other abnormalities of gait and mobility: Secondary | ICD-10-CM | POA: Diagnosis not present

## 2020-12-19 DIAGNOSIS — I69328 Other speech and language deficits following cerebral infarction: Secondary | ICD-10-CM | POA: Diagnosis not present

## 2020-12-19 DIAGNOSIS — R41841 Cognitive communication deficit: Secondary | ICD-10-CM | POA: Diagnosis not present

## 2020-12-19 DIAGNOSIS — R278 Other lack of coordination: Secondary | ICD-10-CM | POA: Diagnosis not present

## 2020-12-19 DIAGNOSIS — R2681 Unsteadiness on feet: Secondary | ICD-10-CM | POA: Diagnosis not present

## 2020-12-19 DIAGNOSIS — M6281 Muscle weakness (generalized): Secondary | ICD-10-CM | POA: Diagnosis not present

## 2020-12-22 DIAGNOSIS — R2681 Unsteadiness on feet: Secondary | ICD-10-CM | POA: Diagnosis not present

## 2020-12-22 DIAGNOSIS — I69328 Other speech and language deficits following cerebral infarction: Secondary | ICD-10-CM | POA: Diagnosis not present

## 2020-12-22 DIAGNOSIS — Z20828 Contact with and (suspected) exposure to other viral communicable diseases: Secondary | ICD-10-CM | POA: Diagnosis not present

## 2020-12-22 DIAGNOSIS — R2689 Other abnormalities of gait and mobility: Secondary | ICD-10-CM | POA: Diagnosis not present

## 2020-12-22 DIAGNOSIS — M6281 Muscle weakness (generalized): Secondary | ICD-10-CM | POA: Diagnosis not present

## 2020-12-22 DIAGNOSIS — R41841 Cognitive communication deficit: Secondary | ICD-10-CM | POA: Diagnosis not present

## 2020-12-22 DIAGNOSIS — R278 Other lack of coordination: Secondary | ICD-10-CM | POA: Diagnosis not present

## 2020-12-23 DIAGNOSIS — I69328 Other speech and language deficits following cerebral infarction: Secondary | ICD-10-CM | POA: Diagnosis not present

## 2020-12-23 DIAGNOSIS — R2689 Other abnormalities of gait and mobility: Secondary | ICD-10-CM | POA: Diagnosis not present

## 2020-12-23 DIAGNOSIS — M6281 Muscle weakness (generalized): Secondary | ICD-10-CM | POA: Diagnosis not present

## 2020-12-23 DIAGNOSIS — R2681 Unsteadiness on feet: Secondary | ICD-10-CM | POA: Diagnosis not present

## 2020-12-23 DIAGNOSIS — R278 Other lack of coordination: Secondary | ICD-10-CM | POA: Diagnosis not present

## 2020-12-23 DIAGNOSIS — R41841 Cognitive communication deficit: Secondary | ICD-10-CM | POA: Diagnosis not present

## 2020-12-24 DIAGNOSIS — R2689 Other abnormalities of gait and mobility: Secondary | ICD-10-CM | POA: Diagnosis not present

## 2020-12-24 DIAGNOSIS — R41841 Cognitive communication deficit: Secondary | ICD-10-CM | POA: Diagnosis not present

## 2020-12-24 DIAGNOSIS — M6281 Muscle weakness (generalized): Secondary | ICD-10-CM | POA: Diagnosis not present

## 2020-12-24 DIAGNOSIS — R2681 Unsteadiness on feet: Secondary | ICD-10-CM | POA: Diagnosis not present

## 2020-12-24 DIAGNOSIS — R278 Other lack of coordination: Secondary | ICD-10-CM | POA: Diagnosis not present

## 2020-12-24 DIAGNOSIS — I69328 Other speech and language deficits following cerebral infarction: Secondary | ICD-10-CM | POA: Diagnosis not present

## 2020-12-25 DIAGNOSIS — I69328 Other speech and language deficits following cerebral infarction: Secondary | ICD-10-CM | POA: Diagnosis not present

## 2020-12-25 DIAGNOSIS — R2681 Unsteadiness on feet: Secondary | ICD-10-CM | POA: Diagnosis not present

## 2020-12-25 DIAGNOSIS — R278 Other lack of coordination: Secondary | ICD-10-CM | POA: Diagnosis not present

## 2020-12-25 DIAGNOSIS — R2689 Other abnormalities of gait and mobility: Secondary | ICD-10-CM | POA: Diagnosis not present

## 2020-12-25 DIAGNOSIS — R41841 Cognitive communication deficit: Secondary | ICD-10-CM | POA: Diagnosis not present

## 2020-12-25 DIAGNOSIS — M6281 Muscle weakness (generalized): Secondary | ICD-10-CM | POA: Diagnosis not present

## 2020-12-26 DIAGNOSIS — R278 Other lack of coordination: Secondary | ICD-10-CM | POA: Diagnosis not present

## 2020-12-26 DIAGNOSIS — M6281 Muscle weakness (generalized): Secondary | ICD-10-CM | POA: Diagnosis not present

## 2020-12-26 DIAGNOSIS — R41841 Cognitive communication deficit: Secondary | ICD-10-CM | POA: Diagnosis not present

## 2020-12-26 DIAGNOSIS — I69328 Other speech and language deficits following cerebral infarction: Secondary | ICD-10-CM | POA: Diagnosis not present

## 2020-12-26 DIAGNOSIS — R2681 Unsteadiness on feet: Secondary | ICD-10-CM | POA: Diagnosis not present

## 2020-12-26 DIAGNOSIS — R2689 Other abnormalities of gait and mobility: Secondary | ICD-10-CM | POA: Diagnosis not present

## 2020-12-26 NOTE — Progress Notes (Signed)
Carelink Summary Report / Loop Recorder 

## 2020-12-29 DIAGNOSIS — I69328 Other speech and language deficits following cerebral infarction: Secondary | ICD-10-CM | POA: Diagnosis not present

## 2020-12-29 DIAGNOSIS — R41841 Cognitive communication deficit: Secondary | ICD-10-CM | POA: Diagnosis not present

## 2020-12-29 DIAGNOSIS — R2689 Other abnormalities of gait and mobility: Secondary | ICD-10-CM | POA: Diagnosis not present

## 2020-12-29 DIAGNOSIS — Z20828 Contact with and (suspected) exposure to other viral communicable diseases: Secondary | ICD-10-CM | POA: Diagnosis not present

## 2020-12-29 DIAGNOSIS — R278 Other lack of coordination: Secondary | ICD-10-CM | POA: Diagnosis not present

## 2020-12-29 DIAGNOSIS — R2681 Unsteadiness on feet: Secondary | ICD-10-CM | POA: Diagnosis not present

## 2020-12-29 DIAGNOSIS — M6281 Muscle weakness (generalized): Secondary | ICD-10-CM | POA: Diagnosis not present

## 2020-12-30 DIAGNOSIS — R278 Other lack of coordination: Secondary | ICD-10-CM | POA: Diagnosis not present

## 2020-12-30 DIAGNOSIS — R2689 Other abnormalities of gait and mobility: Secondary | ICD-10-CM | POA: Diagnosis not present

## 2020-12-30 DIAGNOSIS — R2681 Unsteadiness on feet: Secondary | ICD-10-CM | POA: Diagnosis not present

## 2020-12-30 DIAGNOSIS — M6281 Muscle weakness (generalized): Secondary | ICD-10-CM | POA: Diagnosis not present

## 2020-12-30 DIAGNOSIS — R41841 Cognitive communication deficit: Secondary | ICD-10-CM | POA: Diagnosis not present

## 2020-12-30 DIAGNOSIS — I69328 Other speech and language deficits following cerebral infarction: Secondary | ICD-10-CM | POA: Diagnosis not present

## 2020-12-31 DIAGNOSIS — R2689 Other abnormalities of gait and mobility: Secondary | ICD-10-CM | POA: Diagnosis not present

## 2020-12-31 DIAGNOSIS — R278 Other lack of coordination: Secondary | ICD-10-CM | POA: Diagnosis not present

## 2020-12-31 DIAGNOSIS — R41841 Cognitive communication deficit: Secondary | ICD-10-CM | POA: Diagnosis not present

## 2020-12-31 DIAGNOSIS — R2681 Unsteadiness on feet: Secondary | ICD-10-CM | POA: Diagnosis not present

## 2020-12-31 DIAGNOSIS — M6281 Muscle weakness (generalized): Secondary | ICD-10-CM | POA: Diagnosis not present

## 2020-12-31 DIAGNOSIS — I69328 Other speech and language deficits following cerebral infarction: Secondary | ICD-10-CM | POA: Diagnosis not present

## 2021-01-01 ENCOUNTER — Other Ambulatory Visit: Payer: Self-pay

## 2021-01-01 ENCOUNTER — Ambulatory Visit (INDEPENDENT_AMBULATORY_CARE_PROVIDER_SITE_OTHER): Payer: Medicare Other | Admitting: Adult Health

## 2021-01-01 ENCOUNTER — Encounter: Payer: Self-pay | Admitting: Adult Health

## 2021-01-01 ENCOUNTER — Ambulatory Visit (INDEPENDENT_AMBULATORY_CARE_PROVIDER_SITE_OTHER): Payer: Medicare Other

## 2021-01-01 VITALS — BP 107/77 | HR 74 | Ht 64.0 in | Wt 122.0 lb

## 2021-01-01 DIAGNOSIS — I639 Cerebral infarction, unspecified: Secondary | ICD-10-CM

## 2021-01-01 LAB — CUP PACEART REMOTE DEVICE CHECK
Date Time Interrogation Session: 20220825111235
Implantable Pulse Generator Implant Date: 20220617

## 2021-01-01 MED ORDER — ROSUVASTATIN CALCIUM 20 MG PO TABS
20.0000 mg | ORAL_TABLET | Freq: Every day | ORAL | 3 refills | Status: DC
Start: 2021-01-01 — End: 2022-01-05

## 2021-01-01 NOTE — Patient Instructions (Addendum)
Continue clopidogrel 75 mg daily for secondary stroke prevention  Stop simvastatin and Crestor '20mg'$  daily for further stroke prevention  Continue working with therapies for likely ongoing recovery  Your loop recorder will continue to be monitored for possible atrial fibrillation  Continue to follow up with PCP regarding cholesterol and blood pressure management  Maintain strict control of hypertension with blood pressure goal below 130/90 and cholesterol with LDL cholesterol (bad cholesterol) goal below 70 mg/dL.       Followup in the future with me in 3 months or call earlier if needed       Thank you for coming to see [place] at [facility]. I hope we have been able to provide you high quality care today.  You may receive a patient satisfaction survey over the next few weeks. We would appreciate your feedback and comments so that we may continue to improve ourselves and the health of our patients.

## 2021-01-01 NOTE — Progress Notes (Signed)
[facility] [address] [place]. [place] [address] [phone]       HOSPITAL FOLLOW UP NOTE  Pamela Mason Date of Birth:  12-Nov-1945 Medical Record Number:  [id number]   Reason for Referral:  hospital stroke follow up    SUBJECTIVE:   CHIEF COMPLAINT:  Chief Complaint  Patient presents with   Follow-up    RM 3 with daughter Pamela Mason Pt is well, still having some L hand numbness, working with PT, OT, and ST     HPI:   Pamela Mason is a 75 year old female with history of hypertension, hyperlipidemia, OSA, MCI admitted on 10/23/2020 for left-sided weakness and ataxia.  Patient initially had an episode of left-sided weakness lasting for 5 minutes, came to ER for evaluation.  CT no acute abnormality but chronic right MCA frontal infarct.  Patient was about to be discharged and then had again left-sided weakness with limb ataxia and left neglect.  Code stroke activated, MRI showed right MCA scattered infarct as well as infarcts in right occipital lobe as well as chronic right MCA frontal infarct.  CTA head and neck no LVO.  Patient not a tPA candidate given mild symptoms.  EF 55 to 60%, bilateral LE venous Doppler no DVT.  LDL 91, A1c  5.4. UDS negative. Creatinine 0.89.  Etiology likely embolic of unknown cause.  Loop recorder placed.  Recommended DAPT for 3 weeks and Plavix alone as well as continued simvastatin 40 mg daily.  Therapy eval recommended CIR for residual left hemiparesis and ataxia as well as cognitive impairment. CIR admin 6/19-7/7. D/c'd to [facility]  Today, 01/05/2021, Ms. Pamela Mason is being seen for hospital follow-up acompanied by her daughter.  Overall doing well.  Reports residual incoordination LUE with left hand numbness and gait impairment but overall greatly improving.  Currently receiving PT/OT/SLP at [facility].  Continued use of rolling walker without any recent falls.  Worsening cognition compared to baseline post stroke  but gradually improving.  Denies new stroke/TIA symptoms.  Compliant on Plavix and simvastatin without side effects. Blood pressure today 107/77.  Routinely monitored during therapy sessions but unknown levels.  No further concerns at this time.    Pertinent imaging  CT Head [id number] ([id number]): Atrophy.  Prior infarct involving portions of the right posterior frontal and anterior right temporal lobe.  Localized calcification of a branch of the middle cerebral artery in this area is consistent with prior occlusion of this portion of the vessel.  Elsewhere there is patchy periventricular small vessel disease.  No evident acute infarct. No mass or acute hemorrhage.  ASPECTS 10   CT Angio head/neck [id number] ([id number]) Negative for intracranial large vessel occlusion No significant carotid or vertebral artery stenosis. 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 Recommend annual imaging followup by CTA or MRA.   MR Brain [id number] [phone]): Small areas of acute infarct in the right superior insula and corona radiata. Small acute infarct right occipital lobe Chronic infarct right frontal lobe. Mild chronic microvascular ischemia 5 mm nodule left frontal horn possible  heterotopic gray matter.   MR Brain [phone]: Mild-to-moderate cerebral atrophy. No acute intracranial abnormality.        ROS:   14 system review of systems performed and negative with exception of those listed in HPI  PMH:  Past Medical History:  Diagnosis Date   Anxiety    Arthritis    Complication of anesthesia    slow to wake up  Dementia ([place]) 2018   memory loss   Depression    Hyperlipidemia    Hypertension    Osteopenia    Sleep apnea     PSH:  Past Surgical History:  Procedure Laterality Date   COLONOSCOPY     INTRACAPSULAR CATARACT EXTRACTION     LOOP RECORDER INSERTION N/A 10/24/2020   Procedure: LOOP RECORDER INSERTION;  Surgeon: Constance Haw, MD;  Location: [facility];  Service: Cardiovascular;  Laterality: N/A;   ROTATOR CUFF REPAIR     TOTAL HIP ARTHROPLASTY Left 01/02/2019   Procedure: TOTAL HIP ARTHROPLASTY ANTERIOR APPROACH;  Surgeon: Paralee Cancel, MD;  Location: [facility];  Service: Orthopedics;  Laterality: Left;  70 mins   TUBAL LIGATION      Social History:  Social History   Socioeconomic History   Marital status: Widowed    Spouse name: Not on file   Number of children: Not on file   Years of education: Not on file   Highest education level: Not on file  Occupational History   Not on file  Tobacco Use   Smoking status: Former    Packs/day: 0.25    Years: 4.00    Pack years: 1.00    Types: Cigarettes   Smokeless tobacco: Never  Vaping Use   Vaping Use: Never used  Substance and Sexual Activity   Alcohol use: Yes    Alcohol/week: 7.0 standard drinks    Types: 7 Glasses of wine per week    Comment: wine daily   Drug use: No   Sexual activity: Not Currently    Birth control/protection: Post-menopausal    Comment: 1st intercourse 34 yrs-Fewer than 5 partners  Other Topics Concern   Not on file  Social History Narrative   Not on file   Social Determinants of Health   Financial Resource Strain: Not on file  Food Insecurity: Not on file  Transportation Needs: Not on file  Physical Activity: Not on file  Stress: Not on file  Social Connections: Not on file  Intimate Partner Violence: Not on file    Family History:  Family History  Problem Relation Age of Onset   Hypertension Mother    Ovarian cancer Mother    Heart disease Father    Colon cancer Neg Hx     Medications:   Current Outpatient Medications on File Prior to Visit  Medication Sig Dispense Refill   acetaminophen (TYLENOL) 325 MG tablet Take 1-2 tablets (325-650 mg total) by mouth every 4 (four) hours as needed for mild pain.     clopidogrel (PLAVIX) 75 MG tablet TAKE 1 TABLET(75 MG) BY MOUTH DAILY 30 tablet 0   docusate sodium (COLACE) 100 MG  capsule TAKE ONE CAPSULE BY MOUTH TWICE DAILY 60 capsule 0   galantamine (RAZADYNE ER) 8 MG 24 hr capsule Take 8 mg by mouth daily with lunch.      lisinopril (ZESTRIL) 5 MG tablet TAKE 1 TABLET(5 MG) BY MOUTH DAILY 30 tablet 0   mirtazapine (REMERON) 15 MG tablet Take 15 mg by mouth at bedtime.     Pediatric Multivitamins-Iron (FLINTSTONES PLUS IRON PO) Take 1 tablet by mouth daily with lunch.      simvastatin (ZOCOR) 40 MG tablet Take 40 mg by mouth every evening.     vitamin B-12 (CYANOCOBALAMIN) 500 MCG tablet Take 500 mcg by mouth daily.     Vitamin D, Ergocalciferol, (DRISDOL) 1.25 MG (50000 UNIT) CAPS capsule Take 1 capsule (50,000 Units  total) by mouth every 7 (seven) days. On sundays 5 capsule    No current facility-administered medications on file prior to visit.    Allergies:   Allergies  Allergen Reactions   Latex     Pt stated she had a reaction to latex in the past. H. Erick Blinks RN   Tape Other (See Comments)    redness      OBJECTIVE:  Physical Exam  Vitals:   01/01/21 1020  BP: 107/77  Pulse: 74  Weight: 122 lb (55.3 kg)  Height: '5\' 4"'$  (1.626 m)   Body mass index is 20.94 kg/m. No results found.  General: well developed, well nourished, very pleasant elderly Caucasian female, seated, in no evident distress Head: head normocephalic and atraumatic.   Neck: supple with no carotid or supraclavicular bruits Cardiovascular: regular rate and rhythm, no murmurs Musculoskeletal: no deformity Skin:  no rash/petichiae Vascular:  Normal pulses all extremities   Neurologic Exam Mental Status: Awake and fully alert.  Fluent speech and language.  Oriented to place and time. Recent memory impaired and remote memory intact. Attention span, concentration and fund of knowledge impaired with daughter providing majority of history. Mood and affect appropriate.  Cranial Nerves: Fundoscopic exam reveals sharp disc margins. Pupils equal, briskly reactive to light. Extraocular  movements full without nystagmus. Visual fields full to confrontation. Hearing intact. Facial sensation intact. Face, tongue, palate moves normally and symmetrically.  Motor: Normal bulk and tone. Normal strength in all tested extremity muscles except slight decrease left hip flexor, ADF and grip weakness Sensory.: intact to touch , pinprick , position and vibratory sensation.  Coordination: Rapid alternating movements normal in all extremities except slightly decreased left hand. Finger-to-nose mild LUE ataxia and heel-to-shin performed accurately bilaterally. Gait and Station: Arises from chair without difficulty. Stance is normal. Gait demonstrates normal stride length and balance with use of RW. Tandem walk and heel toe not attempted.  Reflexes: 1+ and symmetric. Toes downgoing.     NIHSS  1 Modified Rankin  2      ASSESSMENT: Pamela Mason is a 75 y.o. year old female with recent right MCA scattered infarcts, embolic secondary to unknown source on 10/23/2020 after presenting with left-sided weakness.  Loop recorder placed.  Vascular risk factors include chronic right MCA frontal infarct on imaging, HTN, HLD and MCI.      PLAN:  Recurrent right MCA strokes:  Residual deficit: Mild left-sided weakness, gait, worsening cognition at baseline.  Continue working with therapies Loop recorder has not shown atrial fibrillation thus far Further information provided regarding [place] trial by [facility] research Continue clopidogrel 75 mg daily  and switch statin to Crestor 20 mg daily for secondary stroke prevention.   Discussed secondary stroke prevention measures and importance of close PCP follow up for aggressive stroke risk factor management. I have gone over the pathophysiology of stroke, warning signs and symptoms, risk factors and their management in some detail with instructions to go to the closest emergency room for symptoms of concern. HTN: BP goal <130/90.  Stable on low side on  lisinopril 5 mg daily per PCP HLD: LDL goal <70. Recent LDL 91 on simvastatin -recommend changing to Crestor 20 mg daily    Follow up in 3 months or call earlier if needed   CC:  [facility] provider: Dr. Leonie Man PCP: Crist Infante, MD    I spent 48 minutes of face-to-face and non-face-to-face time with patient and daughter.  This included previsit chart review including review of  recent hospitalization, lab review, study review, order entry, electronic health record documentation, patient and daughter education regarding recent stroke and possible etiologies, residual deficits and typical recovery time, importance of managing stroke risk factors and answered all other questions to patient and daughters satisfaction  Frann Rider, [facility]  [facility] [address] [place], [place] [address]  Phone [phone] Fax [phone] Note: This document was prepared with digital dictation and possible smart phrase technology. Any transcriptional errors that result from this process are unintentional.

## 2021-01-02 DIAGNOSIS — M6281 Muscle weakness (generalized): Secondary | ICD-10-CM | POA: Diagnosis not present

## 2021-01-02 DIAGNOSIS — R41841 Cognitive communication deficit: Secondary | ICD-10-CM | POA: Diagnosis not present

## 2021-01-02 DIAGNOSIS — I69328 Other speech and language deficits following cerebral infarction: Secondary | ICD-10-CM | POA: Diagnosis not present

## 2021-01-02 DIAGNOSIS — R2689 Other abnormalities of gait and mobility: Secondary | ICD-10-CM | POA: Diagnosis not present

## 2021-01-02 DIAGNOSIS — R2681 Unsteadiness on feet: Secondary | ICD-10-CM | POA: Diagnosis not present

## 2021-01-02 DIAGNOSIS — R278 Other lack of coordination: Secondary | ICD-10-CM | POA: Diagnosis not present

## 2021-01-02 NOTE — Progress Notes (Signed)
I agree with the above plan 

## 2021-01-05 DIAGNOSIS — Z8616 Personal history of COVID-19: Secondary | ICD-10-CM | POA: Diagnosis not present

## 2021-01-05 DIAGNOSIS — R278 Other lack of coordination: Secondary | ICD-10-CM | POA: Diagnosis not present

## 2021-01-05 DIAGNOSIS — R41841 Cognitive communication deficit: Secondary | ICD-10-CM | POA: Diagnosis not present

## 2021-01-05 DIAGNOSIS — M6281 Muscle weakness (generalized): Secondary | ICD-10-CM | POA: Diagnosis not present

## 2021-01-05 DIAGNOSIS — R2681 Unsteadiness on feet: Secondary | ICD-10-CM | POA: Diagnosis not present

## 2021-01-05 DIAGNOSIS — I69328 Other speech and language deficits following cerebral infarction: Secondary | ICD-10-CM | POA: Diagnosis not present

## 2021-01-05 DIAGNOSIS — R2689 Other abnormalities of gait and mobility: Secondary | ICD-10-CM | POA: Diagnosis not present

## 2021-01-06 DIAGNOSIS — M6281 Muscle weakness (generalized): Secondary | ICD-10-CM | POA: Diagnosis not present

## 2021-01-06 DIAGNOSIS — R2689 Other abnormalities of gait and mobility: Secondary | ICD-10-CM | POA: Diagnosis not present

## 2021-01-06 DIAGNOSIS — I69328 Other speech and language deficits following cerebral infarction: Secondary | ICD-10-CM | POA: Diagnosis not present

## 2021-01-06 DIAGNOSIS — R41841 Cognitive communication deficit: Secondary | ICD-10-CM | POA: Diagnosis not present

## 2021-01-06 DIAGNOSIS — R2681 Unsteadiness on feet: Secondary | ICD-10-CM | POA: Diagnosis not present

## 2021-01-06 DIAGNOSIS — R278 Other lack of coordination: Secondary | ICD-10-CM | POA: Diagnosis not present

## 2021-01-07 DIAGNOSIS — R2689 Other abnormalities of gait and mobility: Secondary | ICD-10-CM | POA: Diagnosis not present

## 2021-01-07 DIAGNOSIS — R2681 Unsteadiness on feet: Secondary | ICD-10-CM | POA: Diagnosis not present

## 2021-01-07 DIAGNOSIS — I69328 Other speech and language deficits following cerebral infarction: Secondary | ICD-10-CM | POA: Diagnosis not present

## 2021-01-07 DIAGNOSIS — M6281 Muscle weakness (generalized): Secondary | ICD-10-CM | POA: Diagnosis not present

## 2021-01-07 DIAGNOSIS — R278 Other lack of coordination: Secondary | ICD-10-CM | POA: Diagnosis not present

## 2021-01-07 DIAGNOSIS — R41841 Cognitive communication deficit: Secondary | ICD-10-CM | POA: Diagnosis not present

## 2021-01-08 DIAGNOSIS — I69354 Hemiplegia and hemiparesis following cerebral infarction affecting left non-dominant side: Secondary | ICD-10-CM | POA: Diagnosis not present

## 2021-01-08 DIAGNOSIS — R41841 Cognitive communication deficit: Secondary | ICD-10-CM | POA: Diagnosis not present

## 2021-01-08 DIAGNOSIS — M6281 Muscle weakness (generalized): Secondary | ICD-10-CM | POA: Diagnosis not present

## 2021-01-08 DIAGNOSIS — R2681 Unsteadiness on feet: Secondary | ICD-10-CM | POA: Diagnosis not present

## 2021-01-08 DIAGNOSIS — I69328 Other speech and language deficits following cerebral infarction: Secondary | ICD-10-CM | POA: Diagnosis not present

## 2021-01-08 DIAGNOSIS — R2689 Other abnormalities of gait and mobility: Secondary | ICD-10-CM | POA: Diagnosis not present

## 2021-01-08 DIAGNOSIS — R278 Other lack of coordination: Secondary | ICD-10-CM | POA: Diagnosis not present

## 2021-01-09 DIAGNOSIS — M6281 Muscle weakness (generalized): Secondary | ICD-10-CM | POA: Diagnosis not present

## 2021-01-09 DIAGNOSIS — R41841 Cognitive communication deficit: Secondary | ICD-10-CM | POA: Diagnosis not present

## 2021-01-09 DIAGNOSIS — R278 Other lack of coordination: Secondary | ICD-10-CM | POA: Diagnosis not present

## 2021-01-09 DIAGNOSIS — I69328 Other speech and language deficits following cerebral infarction: Secondary | ICD-10-CM | POA: Diagnosis not present

## 2021-01-09 DIAGNOSIS — R2681 Unsteadiness on feet: Secondary | ICD-10-CM | POA: Diagnosis not present

## 2021-01-09 DIAGNOSIS — R2689 Other abnormalities of gait and mobility: Secondary | ICD-10-CM | POA: Diagnosis not present

## 2021-01-11 ENCOUNTER — Other Ambulatory Visit: Payer: Self-pay | Admitting: Physical Medicine & Rehabilitation

## 2021-01-12 DIAGNOSIS — Z20828 Contact with and (suspected) exposure to other viral communicable diseases: Secondary | ICD-10-CM | POA: Diagnosis not present

## 2021-01-13 DIAGNOSIS — M6281 Muscle weakness (generalized): Secondary | ICD-10-CM | POA: Diagnosis not present

## 2021-01-13 DIAGNOSIS — R2681 Unsteadiness on feet: Secondary | ICD-10-CM | POA: Diagnosis not present

## 2021-01-13 DIAGNOSIS — R2689 Other abnormalities of gait and mobility: Secondary | ICD-10-CM | POA: Diagnosis not present

## 2021-01-13 DIAGNOSIS — R278 Other lack of coordination: Secondary | ICD-10-CM | POA: Diagnosis not present

## 2021-01-13 DIAGNOSIS — R41841 Cognitive communication deficit: Secondary | ICD-10-CM | POA: Diagnosis not present

## 2021-01-13 DIAGNOSIS — I69328 Other speech and language deficits following cerebral infarction: Secondary | ICD-10-CM | POA: Diagnosis not present

## 2021-01-14 ENCOUNTER — Other Ambulatory Visit: Payer: Self-pay | Admitting: Physical Medicine & Rehabilitation

## 2021-01-14 DIAGNOSIS — M6281 Muscle weakness (generalized): Secondary | ICD-10-CM | POA: Diagnosis not present

## 2021-01-14 DIAGNOSIS — R41841 Cognitive communication deficit: Secondary | ICD-10-CM | POA: Diagnosis not present

## 2021-01-14 DIAGNOSIS — R278 Other lack of coordination: Secondary | ICD-10-CM | POA: Diagnosis not present

## 2021-01-14 DIAGNOSIS — I69328 Other speech and language deficits following cerebral infarction: Secondary | ICD-10-CM | POA: Diagnosis not present

## 2021-01-14 DIAGNOSIS — R2689 Other abnormalities of gait and mobility: Secondary | ICD-10-CM | POA: Diagnosis not present

## 2021-01-14 DIAGNOSIS — R2681 Unsteadiness on feet: Secondary | ICD-10-CM | POA: Diagnosis not present

## 2021-01-15 DIAGNOSIS — R278 Other lack of coordination: Secondary | ICD-10-CM | POA: Diagnosis not present

## 2021-01-15 DIAGNOSIS — R41841 Cognitive communication deficit: Secondary | ICD-10-CM | POA: Diagnosis not present

## 2021-01-15 DIAGNOSIS — R2689 Other abnormalities of gait and mobility: Secondary | ICD-10-CM | POA: Diagnosis not present

## 2021-01-15 DIAGNOSIS — R2681 Unsteadiness on feet: Secondary | ICD-10-CM | POA: Diagnosis not present

## 2021-01-15 DIAGNOSIS — I69328 Other speech and language deficits following cerebral infarction: Secondary | ICD-10-CM | POA: Diagnosis not present

## 2021-01-15 DIAGNOSIS — M6281 Muscle weakness (generalized): Secondary | ICD-10-CM | POA: Diagnosis not present

## 2021-01-15 NOTE — Progress Notes (Signed)
Carelink Summary Report / Loop Recorder 

## 2021-01-16 DIAGNOSIS — R2689 Other abnormalities of gait and mobility: Secondary | ICD-10-CM | POA: Diagnosis not present

## 2021-01-16 DIAGNOSIS — R41841 Cognitive communication deficit: Secondary | ICD-10-CM | POA: Diagnosis not present

## 2021-01-16 DIAGNOSIS — R2681 Unsteadiness on feet: Secondary | ICD-10-CM | POA: Diagnosis not present

## 2021-01-16 DIAGNOSIS — R278 Other lack of coordination: Secondary | ICD-10-CM | POA: Diagnosis not present

## 2021-01-16 DIAGNOSIS — M6281 Muscle weakness (generalized): Secondary | ICD-10-CM | POA: Diagnosis not present

## 2021-01-16 DIAGNOSIS — I69328 Other speech and language deficits following cerebral infarction: Secondary | ICD-10-CM | POA: Diagnosis not present

## 2021-01-19 DIAGNOSIS — M6281 Muscle weakness (generalized): Secondary | ICD-10-CM | POA: Diagnosis not present

## 2021-01-19 DIAGNOSIS — R2689 Other abnormalities of gait and mobility: Secondary | ICD-10-CM | POA: Diagnosis not present

## 2021-01-19 DIAGNOSIS — R2681 Unsteadiness on feet: Secondary | ICD-10-CM | POA: Diagnosis not present

## 2021-01-19 DIAGNOSIS — Z20828 Contact with and (suspected) exposure to other viral communicable diseases: Secondary | ICD-10-CM | POA: Diagnosis not present

## 2021-01-19 DIAGNOSIS — I69328 Other speech and language deficits following cerebral infarction: Secondary | ICD-10-CM | POA: Diagnosis not present

## 2021-01-19 DIAGNOSIS — R278 Other lack of coordination: Secondary | ICD-10-CM | POA: Diagnosis not present

## 2021-01-19 DIAGNOSIS — R41841 Cognitive communication deficit: Secondary | ICD-10-CM | POA: Diagnosis not present

## 2021-01-20 DIAGNOSIS — M6281 Muscle weakness (generalized): Secondary | ICD-10-CM | POA: Diagnosis not present

## 2021-01-20 DIAGNOSIS — R2681 Unsteadiness on feet: Secondary | ICD-10-CM | POA: Diagnosis not present

## 2021-01-20 DIAGNOSIS — R278 Other lack of coordination: Secondary | ICD-10-CM | POA: Diagnosis not present

## 2021-01-20 DIAGNOSIS — R41841 Cognitive communication deficit: Secondary | ICD-10-CM | POA: Diagnosis not present

## 2021-01-20 DIAGNOSIS — R2689 Other abnormalities of gait and mobility: Secondary | ICD-10-CM | POA: Diagnosis not present

## 2021-01-20 DIAGNOSIS — I69328 Other speech and language deficits following cerebral infarction: Secondary | ICD-10-CM | POA: Diagnosis not present

## 2021-01-21 DIAGNOSIS — R2681 Unsteadiness on feet: Secondary | ICD-10-CM | POA: Diagnosis not present

## 2021-01-21 DIAGNOSIS — R41841 Cognitive communication deficit: Secondary | ICD-10-CM | POA: Diagnosis not present

## 2021-01-21 DIAGNOSIS — R278 Other lack of coordination: Secondary | ICD-10-CM | POA: Diagnosis not present

## 2021-01-21 DIAGNOSIS — M6281 Muscle weakness (generalized): Secondary | ICD-10-CM | POA: Diagnosis not present

## 2021-01-21 DIAGNOSIS — I69328 Other speech and language deficits following cerebral infarction: Secondary | ICD-10-CM | POA: Diagnosis not present

## 2021-01-21 DIAGNOSIS — R2689 Other abnormalities of gait and mobility: Secondary | ICD-10-CM | POA: Diagnosis not present

## 2021-01-22 DIAGNOSIS — R2689 Other abnormalities of gait and mobility: Secondary | ICD-10-CM | POA: Diagnosis not present

## 2021-01-22 DIAGNOSIS — M6281 Muscle weakness (generalized): Secondary | ICD-10-CM | POA: Diagnosis not present

## 2021-01-22 DIAGNOSIS — R278 Other lack of coordination: Secondary | ICD-10-CM | POA: Diagnosis not present

## 2021-01-22 DIAGNOSIS — R2681 Unsteadiness on feet: Secondary | ICD-10-CM | POA: Diagnosis not present

## 2021-01-22 DIAGNOSIS — I69328 Other speech and language deficits following cerebral infarction: Secondary | ICD-10-CM | POA: Diagnosis not present

## 2021-01-22 DIAGNOSIS — R41841 Cognitive communication deficit: Secondary | ICD-10-CM | POA: Diagnosis not present

## 2021-01-23 ENCOUNTER — Other Ambulatory Visit: Payer: Self-pay

## 2021-01-23 ENCOUNTER — Encounter: Payer: Medicare Other | Attending: Registered Nurse | Admitting: Physical Medicine & Rehabilitation

## 2021-01-23 ENCOUNTER — Encounter: Payer: Self-pay | Admitting: Physical Medicine & Rehabilitation

## 2021-01-23 VITALS — BP 118/79 | HR 75 | Temp 98.4°F | Ht 64.0 in | Wt 124.2 lb

## 2021-01-23 DIAGNOSIS — I639 Cerebral infarction, unspecified: Secondary | ICD-10-CM | POA: Diagnosis not present

## 2021-01-23 DIAGNOSIS — Z23 Encounter for immunization: Secondary | ICD-10-CM | POA: Diagnosis not present

## 2021-01-23 NOTE — Progress Notes (Signed)
Subjective:    Patient ID: Pamela Mason, female    DOB: [birth year], 75 y.o.   MRN: [id number]  75 y.o. female with history of mild CAD, HTN, sleep apnea, dyslipidemia who presented to ED on 10/23/20 with left-sided weakness and dizziness. CT head was negative for acute changes.  As she was preparing to go home she had recurrent left-sided weakness with limb ataxia and left neglect.  CTA was negative for LVO.  MRI brain showed small acute infarct right superior insula and corona radiata as well as small infarcts in right occipital lobe.  Patient and daughter reported history of palpitation and irregular heartbeat and loop recorder was placed on 06/17.  Neurology recommended aspirin and Plavix for 3 weeks followed by Plavix alone. Therapy evaluations done revealing deficits in balance affecting mobility as well as cognitive deficits affecting abilities to complete ADLs.  CIR was recommended due to functional decline. CIR stay [facility] stroke [facility] Admit date: 10/26/2020 Discharge date: 11/13/2020 HPI Lives in [place] apt at [place], still does HHOT and SLP Ambulates with a walker No falls Able to dress and bathe herself. Does not drive, her daughter brought her here today. Pain Inventory Average Pain 4 Pain Right Now 0 My pain is intermittent, aching, and throbbing  LOCATION OF PAIN  hand, fingers, knee  BOWEL Number of stools per week: 7 Oral laxative use No  Type of laxative na Enema or suppository use No  History of colostomy No  Incontinent No   BLADDER Normal and Pads In and out cath, frequency na Able to self cath  na Bladder incontinence No  Frequent urination Yes  Leakage with coughing No  Difficulty starting stream No  Incomplete bladder emptying No    Mobility walk with assistance use a walker ability to climb steps?  yes do you drive?  no  Function not employed: date last employed . retired  Neuro/Psych bladder control  problems numbness  Prior Studies Any changes since last visit?  no  Physicians involved in your care Any changes since last visit?  no   Family History  Problem Relation Age of Onset   Hypertension Mother    Ovarian cancer Mother    Heart disease Father    Colon cancer Neg Hx    Social History   Socioeconomic History   Marital status: Widowed    Spouse name: Not on file   Number of children: Not on file   Years of education: Not on file   Highest education level: Not on file  Occupational History   Not on file  Tobacco Use   Smoking status: Former    Packs/day: 0.25    Years: 4.00    Pack years: 1.00    Types: Cigarettes   Smokeless tobacco: Never  Vaping Use   Vaping Use: Never used  Substance and Sexual Activity   Alcohol use: Yes    Alcohol/week: 7.0 standard drinks    Types: 7 Glasses of wine per week    Comment: wine daily   Drug use: No   Sexual activity: Not Currently    Birth control/protection: Post-menopausal    Comment: 1st intercourse 63 yrs-Fewer than 5 partners  Other Topics Concern   Not on file  Social History Narrative   Not on file   Social Determinants of Health   Financial Resource Strain: Not on file  Food Insecurity: Not on file  Transportation Needs: Not on file  Physical Activity: Not on  file  Stress: Not on file  Social Connections: Not on file   Past Surgical History:  Procedure Laterality Date   COLONOSCOPY     INTRACAPSULAR CATARACT EXTRACTION     LOOP RECORDER INSERTION N/A 10/24/2020   Procedure: LOOP RECORDER INSERTION;  Surgeon: Constance Haw, MD;  Location: [facility];  Service: Cardiovascular;  Laterality: N/A;   ROTATOR CUFF REPAIR     TOTAL HIP ARTHROPLASTY Left 01/02/2019   Procedure: TOTAL HIP ARTHROPLASTY ANTERIOR APPROACH;  Surgeon: Paralee Cancel, MD;  Location: [facility];  Service: Orthopedics;  Laterality: Left;  70 mins   TUBAL LIGATION     Past Medical History:  Diagnosis Date   Anxiety     Arthritis    Complication of anesthesia    slow to wake up   Dementia ([place]) 2018   memory loss   Depression    Hyperlipidemia    Hypertension    Osteopenia    Sleep apnea    BP 118/79   Pulse 75   Temp 98.4 F (36.9 C) (Oral)   Ht '5\' 4"'$  (1.626 m)   Wt 124 lb 3.2 oz (56.3 kg)   SpO2 99%   BMI 21.32 kg/m   Opioid Risk Score:   Fall Risk Score:  `1  Depression screen PHQ 2/9  Depression screen PHQ 2/9 11/25/2020  Decreased Interest 1  Down, Depressed, Hopeless 0  PHQ - 2 Score 1  Altered sleeping 0  Tired, decreased energy 1  Change in appetite 0  Feeling bad or failure about yourself  0  Trouble concentrating 1  Moving slowly or fidgety/restless 0  Suicidal thoughts 0  PHQ-9 Score 3      Review of Systems  Musculoskeletal:  Positive for arthralgias.  All other systems reviewed and are negative.     Objective:   Physical Exam Vitals and nursing note reviewed.  Constitutional:      Appearance: She is obese.  HENT:     Head: Normocephalic and atraumatic.  Eyes:     Extraocular Movements: Extraocular movements intact.     Conjunctiva/sclera: Conjunctivae normal.     Pupils: Pupils are equal, round, and reactive to light.  Neurological:     Mental Status: She is alert and oriented to person, place, and time.     Comments: Motor strength is 5/5 bilateral deltoid, bicep, tricep, grip, hip flexor, knee extensor, ankle dorsiflexion plantar flexor. Negative straight leg raising  Psychiatric:        Mood and Affect: Mood normal.        Behavior: Behavior normal.          Assessment & Plan:  1.  Probable cardioembolic infarcts right corona radiata, right occipital and right insular cortex.  Overall has done very well from a functional standpoint.  We discussed that she should be at her new functional baseline soon.  She can complete home health OT and speech.  Do not anticipate need for outpatient therapy at this time. Follow-up physical medicine  rehab Follow-up with neurology Follow-up with Dr. Joylene Draft from primary care

## 2021-01-23 NOTE — Patient Instructions (Addendum)
Please call if outpt PT requested

## 2021-01-26 DIAGNOSIS — I69328 Other speech and language deficits following cerebral infarction: Secondary | ICD-10-CM | POA: Diagnosis not present

## 2021-01-26 DIAGNOSIS — M6281 Muscle weakness (generalized): Secondary | ICD-10-CM | POA: Diagnosis not present

## 2021-01-26 DIAGNOSIS — Z8616 Personal history of COVID-19: Secondary | ICD-10-CM | POA: Diagnosis not present

## 2021-01-26 DIAGNOSIS — R278 Other lack of coordination: Secondary | ICD-10-CM | POA: Diagnosis not present

## 2021-01-26 DIAGNOSIS — R2689 Other abnormalities of gait and mobility: Secondary | ICD-10-CM | POA: Diagnosis not present

## 2021-01-26 DIAGNOSIS — R41841 Cognitive communication deficit: Secondary | ICD-10-CM | POA: Diagnosis not present

## 2021-01-26 DIAGNOSIS — R2681 Unsteadiness on feet: Secondary | ICD-10-CM | POA: Diagnosis not present

## 2021-01-27 DIAGNOSIS — R41841 Cognitive communication deficit: Secondary | ICD-10-CM | POA: Diagnosis not present

## 2021-01-27 DIAGNOSIS — R278 Other lack of coordination: Secondary | ICD-10-CM | POA: Diagnosis not present

## 2021-01-27 DIAGNOSIS — R2681 Unsteadiness on feet: Secondary | ICD-10-CM | POA: Diagnosis not present

## 2021-01-27 DIAGNOSIS — M6281 Muscle weakness (generalized): Secondary | ICD-10-CM | POA: Diagnosis not present

## 2021-01-27 DIAGNOSIS — R2689 Other abnormalities of gait and mobility: Secondary | ICD-10-CM | POA: Diagnosis not present

## 2021-01-27 DIAGNOSIS — I69328 Other speech and language deficits following cerebral infarction: Secondary | ICD-10-CM | POA: Diagnosis not present

## 2021-01-28 DIAGNOSIS — R41841 Cognitive communication deficit: Secondary | ICD-10-CM | POA: Diagnosis not present

## 2021-01-28 DIAGNOSIS — M6281 Muscle weakness (generalized): Secondary | ICD-10-CM | POA: Diagnosis not present

## 2021-01-28 DIAGNOSIS — R2681 Unsteadiness on feet: Secondary | ICD-10-CM | POA: Diagnosis not present

## 2021-01-28 DIAGNOSIS — R2689 Other abnormalities of gait and mobility: Secondary | ICD-10-CM | POA: Diagnosis not present

## 2021-01-28 DIAGNOSIS — R278 Other lack of coordination: Secondary | ICD-10-CM | POA: Diagnosis not present

## 2021-01-28 DIAGNOSIS — I69328 Other speech and language deficits following cerebral infarction: Secondary | ICD-10-CM | POA: Diagnosis not present

## 2021-01-29 DIAGNOSIS — I69328 Other speech and language deficits following cerebral infarction: Secondary | ICD-10-CM | POA: Diagnosis not present

## 2021-01-29 DIAGNOSIS — R278 Other lack of coordination: Secondary | ICD-10-CM | POA: Diagnosis not present

## 2021-01-29 DIAGNOSIS — R2681 Unsteadiness on feet: Secondary | ICD-10-CM | POA: Diagnosis not present

## 2021-01-29 DIAGNOSIS — R2689 Other abnormalities of gait and mobility: Secondary | ICD-10-CM | POA: Diagnosis not present

## 2021-01-29 DIAGNOSIS — R41841 Cognitive communication deficit: Secondary | ICD-10-CM | POA: Diagnosis not present

## 2021-01-29 DIAGNOSIS — M6281 Muscle weakness (generalized): Secondary | ICD-10-CM | POA: Diagnosis not present

## 2021-02-02 ENCOUNTER — Ambulatory Visit (INDEPENDENT_AMBULATORY_CARE_PROVIDER_SITE_OTHER): Payer: Medicare Other

## 2021-02-02 DIAGNOSIS — I69328 Other speech and language deficits following cerebral infarction: Secondary | ICD-10-CM | POA: Diagnosis not present

## 2021-02-02 DIAGNOSIS — I639 Cerebral infarction, unspecified: Secondary | ICD-10-CM | POA: Diagnosis not present

## 2021-02-02 DIAGNOSIS — Z8616 Personal history of COVID-19: Secondary | ICD-10-CM | POA: Diagnosis not present

## 2021-02-02 DIAGNOSIS — R41841 Cognitive communication deficit: Secondary | ICD-10-CM | POA: Diagnosis not present

## 2021-02-02 DIAGNOSIS — R2689 Other abnormalities of gait and mobility: Secondary | ICD-10-CM | POA: Diagnosis not present

## 2021-02-02 DIAGNOSIS — R278 Other lack of coordination: Secondary | ICD-10-CM | POA: Diagnosis not present

## 2021-02-02 DIAGNOSIS — M6281 Muscle weakness (generalized): Secondary | ICD-10-CM | POA: Diagnosis not present

## 2021-02-02 DIAGNOSIS — R2681 Unsteadiness on feet: Secondary | ICD-10-CM | POA: Diagnosis not present

## 2021-02-04 DIAGNOSIS — R41841 Cognitive communication deficit: Secondary | ICD-10-CM | POA: Diagnosis not present

## 2021-02-04 DIAGNOSIS — R2689 Other abnormalities of gait and mobility: Secondary | ICD-10-CM | POA: Diagnosis not present

## 2021-02-04 DIAGNOSIS — R278 Other lack of coordination: Secondary | ICD-10-CM | POA: Diagnosis not present

## 2021-02-04 DIAGNOSIS — M6281 Muscle weakness (generalized): Secondary | ICD-10-CM | POA: Diagnosis not present

## 2021-02-04 DIAGNOSIS — I69328 Other speech and language deficits following cerebral infarction: Secondary | ICD-10-CM | POA: Diagnosis not present

## 2021-02-04 DIAGNOSIS — R2681 Unsteadiness on feet: Secondary | ICD-10-CM | POA: Diagnosis not present

## 2021-02-04 LAB — CUP PACEART REMOTE DEVICE CHECK
Date Time Interrogation Session: 20220927110931
Implantable Pulse Generator Implant Date: 20220617

## 2021-02-05 DIAGNOSIS — R278 Other lack of coordination: Secondary | ICD-10-CM | POA: Diagnosis not present

## 2021-02-05 DIAGNOSIS — R41841 Cognitive communication deficit: Secondary | ICD-10-CM | POA: Diagnosis not present

## 2021-02-05 DIAGNOSIS — I69328 Other speech and language deficits following cerebral infarction: Secondary | ICD-10-CM | POA: Diagnosis not present

## 2021-02-05 DIAGNOSIS — R2681 Unsteadiness on feet: Secondary | ICD-10-CM | POA: Diagnosis not present

## 2021-02-05 DIAGNOSIS — M6281 Muscle weakness (generalized): Secondary | ICD-10-CM | POA: Diagnosis not present

## 2021-02-05 DIAGNOSIS — R2689 Other abnormalities of gait and mobility: Secondary | ICD-10-CM | POA: Diagnosis not present

## 2021-02-06 DIAGNOSIS — M6281 Muscle weakness (generalized): Secondary | ICD-10-CM | POA: Diagnosis not present

## 2021-02-06 DIAGNOSIS — I69328 Other speech and language deficits following cerebral infarction: Secondary | ICD-10-CM | POA: Diagnosis not present

## 2021-02-06 DIAGNOSIS — R2689 Other abnormalities of gait and mobility: Secondary | ICD-10-CM | POA: Diagnosis not present

## 2021-02-06 DIAGNOSIS — R278 Other lack of coordination: Secondary | ICD-10-CM | POA: Diagnosis not present

## 2021-02-06 DIAGNOSIS — R2681 Unsteadiness on feet: Secondary | ICD-10-CM | POA: Diagnosis not present

## 2021-02-06 DIAGNOSIS — R41841 Cognitive communication deficit: Secondary | ICD-10-CM | POA: Diagnosis not present

## 2021-02-09 DIAGNOSIS — R41841 Cognitive communication deficit: Secondary | ICD-10-CM | POA: Diagnosis not present

## 2021-02-09 DIAGNOSIS — M6281 Muscle weakness (generalized): Secondary | ICD-10-CM | POA: Diagnosis not present

## 2021-02-09 DIAGNOSIS — Z8616 Personal history of COVID-19: Secondary | ICD-10-CM | POA: Diagnosis not present

## 2021-02-09 DIAGNOSIS — I69328 Other speech and language deficits following cerebral infarction: Secondary | ICD-10-CM | POA: Diagnosis not present

## 2021-02-09 DIAGNOSIS — I69354 Hemiplegia and hemiparesis following cerebral infarction affecting left non-dominant side: Secondary | ICD-10-CM | POA: Diagnosis not present

## 2021-02-09 DIAGNOSIS — R278 Other lack of coordination: Secondary | ICD-10-CM | POA: Diagnosis not present

## 2021-02-09 NOTE — Progress Notes (Signed)
Carelink Summary Report / Loop Recorder 

## 2021-02-10 DIAGNOSIS — R278 Other lack of coordination: Secondary | ICD-10-CM | POA: Diagnosis not present

## 2021-02-10 DIAGNOSIS — I69354 Hemiplegia and hemiparesis following cerebral infarction affecting left non-dominant side: Secondary | ICD-10-CM | POA: Diagnosis not present

## 2021-02-10 DIAGNOSIS — R41841 Cognitive communication deficit: Secondary | ICD-10-CM | POA: Diagnosis not present

## 2021-02-10 DIAGNOSIS — M6281 Muscle weakness (generalized): Secondary | ICD-10-CM | POA: Diagnosis not present

## 2021-02-10 DIAGNOSIS — I69328 Other speech and language deficits following cerebral infarction: Secondary | ICD-10-CM | POA: Diagnosis not present

## 2021-02-11 DIAGNOSIS — I69328 Other speech and language deficits following cerebral infarction: Secondary | ICD-10-CM | POA: Diagnosis not present

## 2021-02-11 DIAGNOSIS — M6281 Muscle weakness (generalized): Secondary | ICD-10-CM | POA: Diagnosis not present

## 2021-02-11 DIAGNOSIS — R278 Other lack of coordination: Secondary | ICD-10-CM | POA: Diagnosis not present

## 2021-02-11 DIAGNOSIS — R41841 Cognitive communication deficit: Secondary | ICD-10-CM | POA: Diagnosis not present

## 2021-02-11 DIAGNOSIS — I69354 Hemiplegia and hemiparesis following cerebral infarction affecting left non-dominant side: Secondary | ICD-10-CM | POA: Diagnosis not present

## 2021-02-13 DIAGNOSIS — I69354 Hemiplegia and hemiparesis following cerebral infarction affecting left non-dominant side: Secondary | ICD-10-CM | POA: Diagnosis not present

## 2021-02-13 DIAGNOSIS — R278 Other lack of coordination: Secondary | ICD-10-CM | POA: Diagnosis not present

## 2021-02-13 DIAGNOSIS — I69328 Other speech and language deficits following cerebral infarction: Secondary | ICD-10-CM | POA: Diagnosis not present

## 2021-02-13 DIAGNOSIS — M6281 Muscle weakness (generalized): Secondary | ICD-10-CM | POA: Diagnosis not present

## 2021-02-13 DIAGNOSIS — R41841 Cognitive communication deficit: Secondary | ICD-10-CM | POA: Diagnosis not present

## 2021-02-16 DIAGNOSIS — R41841 Cognitive communication deficit: Secondary | ICD-10-CM | POA: Diagnosis not present

## 2021-02-16 DIAGNOSIS — M6281 Muscle weakness (generalized): Secondary | ICD-10-CM | POA: Diagnosis not present

## 2021-02-16 DIAGNOSIS — I69328 Other speech and language deficits following cerebral infarction: Secondary | ICD-10-CM | POA: Diagnosis not present

## 2021-02-16 DIAGNOSIS — R278 Other lack of coordination: Secondary | ICD-10-CM | POA: Diagnosis not present

## 2021-02-16 DIAGNOSIS — I69354 Hemiplegia and hemiparesis following cerebral infarction affecting left non-dominant side: Secondary | ICD-10-CM | POA: Diagnosis not present

## 2021-02-16 DIAGNOSIS — Z8616 Personal history of COVID-19: Secondary | ICD-10-CM | POA: Diagnosis not present

## 2021-02-17 DIAGNOSIS — M6281 Muscle weakness (generalized): Secondary | ICD-10-CM | POA: Diagnosis not present

## 2021-02-17 DIAGNOSIS — R278 Other lack of coordination: Secondary | ICD-10-CM | POA: Diagnosis not present

## 2021-02-17 DIAGNOSIS — I69354 Hemiplegia and hemiparesis following cerebral infarction affecting left non-dominant side: Secondary | ICD-10-CM | POA: Diagnosis not present

## 2021-02-17 DIAGNOSIS — I69328 Other speech and language deficits following cerebral infarction: Secondary | ICD-10-CM | POA: Diagnosis not present

## 2021-02-17 DIAGNOSIS — R41841 Cognitive communication deficit: Secondary | ICD-10-CM | POA: Diagnosis not present

## 2021-02-19 DIAGNOSIS — I69328 Other speech and language deficits following cerebral infarction: Secondary | ICD-10-CM | POA: Diagnosis not present

## 2021-02-19 DIAGNOSIS — I69354 Hemiplegia and hemiparesis following cerebral infarction affecting left non-dominant side: Secondary | ICD-10-CM | POA: Diagnosis not present

## 2021-02-19 DIAGNOSIS — R41841 Cognitive communication deficit: Secondary | ICD-10-CM | POA: Diagnosis not present

## 2021-02-19 DIAGNOSIS — R278 Other lack of coordination: Secondary | ICD-10-CM | POA: Diagnosis not present

## 2021-02-19 DIAGNOSIS — M6281 Muscle weakness (generalized): Secondary | ICD-10-CM | POA: Diagnosis not present

## 2021-02-20 DIAGNOSIS — R41841 Cognitive communication deficit: Secondary | ICD-10-CM | POA: Diagnosis not present

## 2021-02-20 DIAGNOSIS — I69328 Other speech and language deficits following cerebral infarction: Secondary | ICD-10-CM | POA: Diagnosis not present

## 2021-02-20 DIAGNOSIS — M6281 Muscle weakness (generalized): Secondary | ICD-10-CM | POA: Diagnosis not present

## 2021-02-20 DIAGNOSIS — R278 Other lack of coordination: Secondary | ICD-10-CM | POA: Diagnosis not present

## 2021-02-20 DIAGNOSIS — I69354 Hemiplegia and hemiparesis following cerebral infarction affecting left non-dominant side: Secondary | ICD-10-CM | POA: Diagnosis not present

## 2021-02-23 DIAGNOSIS — R41841 Cognitive communication deficit: Secondary | ICD-10-CM | POA: Diagnosis not present

## 2021-02-23 DIAGNOSIS — Z8616 Personal history of COVID-19: Secondary | ICD-10-CM | POA: Diagnosis not present

## 2021-02-23 DIAGNOSIS — R278 Other lack of coordination: Secondary | ICD-10-CM | POA: Diagnosis not present

## 2021-02-23 DIAGNOSIS — M6281 Muscle weakness (generalized): Secondary | ICD-10-CM | POA: Diagnosis not present

## 2021-02-23 DIAGNOSIS — I69354 Hemiplegia and hemiparesis following cerebral infarction affecting left non-dominant side: Secondary | ICD-10-CM | POA: Diagnosis not present

## 2021-02-23 DIAGNOSIS — I69328 Other speech and language deficits following cerebral infarction: Secondary | ICD-10-CM | POA: Diagnosis not present

## 2021-02-25 DIAGNOSIS — I69354 Hemiplegia and hemiparesis following cerebral infarction affecting left non-dominant side: Secondary | ICD-10-CM | POA: Diagnosis not present

## 2021-02-25 DIAGNOSIS — I69328 Other speech and language deficits following cerebral infarction: Secondary | ICD-10-CM | POA: Diagnosis not present

## 2021-02-25 DIAGNOSIS — M6281 Muscle weakness (generalized): Secondary | ICD-10-CM | POA: Diagnosis not present

## 2021-02-25 DIAGNOSIS — R41841 Cognitive communication deficit: Secondary | ICD-10-CM | POA: Diagnosis not present

## 2021-02-25 DIAGNOSIS — R278 Other lack of coordination: Secondary | ICD-10-CM | POA: Diagnosis not present

## 2021-03-02 DIAGNOSIS — I69354 Hemiplegia and hemiparesis following cerebral infarction affecting left non-dominant side: Secondary | ICD-10-CM | POA: Diagnosis not present

## 2021-03-02 DIAGNOSIS — Z20828 Contact with and (suspected) exposure to other viral communicable diseases: Secondary | ICD-10-CM | POA: Diagnosis not present

## 2021-03-02 DIAGNOSIS — M6281 Muscle weakness (generalized): Secondary | ICD-10-CM | POA: Diagnosis not present

## 2021-03-02 DIAGNOSIS — I69328 Other speech and language deficits following cerebral infarction: Secondary | ICD-10-CM | POA: Diagnosis not present

## 2021-03-02 DIAGNOSIS — R41841 Cognitive communication deficit: Secondary | ICD-10-CM | POA: Diagnosis not present

## 2021-03-02 DIAGNOSIS — R278 Other lack of coordination: Secondary | ICD-10-CM | POA: Diagnosis not present

## 2021-03-05 ENCOUNTER — Ambulatory Visit (INDEPENDENT_AMBULATORY_CARE_PROVIDER_SITE_OTHER): Payer: Medicare Other

## 2021-03-05 DIAGNOSIS — I639 Cerebral infarction, unspecified: Secondary | ICD-10-CM

## 2021-03-05 DIAGNOSIS — M6281 Muscle weakness (generalized): Secondary | ICD-10-CM | POA: Diagnosis not present

## 2021-03-05 DIAGNOSIS — R278 Other lack of coordination: Secondary | ICD-10-CM | POA: Diagnosis not present

## 2021-03-05 DIAGNOSIS — R41841 Cognitive communication deficit: Secondary | ICD-10-CM | POA: Diagnosis not present

## 2021-03-05 DIAGNOSIS — I69354 Hemiplegia and hemiparesis following cerebral infarction affecting left non-dominant side: Secondary | ICD-10-CM | POA: Diagnosis not present

## 2021-03-05 DIAGNOSIS — I69328 Other speech and language deficits following cerebral infarction: Secondary | ICD-10-CM | POA: Diagnosis not present

## 2021-03-09 DIAGNOSIS — Z8616 Personal history of COVID-19: Secondary | ICD-10-CM | POA: Diagnosis not present

## 2021-03-09 LAB — CUP PACEART REMOTE DEVICE CHECK
Date Time Interrogation Session: 20221030111254
Implantable Pulse Generator Implant Date: 20220617

## 2021-03-12 DIAGNOSIS — I69328 Other speech and language deficits following cerebral infarction: Secondary | ICD-10-CM | POA: Diagnosis not present

## 2021-03-12 DIAGNOSIS — R41841 Cognitive communication deficit: Secondary | ICD-10-CM | POA: Diagnosis not present

## 2021-03-13 NOTE — Progress Notes (Signed)
Carelink Summary Report / Loop Recorder 

## 2021-03-16 DIAGNOSIS — Z20828 Contact with and (suspected) exposure to other viral communicable diseases: Secondary | ICD-10-CM | POA: Diagnosis not present

## 2021-03-17 ENCOUNTER — Telehealth: Payer: Self-pay

## 2021-03-17 NOTE — Telephone Encounter (Signed)
LINQ alert received.  1 event for likely AF, duration 63min, mean HR 98 Burden 0.1%, no OAC, ILR indication cryptogenic CVA Route to triage LR  Transmission reviewed. There are clearly some notable P waves and regular intervals. Routing to provider for closer review.

## 2021-03-23 DIAGNOSIS — Z20822 Contact with and (suspected) exposure to covid-19: Secondary | ICD-10-CM | POA: Diagnosis not present

## 2021-03-30 DIAGNOSIS — Z20828 Contact with and (suspected) exposure to other viral communicable diseases: Secondary | ICD-10-CM | POA: Diagnosis not present

## 2021-04-06 ENCOUNTER — Ambulatory Visit (INDEPENDENT_AMBULATORY_CARE_PROVIDER_SITE_OTHER): Payer: Medicare Other

## 2021-04-06 DIAGNOSIS — Z1159 Encounter for screening for other viral diseases: Secondary | ICD-10-CM | POA: Diagnosis not present

## 2021-04-06 DIAGNOSIS — I639 Cerebral infarction, unspecified: Secondary | ICD-10-CM | POA: Diagnosis not present

## 2021-04-06 DIAGNOSIS — Z20828 Contact with and (suspected) exposure to other viral communicable diseases: Secondary | ICD-10-CM | POA: Diagnosis not present

## 2021-04-08 DIAGNOSIS — E785 Hyperlipidemia, unspecified: Secondary | ICD-10-CM | POA: Diagnosis not present

## 2021-04-08 DIAGNOSIS — I10 Essential (primary) hypertension: Secondary | ICD-10-CM | POA: Diagnosis not present

## 2021-04-08 DIAGNOSIS — F329 Major depressive disorder, single episode, unspecified: Secondary | ICD-10-CM | POA: Diagnosis not present

## 2021-04-08 DIAGNOSIS — E538 Deficiency of other specified B group vitamins: Secondary | ICD-10-CM | POA: Diagnosis not present

## 2021-04-08 DIAGNOSIS — R413 Other amnesia: Secondary | ICD-10-CM | POA: Diagnosis not present

## 2021-04-08 DIAGNOSIS — R7301 Impaired fasting glucose: Secondary | ICD-10-CM | POA: Diagnosis not present

## 2021-04-08 DIAGNOSIS — I639 Cerebral infarction, unspecified: Secondary | ICD-10-CM | POA: Diagnosis not present

## 2021-04-09 ENCOUNTER — Encounter: Payer: Self-pay | Admitting: Adult Health

## 2021-04-09 ENCOUNTER — Ambulatory Visit (INDEPENDENT_AMBULATORY_CARE_PROVIDER_SITE_OTHER): Payer: Medicare Other | Admitting: Adult Health

## 2021-04-09 VITALS — BP 116/71 | HR 61 | Ht 64.0 in | Wt 125.0 lb

## 2021-04-09 DIAGNOSIS — I10 Essential (primary) hypertension: Secondary | ICD-10-CM

## 2021-04-09 DIAGNOSIS — G319 Degenerative disease of nervous system, unspecified: Secondary | ICD-10-CM | POA: Diagnosis not present

## 2021-04-09 DIAGNOSIS — I639 Cerebral infarction, unspecified: Secondary | ICD-10-CM | POA: Diagnosis not present

## 2021-04-09 DIAGNOSIS — E785 Hyperlipidemia, unspecified: Secondary | ICD-10-CM | POA: Diagnosis not present

## 2021-04-09 NOTE — Patient Instructions (Addendum)
Continue clopidogrel 75 mg daily  and Crestor 20mg  daily  for secondary stroke prevention  We will recheck cholesterol levels today - if your LDL or bad cholesterol remains above goal of 70, we may need to increase your current Crestor dosage but we will keep you updated  Continue to follow up with PCP regarding cholesterol and blood pressure management  Maintain strict control of hypertension with blood pressure goal below 130/90 and cholesterol with LDL cholesterol (bad cholesterol) goal below 70 mg/dL.   Continue to do exercises routinely at home! Try to increase memory exercises at home such as cross word puzzles, word search, jigsaw puzzles and reading.  Overall, uou are doing great!       Followup in the future with me in 6 months or call earlier if needed       Thank you for coming to see [place] at [facility]. I hope we have been able to provide you high quality care today.  You may receive a patient satisfaction survey over the next few weeks. We would appreciate your feedback and comments so that we may continue to improve ourselves and the health of our patients.

## 2021-04-09 NOTE — Progress Notes (Signed)
[facility] [address] [place]. [place] [id number] [phone]       STROKE FOLLOW UP NOTE  Pamela Mason Date of Birth:  [birth year] Medical Record Number:  [id number]   Reason for Referral: stroke follow up    SUBJECTIVE:   CHIEF COMPLAINT:  Chief Complaint  Patient presents with   Recurrent strokes    Rm 2, 3 month FU, son- Pamela Mason  "memory has gotten a little worse, PCP started her on Namenda"     HPI:   Update 04/09/2021 Pamela Mason: Returns for 13-month stroke follow-up accompanied by her son, Event organiser.  Continues to reside at [place].  Stable from stroke standpoint -no new stroke/TIA symptoms.  Mild left hand numbness and gait impairment with some improvement since prior visit.  Continued use of rolling walker -denies any recent falls.  Continued cognitive decline since stroke compared to baseline but no worsening since prior visit. Able to maintain ADLs independently but does need assistance for majority of IADLs. Per son, poor short-term memory and difficulty operating appliances (such as microwave but is able to operate washer and dryer).  Gets assistance with medications.  Does not drive. PCP Dr. Joylene Draft started her on Namenda yesterday.  She has since completed therapies.  Participates in exercise class 5x weekly.  Compliant on Plavix and simvastatin -denies side effects.  Blood pressure today 116/71.  No further concerns at this time   History provided for reference purposes only Initial visit 01/05/2021 Pamela Mason: Ms. Brougher is being seen for hospital follow-up acompanied by her daughter.  Overall doing well.  Reports residual incoordination LUE with left hand numbness and gait impairment but overall greatly improving.  Currently receiving PT/OT/SLP at [facility].  Continued use of rolling walker without any recent falls.  Worsening cognition compared to baseline post stroke but gradually improving.  Denies new stroke/TIA symptoms.  Compliant on  Plavix and simvastatin without side effects. Blood pressure today 107/77.  Routinely monitored during therapy sessions but unknown levels.  No further concerns at this time.  Stroke admission 10/23/2020 Pamela Mason is a 75 year old female with history of hypertension, hyperlipidemia, OSA, MCI admitted on 10/23/2020 for left-sided weakness and ataxia.  Patient initially had an episode of left-sided weakness lasting for 5 minutes, came to ER for evaluation.  CT no acute abnormality but chronic right MCA frontal infarct.  Patient was about to be discharged and then had again left-sided weakness with limb ataxia and left neglect.  Code stroke activated, MRI showed right MCA scattered infarct as well as infarcts in right occipital lobe as well as chronic right MCA frontal infarct.  CTA head and neck no LVO.  Patient not a tPA candidate given mild symptoms.  EF 55 to 60%, bilateral LE venous Doppler no DVT.  LDL 91, A1c  5.4. UDS negative. Creatinine 0.89.  Etiology likely embolic of unknown cause.  Loop recorder placed.  Recommended DAPT for 3 weeks and Plavix alone as well as continued simvastatin 40 mg daily.  Therapy eval recommended CIR for residual left hemiparesis and ataxia as well as cognitive impairment. CIR admin 6/19-7/7. D/c'd to [facility]   Pertinent imaging  CT Head [phone] (1332): Atrophy.  Prior infarct involving portions of the right posterior frontal and anterior right temporal lobe.  Localized calcification of a branch of the middle cerebral artery in this area is consistent with prior occlusion of this portion of the vessel.  Elsewhere there is patchy periventricular small vessel disease.  No evident acute infarct. No mass or acute hemorrhage.  ASPECTS 10   CT Angio head/neck [id number] (1720) Negative for intracranial large vessel occlusion No significant carotid or vertebral artery stenosis. 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 Recommend  annual imaging followup by CTA or MRA.   MR Brain [id number] [phone]): Small areas of acute infarct in the right superior insula and corona radiata. Small acute infarct right occipital lobe Chronic infarct right frontal lobe. Mild chronic microvascular ischemia 5 mm nodule left frontal horn possible  heterotopic gray matter.   MR Brain [phone]: Mild-to-moderate cerebral atrophy. No acute intracranial abnormality.        ROS:   14 system review of systems performed and negative with exception of those listed in HPI  PMH:  Past Medical History:  Diagnosis Date   Anxiety    Arthritis    Complication of anesthesia    slow to wake up   CVA (cerebral vascular accident) ([place])    Dementia ([place]) 2018   memory loss   Depression    Hyperlipidemia    Hypertension    Osteopenia    Sleep apnea     PSH:  Past Surgical History:  Procedure Laterality Date   COLONOSCOPY     INTRACAPSULAR CATARACT EXTRACTION     LOOP RECORDER INSERTION N/A 10/24/2020   Procedure: LOOP RECORDER INSERTION;  Surgeon: Constance Haw, MD;  Location: [facility];  Service: Cardiovascular;  Laterality: N/A;   ROTATOR CUFF REPAIR     TOTAL HIP ARTHROPLASTY Left 01/02/2019   Procedure: TOTAL HIP ARTHROPLASTY ANTERIOR APPROACH;  Surgeon: Paralee Cancel, MD;  Location: [facility];  Service: Orthopedics;  Laterality: Left;  70 mins   TUBAL LIGATION      Social History:  Social History   Socioeconomic History   Marital status: Widowed    Spouse name: Not on file   Number of children: 2   Years of education: Not on file   Highest education level: Not on file  Occupational History   Not on file  Tobacco Use   Smoking status: Former    Packs/day: 0.25    Years: 4.00    Pack years: 1.00    Types: Cigarettes   Smokeless tobacco: Never  Vaping Use   Vaping Use: Never used  Substance and Sexual Activity   Alcohol use: Not Currently    Comment: rarely   Drug use: No   Sexual activity: Not  Currently    Birth control/protection: Post-menopausal    Comment: 1st intercourse 88 yrs-Fewer than 5 partners  Other Topics Concern   Not on file  Social History Narrative   04/09/21 lives at [place] Determinants of Health   Financial Resource Strain: Not on file  Food Insecurity: Not on file  Transportation Needs: Not on file  Physical Activity: Not on file  Stress: Not on file  Social Connections: Not on file  Intimate Partner Violence: Not on file    Family History:  Family History  Problem Relation Age of Onset   Hypertension Mother    Ovarian cancer Mother    Heart disease Father    Colon cancer Neg Hx     Medications:   Current Outpatient Medications on File Prior to Visit  Medication Sig Dispense Refill   acetaminophen (TYLENOL) 325 MG tablet Take 1-2 tablets (325-650 mg total) by mouth every 4 (four) hours as needed for mild pain.  clopidogrel (PLAVIX) 75 MG tablet TAKE 1 TABLET(75 MG) BY MOUTH DAILY 30 tablet 0   galantamine (RAZADYNE ER) 16 MG 24 hr capsule 16 mg daily.     lisinopril (ZESTRIL) 10 MG tablet 10 mg daily.     memantine (NAMENDA TITRATION PACK) tablet pack Take by mouth See admin instructions. 34m- 10 mg- 20 mg     mirtazapine (REMERON) 15 MG tablet Take 15 mg by mouth at bedtime.     Pediatric Multivitamins-Iron (FLINTSTONES PLUS IRON PO) Take 1 tablet by mouth daily with lunch.      rosuvastatin (CRESTOR) 20 MG tablet Take 1 tablet (20 mg total) by mouth daily. 90 tablet 3   vitamin B-12 (CYANOCOBALAMIN) 500 MCG tablet Take 500 mcg by mouth daily.     Vitamin D, Ergocalciferol, (DRISDOL) 1.25 MG (50000 UNIT) CAPS capsule Take 1 capsule (50,000 Units total) by mouth every 7 (seven) days. On sundays 5 capsule    No current facility-administered medications on file prior to visit.    Allergies:   Allergies  Allergen Reactions   Latex     Pt stated she had a reaction to latex in the past. H. Erick Blinks RN   Tape Other (See Comments)     redness      OBJECTIVE:  Physical Exam  Vitals:   04/09/21 1101  BP: 116/71  Pulse: 61  Weight: 125 lb (56.7 kg)  Height: 5\' 4"  (1.626 m)    Body mass index is 21.46 kg/m. No results found.  General: well developed, well nourished, very pleasant elderly Caucasian female, seated, in no evident distress Head: head normocephalic and atraumatic.   Neck: supple with no carotid or supraclavicular bruits Cardiovascular: regular rate and rhythm, no murmurs Musculoskeletal: no deformity Skin:  no rash/petichiae Vascular:  Normal pulses all extremities   Neurologic Exam Mental Status: Awake and fully alert.  Fluent speech and language. Recent memory impaired and remote memory intact. Attention span, concentration and fund of knowledge impaired with son providing majority of history. Mood and affect appropriate.  MMSE - Mini Mental State Exam 04/09/2021 05/23/2019 04/20/2018  Not completed: - (No Data) -  Orientation to time 3 5 5   Orientation to Place 5 5 5   Registration 3 3 3   Attention/ Calculation 5 5 5   Recall 2 1 3   Language- name 2 objects 2 2 2   Language- repeat 1 1 1   Language- follow 3 step command 3 3 3   Language- read & follow direction 1 1 1   Write a sentence 1 1 1   Copy design 1 1 1   Total score 27 28 30    Cranial Nerves: Pupils equal, briskly reactive to light. Extraocular movements full without nystagmus. Visual fields full to confrontation. Hearing intact. Facial sensation intact. Face, tongue, palate moves normally and symmetrically.  Motor: Normal bulk and tone. Normal strength in all tested extremity muscles except slight left sided weakness ADF and grip weakness Sensory.: intact to touch , pinprick , position and vibratory sensation except slight decreased sensation left pinky, ring and middle finger compared to the right hand Coordination: Rapid alternating movements normal in all extremities except slightly decreased left hand. Finger-to-nose mild LUE  ataxia and heel-to-shin performed accurately bilaterally. Gait and Station: Arises from chair without difficulty. Stance is normal. Gait demonstrates normal stride length and balance with use of RW. Tandem walk and heel toe not attempted.  Reflexes: 1+ and symmetric. Toes downgoing.       ASSESSMENT: Pamela Mason is a  75 y.o. year old female with recent right MCA scattered infarcts, embolic secondary to unknown source on 10/23/2020 after presenting with left-sided weakness.  Loop recorder placed.  Vascular risk factors include chronic right MCA frontal infarct on imaging, HTN, HLD and MCI.      PLAN:  Recurrent right MCA strokes:  Residual deficit: Mild left-sided weakness, left hand numbness with sensory impairment, gait impairment, and worsening cognition compared to baseline.  Some improvement noted since prior visit.  Encourage continued participation in exercise classes 5x weekly.  Continued use of RW at all times.  Encouraged to increase memory exercises at home. MMSE today 27/30.  Recently started on Namenda per PCP Loop recorder : monitor reported 1 event for likely AF - reviewed by Dr. Curt Bears - unsure if AF per phone note 11/8 -recommended "continued monitoring as episodes are short-lived". No additional events since that time.  Routinely monitored by cardiology Continue clopidogrel 75 mg daily  and Crestor 20 mg daily for secondary stroke prevention.   Discussed secondary stroke prevention measures and importance of close PCP follow up for aggressive stroke risk factor management. I have gone over the pathophysiology of stroke, warning signs and symptoms, risk factors and their management in some detail with instructions to go to the closest emergency room for symptoms of concern. HTN: BP goal <130/90.  Stable on lisinopril 5 mg daily per PCP HLD: LDL goal <70.  Prior LDL 91 (10/2020) switched from simvastatin to Crestor 3 months ago.  Repeat lipid panel today     Follow up in  6 months or call earlier if needed   CC:  PCP: Crist Infante, MD    I spent 36 minutes of face-to-face and non-face-to-face time with patient and son.  This included previsit chart review, lab review, study review, order entry, electronic health record documentation, patient and son education regarding prior stroke, residual deficits, completion and review of MMSE, importance of managing stroke risk factors and secondary stroke prevention measures and answered all other questions to patient and sons satisfaction  Frann Rider, AGNP-BC  [facility] [address] [place], [place] [address]  Phone [phone] Fax [phone] Note: This document was prepared with digital dictation and possible smart phrase technology. Any transcriptional errors that result from this process are unintentional.

## 2021-04-10 LAB — CUP PACEART REMOTE DEVICE CHECK
Date Time Interrogation Session: 20221202111116
Implantable Pulse Generator Implant Date: 20220617

## 2021-04-10 LAB — LIPID PANEL
Chol/HDL Ratio: 2.2 ratio (ref 0.0–4.4)
Cholesterol, Total: 158 mg/dL (ref 100–199)
HDL: 73 mg/dL (ref >39)
LDL Chol Calc (NIH): 65 mg/dL (ref 0–99)
Triglycerides: 113 mg/dL (ref 0–149)
VLDL Cholesterol Cal: 20 mg/dL (ref 5–40)

## 2021-04-13 ENCOUNTER — Telehealth: Payer: Self-pay | Admitting: *Deleted

## 2021-04-13 DIAGNOSIS — Z1159 Encounter for screening for other viral diseases: Secondary | ICD-10-CM | POA: Diagnosis not present

## 2021-04-13 DIAGNOSIS — Z20828 Contact with and (suspected) exposure to other viral communicable diseases: Secondary | ICD-10-CM | POA: Diagnosis not present

## 2021-04-13 NOTE — Telephone Encounter (Signed)
Called son [name] on [place] and informed him that patient's recent cholesterol levels showed excellent improvement on Crestor with current LDL (or bad cholesterol) at 65 with a goal of less than 70.  Please continue current dose of Crestor at this time. He verbalized understanding, appreciation.

## 2021-04-15 NOTE — Progress Notes (Signed)
Carelink Summary Report / Loop Recorder 

## 2021-04-20 DIAGNOSIS — Z1159 Encounter for screening for other viral diseases: Secondary | ICD-10-CM | POA: Diagnosis not present

## 2021-04-20 DIAGNOSIS — Z20828 Contact with and (suspected) exposure to other viral communicable diseases: Secondary | ICD-10-CM | POA: Diagnosis not present

## 2021-04-22 DIAGNOSIS — Z20828 Contact with and (suspected) exposure to other viral communicable diseases: Secondary | ICD-10-CM | POA: Diagnosis not present

## 2021-04-22 DIAGNOSIS — Z1159 Encounter for screening for other viral diseases: Secondary | ICD-10-CM | POA: Diagnosis not present

## 2021-04-24 DIAGNOSIS — Z20828 Contact with and (suspected) exposure to other viral communicable diseases: Secondary | ICD-10-CM | POA: Diagnosis not present

## 2021-04-24 DIAGNOSIS — Z1159 Encounter for screening for other viral diseases: Secondary | ICD-10-CM | POA: Diagnosis not present

## 2021-04-27 DIAGNOSIS — Z1159 Encounter for screening for other viral diseases: Secondary | ICD-10-CM | POA: Diagnosis not present

## 2021-04-27 DIAGNOSIS — Z20828 Contact with and (suspected) exposure to other viral communicable diseases: Secondary | ICD-10-CM | POA: Diagnosis not present

## 2021-04-29 DIAGNOSIS — Z1159 Encounter for screening for other viral diseases: Secondary | ICD-10-CM | POA: Diagnosis not present

## 2021-04-29 DIAGNOSIS — Z20828 Contact with and (suspected) exposure to other viral communicable diseases: Secondary | ICD-10-CM | POA: Diagnosis not present

## 2021-05-06 DIAGNOSIS — Z1159 Encounter for screening for other viral diseases: Secondary | ICD-10-CM | POA: Diagnosis not present

## 2021-05-06 DIAGNOSIS — Z20828 Contact with and (suspected) exposure to other viral communicable diseases: Secondary | ICD-10-CM | POA: Diagnosis not present

## 2021-05-07 ENCOUNTER — Ambulatory Visit (INDEPENDENT_AMBULATORY_CARE_PROVIDER_SITE_OTHER): Payer: Medicare Other

## 2021-05-07 DIAGNOSIS — I639 Cerebral infarction, unspecified: Secondary | ICD-10-CM

## 2021-05-11 DIAGNOSIS — Z20828 Contact with and (suspected) exposure to other viral communicable diseases: Secondary | ICD-10-CM | POA: Diagnosis not present

## 2021-05-13 DIAGNOSIS — Z1159 Encounter for screening for other viral diseases: Secondary | ICD-10-CM | POA: Diagnosis not present

## 2021-05-13 DIAGNOSIS — Z20828 Contact with and (suspected) exposure to other viral communicable diseases: Secondary | ICD-10-CM | POA: Diagnosis not present

## 2021-05-13 LAB — CUP PACEART REMOTE DEVICE CHECK
Date Time Interrogation Session: 20230104111312
Implantable Pulse Generator Implant Date: 20220617

## 2021-05-18 DIAGNOSIS — Z20828 Contact with and (suspected) exposure to other viral communicable diseases: Secondary | ICD-10-CM | POA: Diagnosis not present

## 2021-05-20 DIAGNOSIS — Z20828 Contact with and (suspected) exposure to other viral communicable diseases: Secondary | ICD-10-CM | POA: Diagnosis not present

## 2021-05-20 DIAGNOSIS — Z1159 Encounter for screening for other viral diseases: Secondary | ICD-10-CM | POA: Diagnosis not present

## 2021-05-20 NOTE — Progress Notes (Signed)
Carelink Summary Report / Loop Recorder 

## 2021-05-25 DIAGNOSIS — Z20822 Contact with and (suspected) exposure to covid-19: Secondary | ICD-10-CM | POA: Diagnosis not present

## 2021-06-01 DIAGNOSIS — Z20822 Contact with and (suspected) exposure to covid-19: Secondary | ICD-10-CM | POA: Diagnosis not present

## 2021-06-08 DIAGNOSIS — Z20822 Contact with and (suspected) exposure to covid-19: Secondary | ICD-10-CM | POA: Diagnosis not present

## 2021-08-10 ENCOUNTER — Ambulatory Visit (INDEPENDENT_AMBULATORY_CARE_PROVIDER_SITE_OTHER): Payer: Medicare Other

## 2021-08-10 DIAGNOSIS — I639 Cerebral infarction, unspecified: Secondary | ICD-10-CM | POA: Diagnosis not present

## 2021-08-11 LAB — CUP PACEART REMOTE DEVICE CHECK
Date Time Interrogation Session: 20230404122714
Implantable Pulse Generator Implant Date: 20220617

## 2021-08-24 NOTE — Progress Notes (Signed)
Carelink Summary Report / Loop Recorder 

## 2021-09-07 DIAGNOSIS — Z20822 Contact with and (suspected) exposure to covid-19: Secondary | ICD-10-CM | POA: Diagnosis not present

## 2021-09-10 ENCOUNTER — Ambulatory Visit (INDEPENDENT_AMBULATORY_CARE_PROVIDER_SITE_OTHER): Payer: Medicare Other

## 2021-09-10 DIAGNOSIS — I639 Cerebral infarction, unspecified: Secondary | ICD-10-CM | POA: Diagnosis not present

## 2021-09-10 LAB — CUP PACEART REMOTE DEVICE CHECK
Date Time Interrogation Session: 20230503230801
Implantable Pulse Generator Implant Date: 20220617

## 2021-09-23 NOTE — Progress Notes (Signed)
Carelink Summary Report / Loop Recorder 

## 2021-10-08 ENCOUNTER — Encounter: Payer: Self-pay | Admitting: Adult Health

## 2021-10-08 ENCOUNTER — Ambulatory Visit (INDEPENDENT_AMBULATORY_CARE_PROVIDER_SITE_OTHER): Payer: Medicare Other | Admitting: Adult Health

## 2021-10-08 VITALS — BP 124/74 | HR 84 | Ht 64.0 in | Wt 129.0 lb

## 2021-10-08 DIAGNOSIS — I639 Cerebral infarction, unspecified: Secondary | ICD-10-CM

## 2021-10-08 DIAGNOSIS — G319 Degenerative disease of nervous system, unspecified: Secondary | ICD-10-CM | POA: Diagnosis not present

## 2021-10-08 NOTE — Progress Notes (Signed)
[facility] [address] [place]. [place] [address] [phone]       STROKE FOLLOW UP NOTE  [name] Date of Birth:  12/15/1945 Medical Record Number:  [id number]   Reason for Referral: stroke follow up    SUBJECTIVE:   CHIEF COMPLAINT:  Chief Complaint  Patient presents with   Follow-up    Rm  3 with daughter [name] Pt is well and stable, no new concerns      HPI:   Pamela Mason is a 76 y.o. female with history of cryptogenic right MCA scattered infarcts in 10/2020 s/p ILR with residual mild left hand numbness, gait impairment and cognitive impairment and history of prior right MCA frontal infarct on imaging.   Update 10/08/2021 JM: Patient returns for 69-monthstroke follow-up accompanied by her daughter.  Overall stable without new stroke/TIA symptoms.  Residual deficits as noted above stable. Continues to use RW, did have a fall more recently when trying to get up fairly in the morning without using her walker, she is unable to say the exact date fall occurred, does have bruising on left arm and hip, did not hit head, thankfully has not had any additional falls. Denies any worsening of cognition since prior visit. Does continue to have short term memory issues but per daughter, feels like her mood and functioning has significantly improved since prior visit.  She remains on Namenda per PCP.  [facility] [place] [facility] [place] to maintain [place]s independently, family does assist with some IADLs such as fills pill boxes and then AAfl[employer] Incorporatedadministers medications.   Compliant on Plavix and Crestor, denies side effects.  Blood pressure today 124/74.  Routinely follows with PCP Dr. PJoylene Draftwith follow-up visit this month.  Loop recorder has not yet shown any true episodes of A-fib, did show short-lived episode of either ectopy vs AF but as short-lived, Dr. CCurt Bearsrecommended continued monitoring.  No new concerns at this  time.     History provided for reference purposes only Update 04/09/2021 JM: Returns for 387-monthtroke follow-up accompanied by her son, ScNicki [place]aper [facility] from stroke standpoint -no new stroke/TIA symptoms.  Mild left hand numbness and gait impairment with some improvement since prior visit.  Continued use of rolling walker -denies any recent falls.  Continued cognitive decline since stroke compared to baseline but no worsening since prior visit. Able to maintain ADLs independently but does need assistance for majority of IADLs. Per son, poor short-term memory and difficulty operating appliances (such as microwave but is able to operate washer and dryer).  Gets assistance with medications.  Does not drive. PCP Dr. PeJoylene Drafttarted her on Namenda yesterday.  She has since completed therapies.  Participates in exercise class 5x weekly.  Compliant on Plavix and simvastatin -denies side effects.  Blood pressure today 116/71.  No further concerns at this time  Initial visit 01/05/2021 JM: Pamela Mason being seen for hospital follow-up acompanied by her daughter.  Overall doing well.  Reports residual incoordination LUE with left hand numbness and gait impairment but overall greatly improving.  Currently receiving PT/OT/SLP at [facility].  Continued use of rolling walker without any recent falls.  Worsening cognition compared to baseline post stroke but gradually improving.  Denies new stroke/TIA symptoms.  Compliant on Plavix and simvastatin without side effects. Blood pressure today 107/77.  Routinely monitored during therapy sessions but unknown levels.  No further  concerns at this time.  Stroke admission 10/23/2020 Pamela Mason is a 76 year old female with history of hypertension, hyperlipidemia, OSA, MCI admitted on 10/23/2020 for left-sided weakness and ataxia.  Patient initially had an episode of left-sided weakness lasting for 5 minutes, came to ER for  evaluation.  CT no acute abnormality but chronic right MCA frontal infarct.  Patient was about to be discharged and then had again left-sided weakness with limb ataxia and left neglect.  Code stroke activated, MRI showed right MCA scattered infarct as well as infarcts in right occipital lobe as well as chronic right MCA frontal infarct.  CTA head and neck no LVO.  Patient not a tPA candidate given mild symptoms.  EF 55 to 60%, bilateral LE venous Doppler no DVT.  LDL 91, A1c  5.4. UDS negative. Creatinine 0.89.  Etiology likely embolic of unknown cause.  Loop recorder placed.  Recommended DAPT for 3 weeks and Plavix alone as well as continued simvastatin 40 mg daily.  Therapy eval recommended CIR for residual left hemiparesis and ataxia as well as cognitive impairment. CIR admin 6/19-7/7. D/c'd to [facility]   Pertinent imaging  CT Head [phone] (1332): Atrophy.  Prior infarct involving portions of the right posterior frontal and anterior right temporal lobe.  Localized calcification of a branch of the middle cerebral artery in this area is consistent with prior occlusion of this portion of the vessel.  Elsewhere there is patchy periventricular small vessel disease.  No evident acute infarct. No mass or acute hemorrhage.  ASPECTS 10   CT Angio head/neck [id number] (1720) Negative for intracranial large vessel occlusion No significant carotid or vertebral artery stenosis. 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 Recommend annual imaging followup by CTA or MRA.   MR Brain [id number] [phone]): Small areas of acute infarct in the right superior insula and corona radiata. Small acute infarct right occipital lobe Chronic infarct right frontal lobe. Mild chronic microvascular ischemia 5 mm nodule left frontal horn possible  heterotopic gray matter.   MR Brain [phone]: Mild-to-moderate cerebral atrophy. No acute intracranial abnormality.        ROS:   14  system review of systems performed and negative with exception of those listed in HPI  PMH:  Past Medical History:  Diagnosis Date   Anxiety    Arthritis    Complication of anesthesia    slow to wake up   CVA (cerebral vascular accident) ([place])    Dementia ([place]) 2018   memory loss   Depression    Hyperlipidemia    Hypertension    Osteopenia    Sleep apnea     PSH:  Past Surgical History:  Procedure Laterality Date   COLONOSCOPY     INTRACAPSULAR CATARACT EXTRACTION     LOOP RECORDER INSERTION N/A 10/24/2020   Procedure: LOOP RECORDER INSERTION;  Surgeon: Constance Haw, MD;  Location: [facility];  Service: Cardiovascular;  Laterality: N/A;   ROTATOR CUFF REPAIR     TOTAL HIP ARTHROPLASTY Left 01/02/2019   Procedure: TOTAL HIP ARTHROPLASTY ANTERIOR APPROACH;  Surgeon: Paralee Cancel, MD;  Location: [facility];  Service: Orthopedics;  Laterality: Left;  70 mins   TUBAL LIGATION      Social History:  Social History   Socioeconomic History   Marital status: Widowed    Spouse name: Not on file   Number of children: 2   Years of education: Not on file   Highest education level: Bachelor's  degree (e.g., BA, AB, BS)  Occupational History   Not on file  Tobacco Use   Smoking status: Former    Packs/day: 0.25    Years: 4.00    Pack years: 1.00    Types: Cigarettes   Smokeless tobacco: Never  Vaping Use   Vaping Use: Never used  Substance and Sexual Activity   Alcohol use: Not Currently    Comment: rarely   Drug use: No   Sexual activity: Not Currently    Birth control/protection: Post-menopausal    Comment: 1st intercourse 28 yrs-Fewer than 5 partners  Other Topics Concern   Not on file  Social History Narrative   04/09/21 lives at [facility] Determinants of Health   Financial Resource Strain: Not on file  Food Insecurity: Not on file  Transportation Needs: Not on file  Physical Activity: Not on file  Stress: Not on file  Social Connections:  Not on file  Intimate Partner Violence: Not on file    Family History:  Family History  Problem Relation Age of Onset   Hypertension Mother    Ovarian cancer Mother    Heart disease Father    Colon cancer Neg Hx     Medications:   Current Outpatient Medications on File Prior to Visit  Medication Sig Dispense Refill   acetaminophen (TYLENOL) 325 MG tablet Take 1-2 tablets (325-650 mg total) by mouth every 4 (four) hours as needed for mild pain.     clopidogrel (PLAVIX) 75 MG tablet TAKE 1 TABLET(75 MG) BY MOUTH DAILY 30 tablet 0   galantamine (RAZADYNE ER) 16 MG 24 hr capsule 16 mg daily.     lisinopril (ZESTRIL) 10 MG tablet 10 mg daily.     memantine (NAMENDA TITRATION PACK) tablet pack Take by mouth See admin instructions. 77m 10 mg- 20 mg     mirtazapine (REMERON) 15 MG tablet Take 15 mg by mouth at bedtime.     Pediatric Multivitamins-Iron (FLINTSTONES PLUS IRON PO) Take 1 tablet by mouth daily with lunch.      rosuvastatin (CRESTOR) 20 MG tablet Take 1 tablet (20 mg total) by mouth daily. 90 tablet 3   vitamin B-12 (CYANOCOBALAMIN) 500 MCG tablet Take 500 mcg by mouth daily.     Vitamin D, Ergocalciferol, (DRISDOL) 1.25 MG (50000 UNIT) CAPS capsule Take 1 capsule (50,000 Units total) by mouth every 7 (seven) days. On sundays 5 capsule    No current facility-administered medications on file prior to visit.    Allergies:   Allergies  Allergen Reactions   Latex     Pt stated she had a reaction to latex in the past. H. MErick BlinksRN   Tape Other (See Comments)    redness      OBJECTIVE:  Physical Exam  Vitals:   10/08/21 1341  BP: 124/74  Pulse: 84  Weight: 129 lb (58.5 kg)  Height: '5\' 4"'$  (1.626 m)   Body mass index is 22.14 kg/m. No results found.   General: well developed, well nourished, very pleasant elderly Caucasian female, seated, in no evident distress Head: head normocephalic and atraumatic.   Neck: supple with no carotid or supraclavicular  bruits Cardiovascular: regular rate and rhythm, no murmurs Musculoskeletal: no deformity Skin:  no rash/petichiae Vascular:  Normal pulses all extremities   Neurologic Exam Mental Status: Awake and fully alert.  Fluent speech and language. Recent memory impaired and remote memory intact. Attention span, concentration and fund of knowledge mildly impaired. Mood and  affect appropriate.     10/08/2021    2:11 PM 04/09/2021   11:35 AM 05/23/2019    1:02 PM  MMSE - Mini Mental State Exam  Orientation to time '4 3 5  '$ Orientation to Place '4 5 5  '$ Registration '3 3 3  '$ Attention/ Calculation '5 5 5  '$ Recall 0 2 1  Language- name 2 objects '2 2 2  '$ Language- repeat '1 1 1  '$ Language- follow 3 step command '3 3 3  '$ Language- read & follow direction '1 1 1  '$ Write a sentence '1 1 1  '$ Copy design '1 1 1  '$ Total score '25 27 28   '$ Cranial Nerves: Pupils equal, briskly reactive to light. Extraocular movements full without nystagmus. Visual fields full to confrontation. Hearing intact. Facial sensation intact. Face, tongue, palate moves normally and symmetrically.  Motor: Normal bulk and tone. Normal strength in all tested extremity muscles except slight left sided weakness ADF and grip weakness Sensory.: intact to touch , pinprick , position and vibratory sensation except slight decreased sensation left pinky, ring and middle finger compared to the right hand Coordination: Rapid alternating movements normal in all extremities. Finger-to-nose and heel-to-shin performed accurately bilaterally. Gait and Station: Arises from chair without difficulty. Stance is normal. Gait demonstrates normal stride length and balance with use of RW. Tandem walk and heel toe not attempted.  Reflexes: 1+ and symmetric. Toes downgoing.       ASSESSMENT: Pamela Mason is a 76 y.o. year old female with recent right MCA scattered infarcts, embolic secondary to unknown source on 10/23/2020 after presenting with left-sided weakness.  Loop  recorder placed.  Vascular risk factors include chronic right MCA frontal infarct on imaging, HTN, HLD and MCI.      PLAN:  Recurrent right MCA strokes:  Residual deficit: left hand numbness with sensory impairment, gait impairment, and mild cognitive impairment with short-term memory loss. Continued use of RW at all times for fall prevention.  Encouraged to increase memory exercises at home. MMSE today 25/30 (prior 27/30).  On Namenda managed by Dr. Joylene Draft - attempting to contact pharmacy to verify dosage of Namenda as daughter is unsure - if not at therapeutic dose, may consider increasing Loop recorder has not shown significant evidence of A-fib thus far.  Has had a couple "short lived" episodes of either ectopy vs AF but per Dr. Curt Bears as episode short-lived, recommended continued monitoring Continue clopidogrel 75 mg daily  and Crestor 20 mg daily for secondary stroke prevention.   Discussed secondary stroke prevention measures and importance of close PCP follow up for aggressive stroke risk factor management including BP goal<130/90, HLD with LDL goal<70 and DM with A1c.<7 .  Stroke labs: LDL 65 (04/2021), A1c 5.4 (10/2020)    Follow up in 1 year or call earlier if needed   CC:  PCP: Crist Infante, MD    I spent 38 minutes of face-to-face and non-face-to-face time with patient and daughter.  This included previsit chart review, lab review, study review, electronic health record documentation, patient and daughter education regarding prior stroke, residual deficits, completion and review of MMSE, importance of managing stroke risk factors and secondary stroke prevention measures and answered all other questions to patient and daughters satisfaction  Frann Rider, [facility]  [facility] [address] [place], [place] [address]  Phone [phone] Fax [phone] Note: This document was prepared with digital dictation and possible smart phrase  technology. Any transcriptional errors that result from this process are  unintentional.

## 2021-10-08 NOTE — Addendum Note (Signed)
Addended by: Verlin Grills on: 10/08/2021 02:37 PM   Modules accepted: Orders

## 2021-10-08 NOTE — Patient Instructions (Addendum)
Continue clopidogrel 75 mg daily  and Crestor for secondary stroke prevention  Cardiology will continue to monitor your loop recorder  Continue to follow up with PCP regarding cholesterol and blood pressure management  Maintain strict control of hypertension with blood pressure goal below 130/90 and cholesterol with LDL cholesterol (bad cholesterol) goal below 70 mg/dL.   Signs of a Stroke? Follow the BEFAST method:  Balance Watch for a sudden loss of balance, trouble with coordination or vertigo Eyes Is there a sudden loss of vision in one or both eyes? Or double vision?  Face: Ask the person to smile. Does one side of the face droop or is it numb?  Arms: Ask the person to raise both arms. Does one arm drift downward? Is there weakness or numbness of a leg? Speech: Ask the person to repeat a simple phrase. Does the speech sound slurred/strange? Is the person confused ? Time: If you observe any of these signs, call 911.     Follow up in 1 year or call earlier if needed     Thank you for coming to see [place] at [facility]. I hope we have been able to provide you high quality care today.  You may receive a patient satisfaction survey over the next few weeks. We would appreciate your feedback and comments so that we may continue to improve ourselves and the health of our patients.      Mild Neurocognitive Disorder Mild neurocognitive disorder, formerly known as mild cognitive impairment, is a disorder in which memory does not work as well as it should. This disorder may also cause problems with other mental functions, including thought, communication, behavior, and completion of tasks. These problems can be noticed and measured, but they usually do not interfere with daily activities or the ability to live independently. Mild neurocognitive disorder typically develops after 77 years of age, but it can also develop at younger ages. It is not as serious as major  neurocognitive disorder, also known as dementia, but it may be the first sign of it. Generally, symptoms of this condition get worse over time. In rare cases, symptoms can get better. What are the causes? This condition may be caused by: Brain disorders like Alzheimer's disease, Parkinson's disease, and other conditions that gradually damage nerve cells (neurodegenerative conditions). Diseases that affect blood vessels in the brain and result in small strokes. Certain infections, such as HIV. Traumatic brain injury. Other medical conditions, such as brain tumors, underactive thyroid (hypothyroidism), and vitamin B12 deficiency. Use of certain drugs or prescription medicines. What increases the risk? The following factors may make you more likely to develop this condition: Being older than 65 years. Being female. Low education level. Diabetes, high blood pressure, high cholesterol, and other conditions that increase the risk for blood vessel diseases. Untreated or undertreated sleep apnea. Having a certain type of gene that can be passed from parent to child (inherited). Chronic health problems such as heart disease, lung disease, liver disease, kidney disease, or depression. What are the signs or symptoms? Symptoms of this condition include: Difficulty remembering. You may: Forget names, phone numbers, or details of recent events. Forget social events and appointments. Repeatedly forget where you put your car keys or other items. Difficulty thinking and solving problems. You may have trouble with complex tasks, such as: Paying bills. Driving in unfamiliar places. Difficulty communicating. You may have trouble: Finding the right word or naming an object. Forming a sentence that makes sense, or understanding what  you read or hear. Changes in your behavior or personality. When this happens, you may: Lose interest in the things that you used to enjoy. Withdraw from social situations. Get  angry more easily than usual. Act before thinking. How is this diagnosed? This condition is diagnosed based on: Your symptoms. Your health care provider may ask you and the people you spend time with, such as family and friends, about your symptoms. Evaluation of mental functions (neuropsychological testing). Your health care provider may refer you to a neurologist or mental health specialist to evaluate your mental functions in detail. To identify the cause of your condition, your health care provider may: Get a detailed medical history. Ask about use of alcohol, drugs, and prescription medicines. Do a physical exam. Order blood tests and brain imaging exams. How is this treated? Mild neurocognitive disorder that is caused by medicine use, drug use, infection, or another medical condition may improve when the cause is treated, or when medicines or drugs are stopped. If this disorder has another cause, it generally does not improve and may get worse. In these cases, the goal of treatment is to help you manage the loss of mental function. Treatments in these cases include: Medicine. Medicine mainly helps memory and behavior symptoms. Talk therapy. Talk therapy provides education, emotional support, memory aids, and other ways of making up for problems with mental function. Lifestyle changes, including: Getting regular exercise. Eating a healthy diet that includes omega-3 fatty acids. Challenging your thinking and memory skills. Having more social interaction. Follow these instructions at home: Eating and drinking  Drink enough fluid to keep your urine pale yellow. Eat a healthy diet that includes omega-3 fatty acids. These can be found in: Fish. Nuts. Leafy vegetables. Vegetable oils. If you drink alcohol: Limit how much you use to: 0-1 drink a day for women. 0-2 drinks a day for men. Be aware of how much alcohol is in your drink. In the [place] one drink equals one 12 oz bottle of beer  (355 mL), one 5 oz glass of wine (148 mL), or one 1 oz glass of hard liquor (44 mL). Lifestyle  Get regular exercise as told by your health care provider. Do not use any products that contain nicotine or tobacco, such as cigarettes, e-cigarettes, and chewing tobacco. If you need help quitting, ask your health care provider. Practice ways to manage stress. If you need help managing stress, ask your health care provider. Continue to have social interaction. Keep your mind active with stimulating activities you enjoy, such as reading or playing games. Make sure to get quality sleep. Follow these tips: Avoid napping during the day. Keep your sleeping area dark and cool. Avoid exercising during the few hours before you go to bed. Avoid caffeine products in the evening. General instructions Take over-the-counter and prescription medicines only as told by your health care provider. Your health care provider may recommend that you avoid taking medicines that can affect thinking, such as pain medicines or sleep medicines. Work with your health care provider to find out what you need help with and what your safety needs are. Keep all follow-up visits. This is important. Where to find more information [employer] on Aging: http://kim-miller.com/ Contact a health care provider if: You have any new symptoms. Get help right away if: You develop new confusion or your confusion gets worse. You act in ways that place you or your family in danger. Summary Mild neurocognitive disorder is a disorder in which memory does  not work as well as it should. Mild neurocognitive disorder can have many causes. It may be the first stage of dementia. To manage your condition, get regular exercise, keep your mind active, get quality sleep, and eat a healthy diet. This information is not intended to replace advice given to you by your health care provider. Make sure you discuss any questions you have with your health  care provider. Document Revised: 09/10/2019 Document Reviewed: 09/10/2019 Elsevier Patient Education  [place] [employer] Compensation Strategies  Use "WARM" strategy.  W= write it down  A= associate it  R= repeat it  M= make a mental note  2.   You can keep a Social worker.  Use a 3-ring notebook with sections for the following: calendar, important names and phone numbers,  medications, doctors' names/phone numbers, lists/reminders, and a section to journal what you did  each day.   3.    Use a calendar to write appointments down.  4.    Write yourself a schedule for the day.  This can be placed on the calendar or in a separate section of the Memory Notebook.  Keeping a  regular schedule can help memory.  5.    Use medication organizer with sections for each day or morning/evening pills.  You may need help loading it  6.    Keep a basket, or pegboard by the door.  Place items that you need to take out with you in the basket or on the pegboard.  You may also want to  include a message board for reminders.  7.    Use sticky notes.  Place sticky notes with reminders in a place where the task is performed.  For example: " turn off the  stove" placed by the stove, "lock the door" placed on the door at eye level, " take your medications" on  the bathroom mirror or by the place where you normally take your medications.  8.    Use alarms/timers.  Use while cooking to remind yourself to check on food or as a reminder to take your medicine, or as a  reminder to make a call, or as a reminder to perform another task, etc.

## 2021-10-12 ENCOUNTER — Ambulatory Visit (INDEPENDENT_AMBULATORY_CARE_PROVIDER_SITE_OTHER): Payer: Medicare Other

## 2021-10-12 DIAGNOSIS — I639 Cerebral infarction, unspecified: Secondary | ICD-10-CM

## 2021-10-12 LAB — CUP PACEART REMOTE DEVICE CHECK
Date Time Interrogation Session: 20230605043953
Implantable Pulse Generator Implant Date: 20220617

## 2021-10-21 DIAGNOSIS — R7301 Impaired fasting glucose: Secondary | ICD-10-CM | POA: Diagnosis not present

## 2021-10-21 DIAGNOSIS — E559 Vitamin D deficiency, unspecified: Secondary | ICD-10-CM | POA: Diagnosis not present

## 2021-10-21 DIAGNOSIS — Z0000 Encounter for general adult medical examination without abnormal findings: Secondary | ICD-10-CM | POA: Diagnosis not present

## 2021-10-21 DIAGNOSIS — E538 Deficiency of other specified B group vitamins: Secondary | ICD-10-CM | POA: Diagnosis not present

## 2021-10-21 DIAGNOSIS — R7989 Other specified abnormal findings of blood chemistry: Secondary | ICD-10-CM | POA: Diagnosis not present

## 2021-10-21 DIAGNOSIS — I10 Essential (primary) hypertension: Secondary | ICD-10-CM | POA: Diagnosis not present

## 2021-10-21 DIAGNOSIS — E785 Hyperlipidemia, unspecified: Secondary | ICD-10-CM | POA: Diagnosis not present

## 2021-10-28 DIAGNOSIS — Z0000 Encounter for general adult medical examination without abnormal findings: Secondary | ICD-10-CM | POA: Diagnosis not present

## 2021-10-28 DIAGNOSIS — R197 Diarrhea, unspecified: Secondary | ICD-10-CM | POA: Diagnosis not present

## 2021-10-28 DIAGNOSIS — E785 Hyperlipidemia, unspecified: Secondary | ICD-10-CM | POA: Diagnosis not present

## 2021-10-28 DIAGNOSIS — I10 Essential (primary) hypertension: Secondary | ICD-10-CM | POA: Diagnosis not present

## 2021-10-28 DIAGNOSIS — Z85820 Personal history of malignant melanoma of skin: Secondary | ICD-10-CM | POA: Diagnosis not present

## 2021-10-28 DIAGNOSIS — R413 Other amnesia: Secondary | ICD-10-CM | POA: Diagnosis not present

## 2021-10-28 DIAGNOSIS — Z23 Encounter for immunization: Secondary | ICD-10-CM | POA: Diagnosis not present

## 2021-10-28 DIAGNOSIS — Z1339 Encounter for screening examination for other mental health and behavioral disorders: Secondary | ICD-10-CM | POA: Diagnosis not present

## 2021-10-28 DIAGNOSIS — N390 Urinary tract infection, site not specified: Secondary | ICD-10-CM | POA: Diagnosis not present

## 2021-10-28 DIAGNOSIS — F329 Major depressive disorder, single episode, unspecified: Secondary | ICD-10-CM | POA: Diagnosis not present

## 2021-10-28 DIAGNOSIS — Z1331 Encounter for screening for depression: Secondary | ICD-10-CM | POA: Diagnosis not present

## 2021-10-28 DIAGNOSIS — M8580 Other specified disorders of bone density and structure, unspecified site: Secondary | ICD-10-CM | POA: Diagnosis not present

## 2021-10-29 NOTE — Progress Notes (Signed)
Carelink Summary Report / Loop Recorder 

## 2021-11-06 DIAGNOSIS — I10 Essential (primary) hypertension: Secondary | ICD-10-CM | POA: Diagnosis not present

## 2021-11-06 DIAGNOSIS — E785 Hyperlipidemia, unspecified: Secondary | ICD-10-CM | POA: Diagnosis not present

## 2021-11-12 ENCOUNTER — Ambulatory Visit (INDEPENDENT_AMBULATORY_CARE_PROVIDER_SITE_OTHER): Payer: Medicare Other

## 2021-11-12 DIAGNOSIS — I639 Cerebral infarction, unspecified: Secondary | ICD-10-CM | POA: Diagnosis not present

## 2021-11-12 LAB — CUP PACEART REMOTE DEVICE CHECK
Date Time Interrogation Session: 20230706115107
Implantable Pulse Generator Implant Date: 20220617

## 2021-12-01 NOTE — Progress Notes (Signed)
Carelink Summary Report / Loop Recorder 

## 2021-12-14 ENCOUNTER — Ambulatory Visit (INDEPENDENT_AMBULATORY_CARE_PROVIDER_SITE_OTHER): Payer: Medicare Other

## 2021-12-14 DIAGNOSIS — I639 Cerebral infarction, unspecified: Secondary | ICD-10-CM

## 2021-12-16 LAB — CUP PACEART REMOTE DEVICE CHECK
Date Time Interrogation Session: 20230809060050
Implantable Pulse Generator Implant Date: 20220617

## 2022-01-05 ENCOUNTER — Other Ambulatory Visit: Payer: Self-pay

## 2022-01-05 MED ORDER — ROSUVASTATIN CALCIUM 20 MG PO TABS: 20.0000 mg | ORAL_TABLET | Freq: Every day | ORAL | 3 refills | Status: AC

## 2022-01-07 DIAGNOSIS — H6123 Impacted cerumen, bilateral: Secondary | ICD-10-CM | POA: Diagnosis not present

## 2022-01-07 DIAGNOSIS — H9193 Unspecified hearing loss, bilateral: Secondary | ICD-10-CM | POA: Diagnosis not present

## 2022-01-07 DIAGNOSIS — M8589 Other specified disorders of bone density and structure, multiple sites: Secondary | ICD-10-CM | POA: Diagnosis not present

## 2022-01-14 ENCOUNTER — Ambulatory Visit (INDEPENDENT_AMBULATORY_CARE_PROVIDER_SITE_OTHER): Payer: Medicare Other

## 2022-01-14 DIAGNOSIS — I639 Cerebral infarction, unspecified: Secondary | ICD-10-CM | POA: Diagnosis not present

## 2022-01-14 NOTE — Progress Notes (Signed)
Carelink Summary Report / Loop Recorder 

## 2022-01-18 ENCOUNTER — Other Ambulatory Visit (HOSPITAL_COMMUNITY): Payer: Self-pay | Admitting: *Deleted

## 2022-01-19 ENCOUNTER — Ambulatory Visit (HOSPITAL_COMMUNITY)
Admission: RE | Admit: 2022-01-19 | Discharge: 2022-01-19 | Disposition: A | Discharge status: Home / Self Care | Payer: Medicare Other | Source: Ambulatory Visit | Attending: Internal Medicine | Admitting: Internal Medicine

## 2022-01-19 DIAGNOSIS — M810 Age-related osteoporosis without current pathological fracture: Secondary | ICD-10-CM | POA: Insufficient documentation

## 2022-01-19 LAB — CUP PACEART REMOTE DEVICE CHECK
Date Time Interrogation Session: 20230907081556
Implantable Pulse Generator Implant Date: 20220617

## 2022-01-19 MED ORDER — DENOSUMAB 60 MG/ML ~~LOC~~ SOSY
60.0000 mg | PREFILLED_SYRINGE | Freq: Once | SUBCUTANEOUS | Status: AC
  Administered 2022-01-19: 60 mg via SUBCUTANEOUS

## 2022-01-19 MED ORDER — DENOSUMAB 60 MG/ML ~~LOC~~ SOSY
PREFILLED_SYRINGE | SUBCUTANEOUS | Status: AC
  Filled 2022-01-19: qty 1

## 2022-01-30 NOTE — Progress Notes (Signed)
Carelink Summary Report / Loop Recorder 

## 2022-02-04 DIAGNOSIS — Z23 Encounter for immunization: Secondary | ICD-10-CM | POA: Diagnosis not present

## 2022-02-15 ENCOUNTER — Ambulatory Visit (INDEPENDENT_AMBULATORY_CARE_PROVIDER_SITE_OTHER): Payer: Medicare Other

## 2022-02-15 DIAGNOSIS — I639 Cerebral infarction, unspecified: Secondary | ICD-10-CM

## 2022-02-19 LAB — CUP PACEART REMOTE DEVICE CHECK
Date Time Interrogation Session: 20231012230137
Implantable Pulse Generator Implant Date: 20220617

## 2022-02-24 NOTE — Progress Notes (Signed)
Carelink Summary Report / Loop Recorder 

## 2022-03-18 ENCOUNTER — Ambulatory Visit (INDEPENDENT_AMBULATORY_CARE_PROVIDER_SITE_OTHER): Payer: Medicare Other

## 2022-03-18 DIAGNOSIS — I639 Cerebral infarction, unspecified: Secondary | ICD-10-CM | POA: Diagnosis not present

## 2022-03-18 LAB — CUP PACEART REMOTE DEVICE CHECK
Date Time Interrogation Session: 20231108230238
Implantable Pulse Generator Implant Date: 20220617

## 2022-03-26 NOTE — Progress Notes (Signed)
Carelink Summary Report / Loop Recorder 

## 2022-04-05 ENCOUNTER — Telehealth: Payer: Self-pay

## 2022-04-05 NOTE — Telephone Encounter (Signed)
CV Remote Solution Alert Received.   ILR alert report received. Battery status OK. Normal device function. No new symptom, tachy, brady, or pause episodes. One new 2 minute AF episode that appears to be true AF, sent to triage.  No OAC.   AF burden is 0.1% of the time.  Monthly summary reports and ROV/PRN.  Spoke to patients son [name] who is on DPR, advised AF noted on ILR, reviewed with MD in clinic and recommend AF clinic referral to discuss options of [place]. Voiced understanding and agreeable to plan. Scott requested we call him for apts and info as patient is in assisted living.   Scott [phone].

## 2022-04-06 NOTE — Telephone Encounter (Signed)
Appt made per scott request.

## 2022-04-15 ENCOUNTER — Inpatient Hospital Stay (HOSPITAL_COMMUNITY): Admission: RE | Admit: 2022-04-15 | Payer: Medicare Other | Source: Ambulatory Visit | Admitting: Nurse Practitioner

## 2022-04-15 ENCOUNTER — Emergency Department (HOSPITAL_COMMUNITY)
Admission: EM | Admit: 2022-04-15 | Discharge: 2022-04-15 | Disposition: A | Discharge status: Home / Self Care | Payer: Medicare Other | Source: Home / Self Care

## 2022-04-15 NOTE — Telephone Encounter (Signed)
Per Dr. Curt Bears --  Episode was short, and appears to be sinus rhythm with PACs. Not atrial fibrillation. Appt canceled.

## 2022-04-19 ENCOUNTER — Ambulatory Visit (INDEPENDENT_AMBULATORY_CARE_PROVIDER_SITE_OTHER): Payer: Medicare Other

## 2022-04-19 DIAGNOSIS — I639 Cerebral infarction, unspecified: Secondary | ICD-10-CM | POA: Diagnosis not present

## 2022-04-19 LAB — CUP PACEART REMOTE DEVICE CHECK
Date Time Interrogation Session: 20231210230253
Implantable Pulse Generator Implant Date: 20220617

## 2022-04-27 ENCOUNTER — Telehealth: Payer: Self-pay

## 2022-04-27 NOTE — Telephone Encounter (Signed)
        Patient  visited [place] on 12/7    Telephone encounter attempt :  1st  A HIPAA compliant voice message was left requesting a return call.  Instructed patient to call back     [facility]  [phone] [address], [place], [place] [address] Phone: [phone] Email: [email or web address]

## 2022-04-28 ENCOUNTER — Telehealth: Payer: Self-pay

## 2022-04-28 NOTE — Telephone Encounter (Signed)
        Patient  visited [place] on 12/7     Telephone encounter attempt :  2nd  A HIPAA compliant voice message was left requesting a return call.  Instructed patient to call back .    [facility]  [phone] [address], [place], [place] [address] Phone: [phone] Email: [email or web address]

## 2022-05-20 ENCOUNTER — Ambulatory Visit (INDEPENDENT_AMBULATORY_CARE_PROVIDER_SITE_OTHER): Payer: Medicare Other

## 2022-05-20 DIAGNOSIS — I639 Cerebral infarction, unspecified: Secondary | ICD-10-CM | POA: Diagnosis not present

## 2022-05-20 LAB — CUP PACEART REMOTE DEVICE CHECK
Date Time Interrogation Session: 20240110230455
Implantable Pulse Generator Implant Date: 20220617

## 2022-05-27 NOTE — Progress Notes (Signed)
[facility] Summary Report / Loop Recorder

## 2022-05-31 DIAGNOSIS — M8580 Other specified disorders of bone density and structure, unspecified site: Secondary | ICD-10-CM | POA: Diagnosis not present

## 2022-05-31 DIAGNOSIS — I10 Essential (primary) hypertension: Secondary | ICD-10-CM | POA: Diagnosis not present

## 2022-05-31 DIAGNOSIS — F321 Major depressive disorder, single episode, moderate: Secondary | ICD-10-CM | POA: Diagnosis not present

## 2022-05-31 DIAGNOSIS — Z85820 Personal history of malignant melanoma of skin: Secondary | ICD-10-CM | POA: Diagnosis not present

## 2022-05-31 DIAGNOSIS — H6123 Impacted cerumen, bilateral: Secondary | ICD-10-CM | POA: Diagnosis not present

## 2022-05-31 DIAGNOSIS — E785 Hyperlipidemia, unspecified: Secondary | ICD-10-CM | POA: Diagnosis not present

## 2022-05-31 DIAGNOSIS — G309 Alzheimer's disease, unspecified: Secondary | ICD-10-CM | POA: Diagnosis not present

## 2022-05-31 DIAGNOSIS — I447 Left bundle-branch block, unspecified: Secondary | ICD-10-CM | POA: Diagnosis not present

## 2022-05-31 DIAGNOSIS — E538 Deficiency of other specified B group vitamins: Secondary | ICD-10-CM | POA: Diagnosis not present

## 2022-05-31 DIAGNOSIS — E559 Vitamin D deficiency, unspecified: Secondary | ICD-10-CM | POA: Diagnosis not present

## 2022-05-31 DIAGNOSIS — R7301 Impaired fasting glucose: Secondary | ICD-10-CM | POA: Diagnosis not present

## 2022-06-07 DIAGNOSIS — L84 Corns and callosities: Secondary | ICD-10-CM | POA: Diagnosis not present

## 2022-06-07 DIAGNOSIS — F02B0 Dementia in other diseases classified elsewhere, moderate, without behavioral disturbance, psychotic disturbance, mood disturbance, and anxiety: Secondary | ICD-10-CM | POA: Diagnosis not present

## 2022-06-09 NOTE — Progress Notes (Signed)
[facility] Summary Report / Loop Recorder

## 2022-06-17 ENCOUNTER — Encounter (HOSPITAL_COMMUNITY): Payer: Self-pay | Admitting: *Deleted

## 2022-06-21 ENCOUNTER — Ambulatory Visit: Payer: Medicare Other

## 2022-06-21 DIAGNOSIS — I639 Cerebral infarction, unspecified: Secondary | ICD-10-CM

## 2022-06-22 LAB — CUP PACEART REMOTE DEVICE CHECK
Date Time Interrogation Session: 20240212231125
Implantable Pulse Generator Implant Date: 20220617

## 2022-06-24 ENCOUNTER — Encounter: Payer: Self-pay | Admitting: Podiatry

## 2022-06-24 ENCOUNTER — Ambulatory Visit (INDEPENDENT_AMBULATORY_CARE_PROVIDER_SITE_OTHER): Payer: Medicare Other | Admitting: Podiatry

## 2022-06-24 DIAGNOSIS — D689 Coagulation defect, unspecified: Secondary | ICD-10-CM

## 2022-06-24 DIAGNOSIS — M21619 Bunion of unspecified foot: Secondary | ICD-10-CM

## 2022-06-24 DIAGNOSIS — L84 Corns and callosities: Secondary | ICD-10-CM

## 2022-06-24 DIAGNOSIS — M2041 Other hammer toe(s) (acquired), right foot: Secondary | ICD-10-CM | POA: Diagnosis not present

## 2022-06-24 DIAGNOSIS — I639 Cerebral infarction, unspecified: Secondary | ICD-10-CM

## 2022-06-24 NOTE — Progress Notes (Signed)
Subjective:   Patient ID: Pamela Mason, female   DOB: 77 y.o.   MRN: [id number]   HPI Patient presents with caregiver with lesion on the big toe second toe of the right foot with patient being at risk on blood thinner.  Patient does have dementia appears to get sore hard to get complete answer from her and also is noted to have structural deformity right over left foot.  Patient does not smoke is not active   Review of Systems  All other systems reviewed and are negative.       Objective:  Physical Exam Vitals and nursing note reviewed.  Constitutional:      Appearance: She is well-developed.  Pulmonary:     Effort: Pulmonary effort is normal.  Musculoskeletal:        General: Normal range of motion.  Skin:    General: Skin is warm.  Neurological:     Mental Status: She is alert.     Neurovascular status intact muscle strength found to be adequate range of motion adequate with patient found to have significant corn callus formation of the second digit big toe right foot in between the toes with mild enlargement of the hallux pressing against the second toe structural bunion deformity with redness around the first metatarsal head noted.  Good digital perfusion well-oriented     Assessment:  Hammertoe deformity of the second toe left structural bunion deformity chronic keratotic lesion x 2 with pain and patient who is on blood thinner     Plan:  H&P all conditions reviewed discussed and education to caregiver also rendered.  Sterile sharp debridement of lesion of the second toe big toe left slight bleeding second toe applied sterile dressing with Neosporin and discussed cushioning wider shoes softer materials do not recommend current bunion correction

## 2022-06-25 ENCOUNTER — Ambulatory Visit: Payer: Medicare Other | Admitting: Podiatry

## 2022-07-26 ENCOUNTER — Ambulatory Visit (INDEPENDENT_AMBULATORY_CARE_PROVIDER_SITE_OTHER): Payer: Medicare Other

## 2022-07-26 DIAGNOSIS — I639 Cerebral infarction, unspecified: Secondary | ICD-10-CM

## 2022-07-28 LAB — CUP PACEART REMOTE DEVICE CHECK
Date Time Interrogation Session: 20240317231328
Implantable Pulse Generator Implant Date: 20220617

## 2022-08-04 NOTE — Progress Notes (Signed)
Carelink Summary Report / Loop Recorder 

## 2022-08-30 ENCOUNTER — Ambulatory Visit (INDEPENDENT_AMBULATORY_CARE_PROVIDER_SITE_OTHER): Payer: Medicare Other

## 2022-08-30 DIAGNOSIS — I639 Cerebral infarction, unspecified: Secondary | ICD-10-CM

## 2022-08-30 LAB — CUP PACEART REMOTE DEVICE CHECK
Date Time Interrogation Session: 20240419230416
Implantable Pulse Generator Implant Date: 20220617

## 2022-09-06 NOTE — Progress Notes (Signed)
Carelink Summary Report / Loop Recorder 

## 2022-09-29 ENCOUNTER — Ambulatory Visit (INDEPENDENT_AMBULATORY_CARE_PROVIDER_SITE_OTHER): Payer: Medicare Other

## 2022-09-29 DIAGNOSIS — I639 Cerebral infarction, unspecified: Secondary | ICD-10-CM

## 2022-09-30 LAB — CUP PACEART REMOTE DEVICE CHECK
Date Time Interrogation Session: 20240522231123
Implantable Pulse Generator Implant Date: 20220617

## 2022-10-05 NOTE — Progress Notes (Signed)
Carelink Summary Report / Loop Recorder 

## 2022-10-12 ENCOUNTER — Ambulatory Visit: Payer: Medicare Other | Admitting: Adult Health

## 2022-10-12 DIAGNOSIS — F02B0 Dementia in other diseases classified elsewhere, moderate, without behavioral disturbance, psychotic disturbance, mood disturbance, and anxiety: Secondary | ICD-10-CM | POA: Diagnosis not present

## 2022-10-12 DIAGNOSIS — N3000 Acute cystitis without hematuria: Secondary | ICD-10-CM | POA: Diagnosis not present

## 2022-10-12 DIAGNOSIS — N390 Urinary tract infection, site not specified: Secondary | ICD-10-CM | POA: Diagnosis not present

## 2022-10-12 DIAGNOSIS — R350 Frequency of micturition: Secondary | ICD-10-CM | POA: Diagnosis not present

## 2022-10-20 NOTE — Progress Notes (Signed)
Carelink Summary Report / Loop Recorder 

## 2022-11-01 ENCOUNTER — Ambulatory Visit (INDEPENDENT_AMBULATORY_CARE_PROVIDER_SITE_OTHER): Payer: Medicare Other

## 2022-11-01 DIAGNOSIS — I639 Cerebral infarction, unspecified: Secondary | ICD-10-CM | POA: Diagnosis not present

## 2022-11-02 LAB — CUP PACEART REMOTE DEVICE CHECK
Date Time Interrogation Session: 20240624230030
Implantable Pulse Generator Implant Date: 20220617

## 2022-11-08 ENCOUNTER — Ambulatory Visit: Payer: Medicare Other

## 2022-11-19 NOTE — Progress Notes (Signed)
Carelink Summary Report / Loop Recorder 

## 2022-12-06 ENCOUNTER — Ambulatory Visit: Payer: Medicare Other

## 2022-12-06 DIAGNOSIS — I639 Cerebral infarction, unspecified: Secondary | ICD-10-CM | POA: Diagnosis not present

## 2022-12-06 LAB — CUP PACEART REMOTE DEVICE CHECK
Date Time Interrogation Session: 20240727230235
Implantable Pulse Generator Implant Date: 20220617

## 2022-12-13 ENCOUNTER — Ambulatory Visit: Payer: Medicare Other

## 2022-12-13 DIAGNOSIS — E785 Hyperlipidemia, unspecified: Secondary | ICD-10-CM | POA: Diagnosis not present

## 2022-12-13 DIAGNOSIS — I2510 Atherosclerotic heart disease of native coronary artery without angina pectoris: Secondary | ICD-10-CM | POA: Diagnosis not present

## 2022-12-13 DIAGNOSIS — F02B0 Dementia in other diseases classified elsewhere, moderate, without behavioral disturbance, psychotic disturbance, mood disturbance, and anxiety: Secondary | ICD-10-CM | POA: Diagnosis not present

## 2022-12-13 DIAGNOSIS — R7611 Nonspecific reaction to tuberculin skin test without active tuberculosis: Secondary | ICD-10-CM | POA: Diagnosis not present

## 2022-12-13 DIAGNOSIS — E559 Vitamin D deficiency, unspecified: Secondary | ICD-10-CM | POA: Diagnosis not present

## 2022-12-13 DIAGNOSIS — Z85820 Personal history of malignant melanoma of skin: Secondary | ICD-10-CM | POA: Diagnosis not present

## 2022-12-13 DIAGNOSIS — R410 Disorientation, unspecified: Secondary | ICD-10-CM | POA: Diagnosis not present

## 2022-12-13 DIAGNOSIS — I10 Essential (primary) hypertension: Secondary | ICD-10-CM | POA: Diagnosis not present

## 2022-12-13 DIAGNOSIS — I639 Cerebral infarction, unspecified: Secondary | ICD-10-CM | POA: Diagnosis not present

## 2022-12-13 DIAGNOSIS — R269 Unspecified abnormalities of gait and mobility: Secondary | ICD-10-CM | POA: Diagnosis not present

## 2022-12-13 DIAGNOSIS — Z7902 Long term (current) use of antithrombotics/antiplatelets: Secondary | ICD-10-CM | POA: Diagnosis not present

## 2022-12-20 DIAGNOSIS — Z79899 Other long term (current) drug therapy: Secondary | ICD-10-CM | POA: Diagnosis not present

## 2022-12-20 DIAGNOSIS — E785 Hyperlipidemia, unspecified: Secondary | ICD-10-CM | POA: Diagnosis not present

## 2022-12-20 DIAGNOSIS — I10 Essential (primary) hypertension: Secondary | ICD-10-CM | POA: Diagnosis not present

## 2022-12-20 DIAGNOSIS — I2510 Atherosclerotic heart disease of native coronary artery without angina pectoris: Secondary | ICD-10-CM | POA: Diagnosis not present

## 2022-12-22 NOTE — Progress Notes (Signed)
Carelink Summary Report / Loop Recorder 

## 2023-01-09 LAB — CUP PACEART REMOTE DEVICE CHECK
Date Time Interrogation Session: 20240829230236
Implantable Pulse Generator Implant Date: 20220617

## 2023-01-11 ENCOUNTER — Ambulatory Visit (INDEPENDENT_AMBULATORY_CARE_PROVIDER_SITE_OTHER): Payer: Medicare Other

## 2023-01-11 DIAGNOSIS — I639 Cerebral infarction, unspecified: Secondary | ICD-10-CM

## 2023-01-18 DIAGNOSIS — G309 Alzheimer's disease, unspecified: Secondary | ICD-10-CM | POA: Diagnosis not present

## 2023-01-18 DIAGNOSIS — E559 Vitamin D deficiency, unspecified: Secondary | ICD-10-CM | POA: Diagnosis not present

## 2023-01-18 DIAGNOSIS — M8580 Other specified disorders of bone density and structure, unspecified site: Secondary | ICD-10-CM | POA: Diagnosis not present

## 2023-01-18 DIAGNOSIS — Z66 Do not resuscitate: Secondary | ICD-10-CM | POA: Diagnosis not present

## 2023-01-18 DIAGNOSIS — E785 Hyperlipidemia, unspecified: Secondary | ICD-10-CM | POA: Diagnosis not present

## 2023-01-18 DIAGNOSIS — I10 Essential (primary) hypertension: Secondary | ICD-10-CM | POA: Diagnosis not present

## 2023-01-18 DIAGNOSIS — I639 Cerebral infarction, unspecified: Secondary | ICD-10-CM | POA: Diagnosis not present

## 2023-01-20 NOTE — Progress Notes (Signed)
Carelink Summary Report / Loop Recorder 

## 2023-01-25 DIAGNOSIS — M6281 Muscle weakness (generalized): Secondary | ICD-10-CM | POA: Diagnosis not present

## 2023-01-25 DIAGNOSIS — R41841 Cognitive communication deficit: Secondary | ICD-10-CM | POA: Diagnosis not present

## 2023-01-25 DIAGNOSIS — I639 Cerebral infarction, unspecified: Secondary | ICD-10-CM | POA: Diagnosis not present

## 2023-01-25 DIAGNOSIS — G319 Degenerative disease of nervous system, unspecified: Secondary | ICD-10-CM | POA: Diagnosis not present

## 2023-01-25 DIAGNOSIS — R4189 Other symptoms and signs involving cognitive functions and awareness: Secondary | ICD-10-CM | POA: Diagnosis not present

## 2023-01-25 DIAGNOSIS — R2681 Unsteadiness on feet: Secondary | ICD-10-CM | POA: Diagnosis not present

## 2023-01-25 DIAGNOSIS — M25561 Pain in right knee: Secondary | ICD-10-CM | POA: Diagnosis not present

## 2023-01-27 DIAGNOSIS — M6281 Muscle weakness (generalized): Secondary | ICD-10-CM | POA: Diagnosis not present

## 2023-01-27 DIAGNOSIS — R4189 Other symptoms and signs involving cognitive functions and awareness: Secondary | ICD-10-CM | POA: Diagnosis not present

## 2023-01-27 DIAGNOSIS — I10 Essential (primary) hypertension: Secondary | ICD-10-CM | POA: Diagnosis not present

## 2023-01-27 DIAGNOSIS — E785 Hyperlipidemia, unspecified: Secondary | ICD-10-CM | POA: Diagnosis not present

## 2023-01-27 DIAGNOSIS — R41841 Cognitive communication deficit: Secondary | ICD-10-CM | POA: Diagnosis not present

## 2023-01-27 DIAGNOSIS — M25561 Pain in right knee: Secondary | ICD-10-CM | POA: Diagnosis not present

## 2023-01-27 DIAGNOSIS — G319 Degenerative disease of nervous system, unspecified: Secondary | ICD-10-CM | POA: Diagnosis not present

## 2023-01-27 DIAGNOSIS — I639 Cerebral infarction, unspecified: Secondary | ICD-10-CM | POA: Diagnosis not present

## 2023-01-27 DIAGNOSIS — R2681 Unsteadiness on feet: Secondary | ICD-10-CM | POA: Diagnosis not present

## 2023-01-28 DIAGNOSIS — I639 Cerebral infarction, unspecified: Secondary | ICD-10-CM | POA: Diagnosis not present

## 2023-01-28 DIAGNOSIS — R2681 Unsteadiness on feet: Secondary | ICD-10-CM | POA: Diagnosis not present

## 2023-01-28 DIAGNOSIS — M25561 Pain in right knee: Secondary | ICD-10-CM | POA: Diagnosis not present

## 2023-01-28 DIAGNOSIS — M6281 Muscle weakness (generalized): Secondary | ICD-10-CM | POA: Diagnosis not present

## 2023-01-31 DIAGNOSIS — I639 Cerebral infarction, unspecified: Secondary | ICD-10-CM | POA: Diagnosis not present

## 2023-01-31 DIAGNOSIS — R41841 Cognitive communication deficit: Secondary | ICD-10-CM | POA: Diagnosis not present

## 2023-01-31 DIAGNOSIS — R4189 Other symptoms and signs involving cognitive functions and awareness: Secondary | ICD-10-CM | POA: Diagnosis not present

## 2023-01-31 DIAGNOSIS — G319 Degenerative disease of nervous system, unspecified: Secondary | ICD-10-CM | POA: Diagnosis not present

## 2023-02-01 DIAGNOSIS — G309 Alzheimer's disease, unspecified: Secondary | ICD-10-CM | POA: Diagnosis not present

## 2023-02-01 DIAGNOSIS — G319 Degenerative disease of nervous system, unspecified: Secondary | ICD-10-CM | POA: Diagnosis not present

## 2023-02-01 DIAGNOSIS — I10 Essential (primary) hypertension: Secondary | ICD-10-CM | POA: Diagnosis not present

## 2023-02-01 DIAGNOSIS — R41841 Cognitive communication deficit: Secondary | ICD-10-CM | POA: Diagnosis not present

## 2023-02-01 DIAGNOSIS — R2681 Unsteadiness on feet: Secondary | ICD-10-CM | POA: Diagnosis not present

## 2023-02-01 DIAGNOSIS — I639 Cerebral infarction, unspecified: Secondary | ICD-10-CM | POA: Diagnosis not present

## 2023-02-01 DIAGNOSIS — E785 Hyperlipidemia, unspecified: Secondary | ICD-10-CM | POA: Diagnosis not present

## 2023-02-01 DIAGNOSIS — R4189 Other symptoms and signs involving cognitive functions and awareness: Secondary | ICD-10-CM | POA: Diagnosis not present

## 2023-02-01 DIAGNOSIS — M25561 Pain in right knee: Secondary | ICD-10-CM | POA: Diagnosis not present

## 2023-02-01 DIAGNOSIS — M6281 Muscle weakness (generalized): Secondary | ICD-10-CM | POA: Diagnosis not present

## 2023-02-02 DIAGNOSIS — I639 Cerebral infarction, unspecified: Secondary | ICD-10-CM | POA: Diagnosis not present

## 2023-02-02 DIAGNOSIS — R278 Other lack of coordination: Secondary | ICD-10-CM | POA: Diagnosis not present

## 2023-02-02 DIAGNOSIS — M6281 Muscle weakness (generalized): Secondary | ICD-10-CM | POA: Diagnosis not present

## 2023-02-02 DIAGNOSIS — M25561 Pain in right knee: Secondary | ICD-10-CM | POA: Diagnosis not present

## 2023-02-02 DIAGNOSIS — N3946 Mixed incontinence: Secondary | ICD-10-CM | POA: Diagnosis not present

## 2023-02-02 DIAGNOSIS — R2681 Unsteadiness on feet: Secondary | ICD-10-CM | POA: Diagnosis not present

## 2023-02-03 DIAGNOSIS — N3946 Mixed incontinence: Secondary | ICD-10-CM | POA: Diagnosis not present

## 2023-02-03 DIAGNOSIS — G319 Degenerative disease of nervous system, unspecified: Secondary | ICD-10-CM | POA: Diagnosis not present

## 2023-02-03 DIAGNOSIS — R278 Other lack of coordination: Secondary | ICD-10-CM | POA: Diagnosis not present

## 2023-02-03 DIAGNOSIS — I639 Cerebral infarction, unspecified: Secondary | ICD-10-CM | POA: Diagnosis not present

## 2023-02-03 DIAGNOSIS — R4189 Other symptoms and signs involving cognitive functions and awareness: Secondary | ICD-10-CM | POA: Diagnosis not present

## 2023-02-03 DIAGNOSIS — M6281 Muscle weakness (generalized): Secondary | ICD-10-CM | POA: Diagnosis not present

## 2023-02-03 DIAGNOSIS — R41841 Cognitive communication deficit: Secondary | ICD-10-CM | POA: Diagnosis not present

## 2023-02-04 DIAGNOSIS — M6281 Muscle weakness (generalized): Secondary | ICD-10-CM | POA: Diagnosis not present

## 2023-02-04 DIAGNOSIS — I639 Cerebral infarction, unspecified: Secondary | ICD-10-CM | POA: Diagnosis not present

## 2023-02-04 DIAGNOSIS — R2681 Unsteadiness on feet: Secondary | ICD-10-CM | POA: Diagnosis not present

## 2023-02-04 DIAGNOSIS — M25561 Pain in right knee: Secondary | ICD-10-CM | POA: Diagnosis not present

## 2023-02-09 DIAGNOSIS — I639 Cerebral infarction, unspecified: Secondary | ICD-10-CM | POA: Diagnosis not present

## 2023-02-09 DIAGNOSIS — R2681 Unsteadiness on feet: Secondary | ICD-10-CM | POA: Diagnosis not present

## 2023-02-09 DIAGNOSIS — R278 Other lack of coordination: Secondary | ICD-10-CM | POA: Diagnosis not present

## 2023-02-09 DIAGNOSIS — N3946 Mixed incontinence: Secondary | ICD-10-CM | POA: Diagnosis not present

## 2023-02-09 DIAGNOSIS — M25561 Pain in right knee: Secondary | ICD-10-CM | POA: Diagnosis not present

## 2023-02-09 DIAGNOSIS — M6281 Muscle weakness (generalized): Secondary | ICD-10-CM | POA: Diagnosis not present

## 2023-02-09 LAB — CUP PACEART REMOTE DEVICE CHECK
Date Time Interrogation Session: 20241001230203
Implantable Pulse Generator Implant Date: 20220617

## 2023-02-10 DIAGNOSIS — I639 Cerebral infarction, unspecified: Secondary | ICD-10-CM | POA: Diagnosis not present

## 2023-02-10 DIAGNOSIS — R278 Other lack of coordination: Secondary | ICD-10-CM | POA: Diagnosis not present

## 2023-02-10 DIAGNOSIS — R41841 Cognitive communication deficit: Secondary | ICD-10-CM | POA: Diagnosis not present

## 2023-02-10 DIAGNOSIS — R4189 Other symptoms and signs involving cognitive functions and awareness: Secondary | ICD-10-CM | POA: Diagnosis not present

## 2023-02-10 DIAGNOSIS — N3946 Mixed incontinence: Secondary | ICD-10-CM | POA: Diagnosis not present

## 2023-02-10 DIAGNOSIS — M25561 Pain in right knee: Secondary | ICD-10-CM | POA: Diagnosis not present

## 2023-02-10 DIAGNOSIS — M6281 Muscle weakness (generalized): Secondary | ICD-10-CM | POA: Diagnosis not present

## 2023-02-10 DIAGNOSIS — R2681 Unsteadiness on feet: Secondary | ICD-10-CM | POA: Diagnosis not present

## 2023-02-10 DIAGNOSIS — G319 Degenerative disease of nervous system, unspecified: Secondary | ICD-10-CM | POA: Diagnosis not present

## 2023-02-11 DIAGNOSIS — R41841 Cognitive communication deficit: Secondary | ICD-10-CM | POA: Diagnosis not present

## 2023-02-11 DIAGNOSIS — M6281 Muscle weakness (generalized): Secondary | ICD-10-CM | POA: Diagnosis not present

## 2023-02-11 DIAGNOSIS — R278 Other lack of coordination: Secondary | ICD-10-CM | POA: Diagnosis not present

## 2023-02-11 DIAGNOSIS — G319 Degenerative disease of nervous system, unspecified: Secondary | ICD-10-CM | POA: Diagnosis not present

## 2023-02-11 DIAGNOSIS — I639 Cerebral infarction, unspecified: Secondary | ICD-10-CM | POA: Diagnosis not present

## 2023-02-11 DIAGNOSIS — N3946 Mixed incontinence: Secondary | ICD-10-CM | POA: Diagnosis not present

## 2023-02-11 DIAGNOSIS — R4189 Other symptoms and signs involving cognitive functions and awareness: Secondary | ICD-10-CM | POA: Diagnosis not present

## 2023-02-11 DIAGNOSIS — R2681 Unsteadiness on feet: Secondary | ICD-10-CM | POA: Diagnosis not present

## 2023-02-11 DIAGNOSIS — M25561 Pain in right knee: Secondary | ICD-10-CM | POA: Diagnosis not present

## 2023-02-14 ENCOUNTER — Ambulatory Visit (INDEPENDENT_AMBULATORY_CARE_PROVIDER_SITE_OTHER): Payer: Medicare Other

## 2023-02-14 DIAGNOSIS — R2681 Unsteadiness on feet: Secondary | ICD-10-CM | POA: Diagnosis not present

## 2023-02-14 DIAGNOSIS — N3946 Mixed incontinence: Secondary | ICD-10-CM | POA: Diagnosis not present

## 2023-02-14 DIAGNOSIS — I639 Cerebral infarction, unspecified: Secondary | ICD-10-CM | POA: Diagnosis not present

## 2023-02-14 DIAGNOSIS — R41841 Cognitive communication deficit: Secondary | ICD-10-CM | POA: Diagnosis not present

## 2023-02-14 DIAGNOSIS — M6281 Muscle weakness (generalized): Secondary | ICD-10-CM | POA: Diagnosis not present

## 2023-02-14 DIAGNOSIS — R4189 Other symptoms and signs involving cognitive functions and awareness: Secondary | ICD-10-CM | POA: Diagnosis not present

## 2023-02-14 DIAGNOSIS — R278 Other lack of coordination: Secondary | ICD-10-CM | POA: Diagnosis not present

## 2023-02-14 DIAGNOSIS — M25561 Pain in right knee: Secondary | ICD-10-CM | POA: Diagnosis not present

## 2023-02-14 DIAGNOSIS — G319 Degenerative disease of nervous system, unspecified: Secondary | ICD-10-CM | POA: Diagnosis not present

## 2023-02-15 DIAGNOSIS — R2681 Unsteadiness on feet: Secondary | ICD-10-CM | POA: Diagnosis not present

## 2023-02-15 DIAGNOSIS — M25561 Pain in right knee: Secondary | ICD-10-CM | POA: Diagnosis not present

## 2023-02-15 DIAGNOSIS — R41841 Cognitive communication deficit: Secondary | ICD-10-CM | POA: Diagnosis not present

## 2023-02-15 DIAGNOSIS — M6281 Muscle weakness (generalized): Secondary | ICD-10-CM | POA: Diagnosis not present

## 2023-02-15 DIAGNOSIS — G319 Degenerative disease of nervous system, unspecified: Secondary | ICD-10-CM | POA: Diagnosis not present

## 2023-02-15 DIAGNOSIS — I639 Cerebral infarction, unspecified: Secondary | ICD-10-CM | POA: Diagnosis not present

## 2023-02-15 DIAGNOSIS — R4189 Other symptoms and signs involving cognitive functions and awareness: Secondary | ICD-10-CM | POA: Diagnosis not present

## 2023-02-16 DIAGNOSIS — M6281 Muscle weakness (generalized): Secondary | ICD-10-CM | POA: Diagnosis not present

## 2023-02-16 DIAGNOSIS — Z79899 Other long term (current) drug therapy: Secondary | ICD-10-CM | POA: Diagnosis not present

## 2023-02-16 DIAGNOSIS — N3946 Mixed incontinence: Secondary | ICD-10-CM | POA: Diagnosis not present

## 2023-02-16 DIAGNOSIS — F430 Acute stress reaction: Secondary | ICD-10-CM | POA: Diagnosis not present

## 2023-02-16 DIAGNOSIS — F03A0 Unspecified dementia, mild, without behavioral disturbance, psychotic disturbance, mood disturbance, and anxiety: Secondary | ICD-10-CM | POA: Diagnosis not present

## 2023-02-16 DIAGNOSIS — Z9181 History of falling: Secondary | ICD-10-CM | POA: Diagnosis not present

## 2023-02-16 DIAGNOSIS — R630 Anorexia: Secondary | ICD-10-CM | POA: Diagnosis not present

## 2023-02-16 DIAGNOSIS — F5104 Psychophysiologic insomnia: Secondary | ICD-10-CM | POA: Diagnosis not present

## 2023-02-16 DIAGNOSIS — R278 Other lack of coordination: Secondary | ICD-10-CM | POA: Diagnosis not present

## 2023-02-16 DIAGNOSIS — I639 Cerebral infarction, unspecified: Secondary | ICD-10-CM | POA: Diagnosis not present

## 2023-02-17 DIAGNOSIS — M6281 Muscle weakness (generalized): Secondary | ICD-10-CM | POA: Diagnosis not present

## 2023-02-17 DIAGNOSIS — R2681 Unsteadiness on feet: Secondary | ICD-10-CM | POA: Diagnosis not present

## 2023-02-17 DIAGNOSIS — R41841 Cognitive communication deficit: Secondary | ICD-10-CM | POA: Diagnosis not present

## 2023-02-17 DIAGNOSIS — I639 Cerebral infarction, unspecified: Secondary | ICD-10-CM | POA: Diagnosis not present

## 2023-02-17 DIAGNOSIS — R278 Other lack of coordination: Secondary | ICD-10-CM | POA: Diagnosis not present

## 2023-02-17 DIAGNOSIS — M25561 Pain in right knee: Secondary | ICD-10-CM | POA: Diagnosis not present

## 2023-02-17 DIAGNOSIS — R4189 Other symptoms and signs involving cognitive functions and awareness: Secondary | ICD-10-CM | POA: Diagnosis not present

## 2023-02-17 DIAGNOSIS — G319 Degenerative disease of nervous system, unspecified: Secondary | ICD-10-CM | POA: Diagnosis not present

## 2023-02-17 DIAGNOSIS — N3946 Mixed incontinence: Secondary | ICD-10-CM | POA: Diagnosis not present

## 2023-02-18 DIAGNOSIS — Z23 Encounter for immunization: Secondary | ICD-10-CM | POA: Diagnosis not present

## 2023-02-21 DIAGNOSIS — N3946 Mixed incontinence: Secondary | ICD-10-CM | POA: Diagnosis not present

## 2023-02-21 DIAGNOSIS — R278 Other lack of coordination: Secondary | ICD-10-CM | POA: Diagnosis not present

## 2023-02-21 DIAGNOSIS — M6281 Muscle weakness (generalized): Secondary | ICD-10-CM | POA: Diagnosis not present

## 2023-02-21 DIAGNOSIS — I639 Cerebral infarction, unspecified: Secondary | ICD-10-CM | POA: Diagnosis not present

## 2023-02-22 DIAGNOSIS — I639 Cerebral infarction, unspecified: Secondary | ICD-10-CM | POA: Diagnosis not present

## 2023-02-22 DIAGNOSIS — M6281 Muscle weakness (generalized): Secondary | ICD-10-CM | POA: Diagnosis not present

## 2023-02-22 DIAGNOSIS — R2681 Unsteadiness on feet: Secondary | ICD-10-CM | POA: Diagnosis not present

## 2023-02-22 DIAGNOSIS — M25561 Pain in right knee: Secondary | ICD-10-CM | POA: Diagnosis not present

## 2023-02-23 DIAGNOSIS — R278 Other lack of coordination: Secondary | ICD-10-CM | POA: Diagnosis not present

## 2023-02-23 DIAGNOSIS — M25561 Pain in right knee: Secondary | ICD-10-CM | POA: Diagnosis not present

## 2023-02-23 DIAGNOSIS — M6281 Muscle weakness (generalized): Secondary | ICD-10-CM | POA: Diagnosis not present

## 2023-02-23 DIAGNOSIS — I639 Cerebral infarction, unspecified: Secondary | ICD-10-CM | POA: Diagnosis not present

## 2023-02-23 DIAGNOSIS — N3946 Mixed incontinence: Secondary | ICD-10-CM | POA: Diagnosis not present

## 2023-02-23 DIAGNOSIS — R2681 Unsteadiness on feet: Secondary | ICD-10-CM | POA: Diagnosis not present

## 2023-02-24 DIAGNOSIS — M6281 Muscle weakness (generalized): Secondary | ICD-10-CM | POA: Diagnosis not present

## 2023-02-24 DIAGNOSIS — G319 Degenerative disease of nervous system, unspecified: Secondary | ICD-10-CM | POA: Diagnosis not present

## 2023-02-24 DIAGNOSIS — R41841 Cognitive communication deficit: Secondary | ICD-10-CM | POA: Diagnosis not present

## 2023-02-24 DIAGNOSIS — R4189 Other symptoms and signs involving cognitive functions and awareness: Secondary | ICD-10-CM | POA: Diagnosis not present

## 2023-02-24 DIAGNOSIS — I639 Cerebral infarction, unspecified: Secondary | ICD-10-CM | POA: Diagnosis not present

## 2023-02-24 DIAGNOSIS — M25561 Pain in right knee: Secondary | ICD-10-CM | POA: Diagnosis not present

## 2023-02-24 DIAGNOSIS — R2681 Unsteadiness on feet: Secondary | ICD-10-CM | POA: Diagnosis not present

## 2023-02-25 DIAGNOSIS — M6281 Muscle weakness (generalized): Secondary | ICD-10-CM | POA: Diagnosis not present

## 2023-02-25 DIAGNOSIS — G319 Degenerative disease of nervous system, unspecified: Secondary | ICD-10-CM | POA: Diagnosis not present

## 2023-02-25 DIAGNOSIS — R41841 Cognitive communication deficit: Secondary | ICD-10-CM | POA: Diagnosis not present

## 2023-02-25 DIAGNOSIS — N3946 Mixed incontinence: Secondary | ICD-10-CM | POA: Diagnosis not present

## 2023-02-25 DIAGNOSIS — R4189 Other symptoms and signs involving cognitive functions and awareness: Secondary | ICD-10-CM | POA: Diagnosis not present

## 2023-02-25 DIAGNOSIS — R278 Other lack of coordination: Secondary | ICD-10-CM | POA: Diagnosis not present

## 2023-02-25 DIAGNOSIS — I639 Cerebral infarction, unspecified: Secondary | ICD-10-CM | POA: Diagnosis not present

## 2023-02-28 DIAGNOSIS — N3946 Mixed incontinence: Secondary | ICD-10-CM | POA: Diagnosis not present

## 2023-02-28 DIAGNOSIS — G319 Degenerative disease of nervous system, unspecified: Secondary | ICD-10-CM | POA: Diagnosis not present

## 2023-02-28 DIAGNOSIS — M6281 Muscle weakness (generalized): Secondary | ICD-10-CM | POA: Diagnosis not present

## 2023-02-28 DIAGNOSIS — R4189 Other symptoms and signs involving cognitive functions and awareness: Secondary | ICD-10-CM | POA: Diagnosis not present

## 2023-02-28 DIAGNOSIS — R278 Other lack of coordination: Secondary | ICD-10-CM | POA: Diagnosis not present

## 2023-02-28 DIAGNOSIS — I639 Cerebral infarction, unspecified: Secondary | ICD-10-CM | POA: Diagnosis not present

## 2023-02-28 DIAGNOSIS — R41841 Cognitive communication deficit: Secondary | ICD-10-CM | POA: Diagnosis not present

## 2023-02-28 NOTE — Progress Notes (Signed)
Carelink Summary Report / Loop Recorder 

## 2023-03-01 DIAGNOSIS — M25561 Pain in right knee: Secondary | ICD-10-CM | POA: Diagnosis not present

## 2023-03-01 DIAGNOSIS — N3946 Mixed incontinence: Secondary | ICD-10-CM | POA: Diagnosis not present

## 2023-03-01 DIAGNOSIS — R278 Other lack of coordination: Secondary | ICD-10-CM | POA: Diagnosis not present

## 2023-03-01 DIAGNOSIS — M6281 Muscle weakness (generalized): Secondary | ICD-10-CM | POA: Diagnosis not present

## 2023-03-01 DIAGNOSIS — I639 Cerebral infarction, unspecified: Secondary | ICD-10-CM | POA: Diagnosis not present

## 2023-03-01 DIAGNOSIS — R2681 Unsteadiness on feet: Secondary | ICD-10-CM | POA: Diagnosis not present

## 2023-03-02 DIAGNOSIS — I639 Cerebral infarction, unspecified: Secondary | ICD-10-CM | POA: Diagnosis not present

## 2023-03-02 DIAGNOSIS — R41841 Cognitive communication deficit: Secondary | ICD-10-CM | POA: Diagnosis not present

## 2023-03-02 DIAGNOSIS — R4189 Other symptoms and signs involving cognitive functions and awareness: Secondary | ICD-10-CM | POA: Diagnosis not present

## 2023-03-02 DIAGNOSIS — M6281 Muscle weakness (generalized): Secondary | ICD-10-CM | POA: Diagnosis not present

## 2023-03-02 DIAGNOSIS — M25561 Pain in right knee: Secondary | ICD-10-CM | POA: Diagnosis not present

## 2023-03-02 DIAGNOSIS — G319 Degenerative disease of nervous system, unspecified: Secondary | ICD-10-CM | POA: Diagnosis not present

## 2023-03-02 DIAGNOSIS — R2681 Unsteadiness on feet: Secondary | ICD-10-CM | POA: Diagnosis not present

## 2023-03-03 DIAGNOSIS — I639 Cerebral infarction, unspecified: Secondary | ICD-10-CM | POA: Diagnosis not present

## 2023-03-03 DIAGNOSIS — R4189 Other symptoms and signs involving cognitive functions and awareness: Secondary | ICD-10-CM | POA: Diagnosis not present

## 2023-03-03 DIAGNOSIS — G319 Degenerative disease of nervous system, unspecified: Secondary | ICD-10-CM | POA: Diagnosis not present

## 2023-03-03 DIAGNOSIS — R41841 Cognitive communication deficit: Secondary | ICD-10-CM | POA: Diagnosis not present

## 2023-03-03 DIAGNOSIS — R278 Other lack of coordination: Secondary | ICD-10-CM | POA: Diagnosis not present

## 2023-03-03 DIAGNOSIS — N3946 Mixed incontinence: Secondary | ICD-10-CM | POA: Diagnosis not present

## 2023-03-03 DIAGNOSIS — M6281 Muscle weakness (generalized): Secondary | ICD-10-CM | POA: Diagnosis not present

## 2023-03-04 DIAGNOSIS — M25561 Pain in right knee: Secondary | ICD-10-CM | POA: Diagnosis not present

## 2023-03-04 DIAGNOSIS — I639 Cerebral infarction, unspecified: Secondary | ICD-10-CM | POA: Diagnosis not present

## 2023-03-04 DIAGNOSIS — R2681 Unsteadiness on feet: Secondary | ICD-10-CM | POA: Diagnosis not present

## 2023-03-04 DIAGNOSIS — M6281 Muscle weakness (generalized): Secondary | ICD-10-CM | POA: Diagnosis not present

## 2023-03-07 DIAGNOSIS — R41841 Cognitive communication deficit: Secondary | ICD-10-CM | POA: Diagnosis not present

## 2023-03-07 DIAGNOSIS — R4189 Other symptoms and signs involving cognitive functions and awareness: Secondary | ICD-10-CM | POA: Diagnosis not present

## 2023-03-07 DIAGNOSIS — G319 Degenerative disease of nervous system, unspecified: Secondary | ICD-10-CM | POA: Diagnosis not present

## 2023-03-07 DIAGNOSIS — N3946 Mixed incontinence: Secondary | ICD-10-CM | POA: Diagnosis not present

## 2023-03-07 DIAGNOSIS — M6281 Muscle weakness (generalized): Secondary | ICD-10-CM | POA: Diagnosis not present

## 2023-03-07 DIAGNOSIS — I639 Cerebral infarction, unspecified: Secondary | ICD-10-CM | POA: Diagnosis not present

## 2023-03-07 DIAGNOSIS — R278 Other lack of coordination: Secondary | ICD-10-CM | POA: Diagnosis not present

## 2023-03-08 DIAGNOSIS — R41841 Cognitive communication deficit: Secondary | ICD-10-CM | POA: Diagnosis not present

## 2023-03-08 DIAGNOSIS — I639 Cerebral infarction, unspecified: Secondary | ICD-10-CM | POA: Diagnosis not present

## 2023-03-08 DIAGNOSIS — M6281 Muscle weakness (generalized): Secondary | ICD-10-CM | POA: Diagnosis not present

## 2023-03-08 DIAGNOSIS — R4189 Other symptoms and signs involving cognitive functions and awareness: Secondary | ICD-10-CM | POA: Diagnosis not present

## 2023-03-08 DIAGNOSIS — G319 Degenerative disease of nervous system, unspecified: Secondary | ICD-10-CM | POA: Diagnosis not present

## 2023-03-08 DIAGNOSIS — M25561 Pain in right knee: Secondary | ICD-10-CM | POA: Diagnosis not present

## 2023-03-08 DIAGNOSIS — R2681 Unsteadiness on feet: Secondary | ICD-10-CM | POA: Diagnosis not present

## 2023-03-09 DIAGNOSIS — I639 Cerebral infarction, unspecified: Secondary | ICD-10-CM | POA: Diagnosis not present

## 2023-03-09 DIAGNOSIS — N3946 Mixed incontinence: Secondary | ICD-10-CM | POA: Diagnosis not present

## 2023-03-09 DIAGNOSIS — R278 Other lack of coordination: Secondary | ICD-10-CM | POA: Diagnosis not present

## 2023-03-09 DIAGNOSIS — M6281 Muscle weakness (generalized): Secondary | ICD-10-CM | POA: Diagnosis not present

## 2023-03-10 DIAGNOSIS — N3946 Mixed incontinence: Secondary | ICD-10-CM | POA: Diagnosis not present

## 2023-03-10 DIAGNOSIS — M6281 Muscle weakness (generalized): Secondary | ICD-10-CM | POA: Diagnosis not present

## 2023-03-10 DIAGNOSIS — R278 Other lack of coordination: Secondary | ICD-10-CM | POA: Diagnosis not present

## 2023-03-10 DIAGNOSIS — R4189 Other symptoms and signs involving cognitive functions and awareness: Secondary | ICD-10-CM | POA: Diagnosis not present

## 2023-03-10 DIAGNOSIS — R41841 Cognitive communication deficit: Secondary | ICD-10-CM | POA: Diagnosis not present

## 2023-03-10 DIAGNOSIS — R2681 Unsteadiness on feet: Secondary | ICD-10-CM | POA: Diagnosis not present

## 2023-03-10 DIAGNOSIS — M25561 Pain in right knee: Secondary | ICD-10-CM | POA: Diagnosis not present

## 2023-03-10 DIAGNOSIS — G319 Degenerative disease of nervous system, unspecified: Secondary | ICD-10-CM | POA: Diagnosis not present

## 2023-03-10 DIAGNOSIS — I639 Cerebral infarction, unspecified: Secondary | ICD-10-CM | POA: Diagnosis not present

## 2023-03-11 DIAGNOSIS — M6281 Muscle weakness (generalized): Secondary | ICD-10-CM | POA: Diagnosis not present

## 2023-03-11 DIAGNOSIS — I639 Cerebral infarction, unspecified: Secondary | ICD-10-CM | POA: Diagnosis not present

## 2023-03-11 DIAGNOSIS — M25561 Pain in right knee: Secondary | ICD-10-CM | POA: Diagnosis not present

## 2023-03-11 DIAGNOSIS — R2681 Unsteadiness on feet: Secondary | ICD-10-CM | POA: Diagnosis not present

## 2023-03-14 DIAGNOSIS — I639 Cerebral infarction, unspecified: Secondary | ICD-10-CM | POA: Diagnosis not present

## 2023-03-14 DIAGNOSIS — R4189 Other symptoms and signs involving cognitive functions and awareness: Secondary | ICD-10-CM | POA: Diagnosis not present

## 2023-03-14 DIAGNOSIS — R2681 Unsteadiness on feet: Secondary | ICD-10-CM | POA: Diagnosis not present

## 2023-03-14 DIAGNOSIS — N3946 Mixed incontinence: Secondary | ICD-10-CM | POA: Diagnosis not present

## 2023-03-14 DIAGNOSIS — G319 Degenerative disease of nervous system, unspecified: Secondary | ICD-10-CM | POA: Diagnosis not present

## 2023-03-14 DIAGNOSIS — M6281 Muscle weakness (generalized): Secondary | ICD-10-CM | POA: Diagnosis not present

## 2023-03-14 DIAGNOSIS — R41841 Cognitive communication deficit: Secondary | ICD-10-CM | POA: Diagnosis not present

## 2023-03-14 DIAGNOSIS — R278 Other lack of coordination: Secondary | ICD-10-CM | POA: Diagnosis not present

## 2023-03-14 DIAGNOSIS — M25561 Pain in right knee: Secondary | ICD-10-CM | POA: Diagnosis not present

## 2023-03-15 DIAGNOSIS — M6281 Muscle weakness (generalized): Secondary | ICD-10-CM | POA: Diagnosis not present

## 2023-03-15 DIAGNOSIS — I639 Cerebral infarction, unspecified: Secondary | ICD-10-CM | POA: Diagnosis not present

## 2023-03-15 DIAGNOSIS — R4189 Other symptoms and signs involving cognitive functions and awareness: Secondary | ICD-10-CM | POA: Diagnosis not present

## 2023-03-15 DIAGNOSIS — Z66 Do not resuscitate: Secondary | ICD-10-CM | POA: Diagnosis not present

## 2023-03-15 DIAGNOSIS — G319 Degenerative disease of nervous system, unspecified: Secondary | ICD-10-CM | POA: Diagnosis not present

## 2023-03-15 DIAGNOSIS — M25561 Pain in right knee: Secondary | ICD-10-CM | POA: Diagnosis not present

## 2023-03-15 DIAGNOSIS — R2681 Unsteadiness on feet: Secondary | ICD-10-CM | POA: Diagnosis not present

## 2023-03-15 DIAGNOSIS — Z0001 Encounter for general adult medical examination with abnormal findings: Secondary | ICD-10-CM | POA: Diagnosis not present

## 2023-03-15 DIAGNOSIS — R41841 Cognitive communication deficit: Secondary | ICD-10-CM | POA: Diagnosis not present

## 2023-03-16 DIAGNOSIS — R278 Other lack of coordination: Secondary | ICD-10-CM | POA: Diagnosis not present

## 2023-03-16 DIAGNOSIS — M25561 Pain in right knee: Secondary | ICD-10-CM | POA: Diagnosis not present

## 2023-03-16 DIAGNOSIS — M6281 Muscle weakness (generalized): Secondary | ICD-10-CM | POA: Diagnosis not present

## 2023-03-16 DIAGNOSIS — I639 Cerebral infarction, unspecified: Secondary | ICD-10-CM | POA: Diagnosis not present

## 2023-03-16 DIAGNOSIS — R2681 Unsteadiness on feet: Secondary | ICD-10-CM | POA: Diagnosis not present

## 2023-03-16 DIAGNOSIS — N3946 Mixed incontinence: Secondary | ICD-10-CM | POA: Diagnosis not present

## 2023-03-17 DIAGNOSIS — R278 Other lack of coordination: Secondary | ICD-10-CM | POA: Diagnosis not present

## 2023-03-17 DIAGNOSIS — I639 Cerebral infarction, unspecified: Secondary | ICD-10-CM | POA: Diagnosis not present

## 2023-03-17 DIAGNOSIS — N3946 Mixed incontinence: Secondary | ICD-10-CM | POA: Diagnosis not present

## 2023-03-17 DIAGNOSIS — R41841 Cognitive communication deficit: Secondary | ICD-10-CM | POA: Diagnosis not present

## 2023-03-17 DIAGNOSIS — M6281 Muscle weakness (generalized): Secondary | ICD-10-CM | POA: Diagnosis not present

## 2023-03-17 DIAGNOSIS — G319 Degenerative disease of nervous system, unspecified: Secondary | ICD-10-CM | POA: Diagnosis not present

## 2023-03-17 DIAGNOSIS — R4189 Other symptoms and signs involving cognitive functions and awareness: Secondary | ICD-10-CM | POA: Diagnosis not present

## 2023-03-21 ENCOUNTER — Ambulatory Visit: Payer: Medicare Other

## 2023-03-21 DIAGNOSIS — M6281 Muscle weakness (generalized): Secondary | ICD-10-CM | POA: Diagnosis not present

## 2023-03-21 DIAGNOSIS — R4189 Other symptoms and signs involving cognitive functions and awareness: Secondary | ICD-10-CM | POA: Diagnosis not present

## 2023-03-21 DIAGNOSIS — M25561 Pain in right knee: Secondary | ICD-10-CM | POA: Diagnosis not present

## 2023-03-21 DIAGNOSIS — R41841 Cognitive communication deficit: Secondary | ICD-10-CM | POA: Diagnosis not present

## 2023-03-21 DIAGNOSIS — R2681 Unsteadiness on feet: Secondary | ICD-10-CM | POA: Diagnosis not present

## 2023-03-21 DIAGNOSIS — I639 Cerebral infarction, unspecified: Secondary | ICD-10-CM | POA: Diagnosis not present

## 2023-03-21 DIAGNOSIS — G319 Degenerative disease of nervous system, unspecified: Secondary | ICD-10-CM | POA: Diagnosis not present

## 2023-03-21 DIAGNOSIS — M810 Age-related osteoporosis without current pathological fracture: Secondary | ICD-10-CM | POA: Diagnosis not present

## 2023-03-22 DIAGNOSIS — R278 Other lack of coordination: Secondary | ICD-10-CM | POA: Diagnosis not present

## 2023-03-22 DIAGNOSIS — I639 Cerebral infarction, unspecified: Secondary | ICD-10-CM | POA: Diagnosis not present

## 2023-03-22 DIAGNOSIS — F430 Acute stress reaction: Secondary | ICD-10-CM | POA: Diagnosis not present

## 2023-03-22 DIAGNOSIS — Z9181 History of falling: Secondary | ICD-10-CM | POA: Diagnosis not present

## 2023-03-22 DIAGNOSIS — N3946 Mixed incontinence: Secondary | ICD-10-CM | POA: Diagnosis not present

## 2023-03-22 DIAGNOSIS — R41841 Cognitive communication deficit: Secondary | ICD-10-CM | POA: Diagnosis not present

## 2023-03-22 DIAGNOSIS — R4189 Other symptoms and signs involving cognitive functions and awareness: Secondary | ICD-10-CM | POA: Diagnosis not present

## 2023-03-22 DIAGNOSIS — R630 Anorexia: Secondary | ICD-10-CM | POA: Diagnosis not present

## 2023-03-22 DIAGNOSIS — G308 Other Alzheimer's disease: Secondary | ICD-10-CM | POA: Diagnosis not present

## 2023-03-22 DIAGNOSIS — G319 Degenerative disease of nervous system, unspecified: Secondary | ICD-10-CM | POA: Diagnosis not present

## 2023-03-22 DIAGNOSIS — F03A0 Unspecified dementia, mild, without behavioral disturbance, psychotic disturbance, mood disturbance, and anxiety: Secondary | ICD-10-CM | POA: Diagnosis not present

## 2023-03-22 DIAGNOSIS — Z79899 Other long term (current) drug therapy: Secondary | ICD-10-CM | POA: Diagnosis not present

## 2023-03-22 DIAGNOSIS — F5104 Psychophysiologic insomnia: Secondary | ICD-10-CM | POA: Diagnosis not present

## 2023-03-22 DIAGNOSIS — M6281 Muscle weakness (generalized): Secondary | ICD-10-CM | POA: Diagnosis not present

## 2023-03-23 DIAGNOSIS — M6281 Muscle weakness (generalized): Secondary | ICD-10-CM | POA: Diagnosis not present

## 2023-03-23 DIAGNOSIS — M25561 Pain in right knee: Secondary | ICD-10-CM | POA: Diagnosis not present

## 2023-03-23 DIAGNOSIS — R278 Other lack of coordination: Secondary | ICD-10-CM | POA: Diagnosis not present

## 2023-03-23 DIAGNOSIS — N3946 Mixed incontinence: Secondary | ICD-10-CM | POA: Diagnosis not present

## 2023-03-23 DIAGNOSIS — R2681 Unsteadiness on feet: Secondary | ICD-10-CM | POA: Diagnosis not present

## 2023-03-23 DIAGNOSIS — I639 Cerebral infarction, unspecified: Secondary | ICD-10-CM | POA: Diagnosis not present

## 2023-03-24 DIAGNOSIS — R2681 Unsteadiness on feet: Secondary | ICD-10-CM | POA: Diagnosis not present

## 2023-03-24 DIAGNOSIS — I639 Cerebral infarction, unspecified: Secondary | ICD-10-CM | POA: Diagnosis not present

## 2023-03-24 DIAGNOSIS — R278 Other lack of coordination: Secondary | ICD-10-CM | POA: Diagnosis not present

## 2023-03-24 DIAGNOSIS — M25561 Pain in right knee: Secondary | ICD-10-CM | POA: Diagnosis not present

## 2023-03-24 DIAGNOSIS — N3946 Mixed incontinence: Secondary | ICD-10-CM | POA: Diagnosis not present

## 2023-03-24 DIAGNOSIS — M6281 Muscle weakness (generalized): Secondary | ICD-10-CM | POA: Diagnosis not present

## 2023-03-25 DIAGNOSIS — R41841 Cognitive communication deficit: Secondary | ICD-10-CM | POA: Diagnosis not present

## 2023-03-25 DIAGNOSIS — R4189 Other symptoms and signs involving cognitive functions and awareness: Secondary | ICD-10-CM | POA: Diagnosis not present

## 2023-03-25 DIAGNOSIS — I639 Cerebral infarction, unspecified: Secondary | ICD-10-CM | POA: Diagnosis not present

## 2023-03-25 DIAGNOSIS — G319 Degenerative disease of nervous system, unspecified: Secondary | ICD-10-CM | POA: Diagnosis not present

## 2023-03-28 DIAGNOSIS — G319 Degenerative disease of nervous system, unspecified: Secondary | ICD-10-CM | POA: Diagnosis not present

## 2023-03-28 DIAGNOSIS — N3946 Mixed incontinence: Secondary | ICD-10-CM | POA: Diagnosis not present

## 2023-03-28 DIAGNOSIS — R2681 Unsteadiness on feet: Secondary | ICD-10-CM | POA: Diagnosis not present

## 2023-03-28 DIAGNOSIS — M25561 Pain in right knee: Secondary | ICD-10-CM | POA: Diagnosis not present

## 2023-03-28 DIAGNOSIS — M6281 Muscle weakness (generalized): Secondary | ICD-10-CM | POA: Diagnosis not present

## 2023-03-28 DIAGNOSIS — I639 Cerebral infarction, unspecified: Secondary | ICD-10-CM | POA: Diagnosis not present

## 2023-03-28 DIAGNOSIS — R4189 Other symptoms and signs involving cognitive functions and awareness: Secondary | ICD-10-CM | POA: Diagnosis not present

## 2023-03-28 DIAGNOSIS — R41841 Cognitive communication deficit: Secondary | ICD-10-CM | POA: Diagnosis not present

## 2023-03-28 DIAGNOSIS — R278 Other lack of coordination: Secondary | ICD-10-CM | POA: Diagnosis not present

## 2023-03-29 DIAGNOSIS — I639 Cerebral infarction, unspecified: Secondary | ICD-10-CM | POA: Diagnosis not present

## 2023-03-29 DIAGNOSIS — G319 Degenerative disease of nervous system, unspecified: Secondary | ICD-10-CM | POA: Diagnosis not present

## 2023-03-29 DIAGNOSIS — M25561 Pain in right knee: Secondary | ICD-10-CM | POA: Diagnosis not present

## 2023-03-29 DIAGNOSIS — R4189 Other symptoms and signs involving cognitive functions and awareness: Secondary | ICD-10-CM | POA: Diagnosis not present

## 2023-03-29 DIAGNOSIS — M6281 Muscle weakness (generalized): Secondary | ICD-10-CM | POA: Diagnosis not present

## 2023-03-29 DIAGNOSIS — N3946 Mixed incontinence: Secondary | ICD-10-CM | POA: Diagnosis not present

## 2023-03-29 DIAGNOSIS — R41841 Cognitive communication deficit: Secondary | ICD-10-CM | POA: Diagnosis not present

## 2023-03-29 DIAGNOSIS — R2681 Unsteadiness on feet: Secondary | ICD-10-CM | POA: Diagnosis not present

## 2023-03-29 DIAGNOSIS — R278 Other lack of coordination: Secondary | ICD-10-CM | POA: Diagnosis not present

## 2023-03-30 DIAGNOSIS — M6281 Muscle weakness (generalized): Secondary | ICD-10-CM | POA: Diagnosis not present

## 2023-03-30 DIAGNOSIS — I639 Cerebral infarction, unspecified: Secondary | ICD-10-CM | POA: Diagnosis not present

## 2023-03-30 DIAGNOSIS — R278 Other lack of coordination: Secondary | ICD-10-CM | POA: Diagnosis not present

## 2023-03-30 DIAGNOSIS — N3946 Mixed incontinence: Secondary | ICD-10-CM | POA: Diagnosis not present

## 2023-03-31 DIAGNOSIS — R278 Other lack of coordination: Secondary | ICD-10-CM | POA: Diagnosis not present

## 2023-03-31 DIAGNOSIS — M6281 Muscle weakness (generalized): Secondary | ICD-10-CM | POA: Diagnosis not present

## 2023-03-31 DIAGNOSIS — R41841 Cognitive communication deficit: Secondary | ICD-10-CM | POA: Diagnosis not present

## 2023-03-31 DIAGNOSIS — R4189 Other symptoms and signs involving cognitive functions and awareness: Secondary | ICD-10-CM | POA: Diagnosis not present

## 2023-03-31 DIAGNOSIS — G319 Degenerative disease of nervous system, unspecified: Secondary | ICD-10-CM | POA: Diagnosis not present

## 2023-03-31 DIAGNOSIS — I639 Cerebral infarction, unspecified: Secondary | ICD-10-CM | POA: Diagnosis not present

## 2023-03-31 DIAGNOSIS — N3946 Mixed incontinence: Secondary | ICD-10-CM | POA: Diagnosis not present

## 2023-04-01 DIAGNOSIS — I639 Cerebral infarction, unspecified: Secondary | ICD-10-CM | POA: Diagnosis not present

## 2023-04-01 DIAGNOSIS — R2681 Unsteadiness on feet: Secondary | ICD-10-CM | POA: Diagnosis not present

## 2023-04-01 DIAGNOSIS — M25561 Pain in right knee: Secondary | ICD-10-CM | POA: Diagnosis not present

## 2023-04-01 DIAGNOSIS — M6281 Muscle weakness (generalized): Secondary | ICD-10-CM | POA: Diagnosis not present

## 2023-04-04 DIAGNOSIS — I639 Cerebral infarction, unspecified: Secondary | ICD-10-CM | POA: Diagnosis not present

## 2023-04-04 DIAGNOSIS — M6281 Muscle weakness (generalized): Secondary | ICD-10-CM | POA: Diagnosis not present

## 2023-04-04 DIAGNOSIS — N3946 Mixed incontinence: Secondary | ICD-10-CM | POA: Diagnosis not present

## 2023-04-04 DIAGNOSIS — R278 Other lack of coordination: Secondary | ICD-10-CM | POA: Diagnosis not present

## 2023-04-04 DIAGNOSIS — R4189 Other symptoms and signs involving cognitive functions and awareness: Secondary | ICD-10-CM | POA: Diagnosis not present

## 2023-04-04 DIAGNOSIS — R41841 Cognitive communication deficit: Secondary | ICD-10-CM | POA: Diagnosis not present

## 2023-04-04 DIAGNOSIS — G319 Degenerative disease of nervous system, unspecified: Secondary | ICD-10-CM | POA: Diagnosis not present

## 2023-04-05 DIAGNOSIS — N3946 Mixed incontinence: Secondary | ICD-10-CM | POA: Diagnosis not present

## 2023-04-05 DIAGNOSIS — I639 Cerebral infarction, unspecified: Secondary | ICD-10-CM | POA: Diagnosis not present

## 2023-04-05 DIAGNOSIS — R278 Other lack of coordination: Secondary | ICD-10-CM | POA: Diagnosis not present

## 2023-04-05 DIAGNOSIS — M6281 Muscle weakness (generalized): Secondary | ICD-10-CM | POA: Diagnosis not present

## 2023-04-11 DIAGNOSIS — R4189 Other symptoms and signs involving cognitive functions and awareness: Secondary | ICD-10-CM | POA: Diagnosis not present

## 2023-04-11 DIAGNOSIS — R41841 Cognitive communication deficit: Secondary | ICD-10-CM | POA: Diagnosis not present

## 2023-04-11 DIAGNOSIS — G319 Degenerative disease of nervous system, unspecified: Secondary | ICD-10-CM | POA: Diagnosis not present

## 2023-04-11 DIAGNOSIS — I639 Cerebral infarction, unspecified: Secondary | ICD-10-CM | POA: Diagnosis not present

## 2023-04-12 DIAGNOSIS — N3946 Mixed incontinence: Secondary | ICD-10-CM | POA: Diagnosis not present

## 2023-04-12 DIAGNOSIS — R278 Other lack of coordination: Secondary | ICD-10-CM | POA: Diagnosis not present

## 2023-04-12 DIAGNOSIS — G319 Degenerative disease of nervous system, unspecified: Secondary | ICD-10-CM | POA: Diagnosis not present

## 2023-04-12 DIAGNOSIS — M6281 Muscle weakness (generalized): Secondary | ICD-10-CM | POA: Diagnosis not present

## 2023-04-12 DIAGNOSIS — R4189 Other symptoms and signs involving cognitive functions and awareness: Secondary | ICD-10-CM | POA: Diagnosis not present

## 2023-04-12 DIAGNOSIS — R41841 Cognitive communication deficit: Secondary | ICD-10-CM | POA: Diagnosis not present

## 2023-04-12 DIAGNOSIS — I639 Cerebral infarction, unspecified: Secondary | ICD-10-CM | POA: Diagnosis not present

## 2023-04-14 DIAGNOSIS — R41841 Cognitive communication deficit: Secondary | ICD-10-CM | POA: Diagnosis not present

## 2023-04-14 DIAGNOSIS — G319 Degenerative disease of nervous system, unspecified: Secondary | ICD-10-CM | POA: Diagnosis not present

## 2023-04-14 DIAGNOSIS — R4189 Other symptoms and signs involving cognitive functions and awareness: Secondary | ICD-10-CM | POA: Diagnosis not present

## 2023-04-14 DIAGNOSIS — I639 Cerebral infarction, unspecified: Secondary | ICD-10-CM | POA: Diagnosis not present

## 2023-04-18 DIAGNOSIS — I639 Cerebral infarction, unspecified: Secondary | ICD-10-CM | POA: Diagnosis not present

## 2023-04-18 DIAGNOSIS — G319 Degenerative disease of nervous system, unspecified: Secondary | ICD-10-CM | POA: Diagnosis not present

## 2023-04-18 DIAGNOSIS — R41841 Cognitive communication deficit: Secondary | ICD-10-CM | POA: Diagnosis not present

## 2023-04-18 DIAGNOSIS — R4189 Other symptoms and signs involving cognitive functions and awareness: Secondary | ICD-10-CM | POA: Diagnosis not present

## 2023-04-21 DIAGNOSIS — G319 Degenerative disease of nervous system, unspecified: Secondary | ICD-10-CM | POA: Diagnosis not present

## 2023-04-21 DIAGNOSIS — R41841 Cognitive communication deficit: Secondary | ICD-10-CM | POA: Diagnosis not present

## 2023-04-21 DIAGNOSIS — R4189 Other symptoms and signs involving cognitive functions and awareness: Secondary | ICD-10-CM | POA: Diagnosis not present

## 2023-04-21 DIAGNOSIS — I639 Cerebral infarction, unspecified: Secondary | ICD-10-CM | POA: Diagnosis not present

## 2023-04-25 ENCOUNTER — Ambulatory Visit: Payer: Medicare Other

## 2023-04-26 DIAGNOSIS — Z9181 History of falling: Secondary | ICD-10-CM | POA: Diagnosis not present

## 2023-04-26 DIAGNOSIS — G319 Degenerative disease of nervous system, unspecified: Secondary | ICD-10-CM | POA: Diagnosis not present

## 2023-04-26 DIAGNOSIS — Z79899 Other long term (current) drug therapy: Secondary | ICD-10-CM | POA: Diagnosis not present

## 2023-04-26 DIAGNOSIS — R41841 Cognitive communication deficit: Secondary | ICD-10-CM | POA: Diagnosis not present

## 2023-04-26 DIAGNOSIS — F5104 Psychophysiologic insomnia: Secondary | ICD-10-CM | POA: Diagnosis not present

## 2023-04-26 DIAGNOSIS — R4189 Other symptoms and signs involving cognitive functions and awareness: Secondary | ICD-10-CM | POA: Diagnosis not present

## 2023-04-26 DIAGNOSIS — F03A0 Unspecified dementia, mild, without behavioral disturbance, psychotic disturbance, mood disturbance, and anxiety: Secondary | ICD-10-CM | POA: Diagnosis not present

## 2023-04-26 DIAGNOSIS — R630 Anorexia: Secondary | ICD-10-CM | POA: Diagnosis not present

## 2023-04-26 DIAGNOSIS — I639 Cerebral infarction, unspecified: Secondary | ICD-10-CM | POA: Diagnosis not present

## 2023-04-26 DIAGNOSIS — F430 Acute stress reaction: Secondary | ICD-10-CM | POA: Diagnosis not present

## 2023-04-28 DIAGNOSIS — R41841 Cognitive communication deficit: Secondary | ICD-10-CM | POA: Diagnosis not present

## 2023-04-28 DIAGNOSIS — R4189 Other symptoms and signs involving cognitive functions and awareness: Secondary | ICD-10-CM | POA: Diagnosis not present

## 2023-04-28 DIAGNOSIS — G319 Degenerative disease of nervous system, unspecified: Secondary | ICD-10-CM | POA: Diagnosis not present

## 2023-04-28 DIAGNOSIS — I639 Cerebral infarction, unspecified: Secondary | ICD-10-CM | POA: Diagnosis not present

## 2023-05-06 DIAGNOSIS — I639 Cerebral infarction, unspecified: Secondary | ICD-10-CM | POA: Diagnosis not present

## 2023-05-06 DIAGNOSIS — R4189 Other symptoms and signs involving cognitive functions and awareness: Secondary | ICD-10-CM | POA: Diagnosis not present

## 2023-05-06 DIAGNOSIS — G319 Degenerative disease of nervous system, unspecified: Secondary | ICD-10-CM | POA: Diagnosis not present

## 2023-05-06 DIAGNOSIS — R41841 Cognitive communication deficit: Secondary | ICD-10-CM | POA: Diagnosis not present

## 2023-05-12 DIAGNOSIS — R4189 Other symptoms and signs involving cognitive functions and awareness: Secondary | ICD-10-CM | POA: Diagnosis not present

## 2023-05-12 DIAGNOSIS — G319 Degenerative disease of nervous system, unspecified: Secondary | ICD-10-CM | POA: Diagnosis not present

## 2023-05-12 DIAGNOSIS — I639 Cerebral infarction, unspecified: Secondary | ICD-10-CM | POA: Diagnosis not present

## 2023-05-12 DIAGNOSIS — R41841 Cognitive communication deficit: Secondary | ICD-10-CM | POA: Diagnosis not present

## 2023-05-13 DIAGNOSIS — R41841 Cognitive communication deficit: Secondary | ICD-10-CM | POA: Diagnosis not present

## 2023-05-13 DIAGNOSIS — G319 Degenerative disease of nervous system, unspecified: Secondary | ICD-10-CM | POA: Diagnosis not present

## 2023-05-13 DIAGNOSIS — R4189 Other symptoms and signs involving cognitive functions and awareness: Secondary | ICD-10-CM | POA: Diagnosis not present

## 2023-05-13 DIAGNOSIS — I639 Cerebral infarction, unspecified: Secondary | ICD-10-CM | POA: Diagnosis not present

## 2023-05-16 DIAGNOSIS — I639 Cerebral infarction, unspecified: Secondary | ICD-10-CM | POA: Diagnosis not present

## 2023-05-16 DIAGNOSIS — R41841 Cognitive communication deficit: Secondary | ICD-10-CM | POA: Diagnosis not present

## 2023-05-16 DIAGNOSIS — R4189 Other symptoms and signs involving cognitive functions and awareness: Secondary | ICD-10-CM | POA: Diagnosis not present

## 2023-05-16 DIAGNOSIS — G319 Degenerative disease of nervous system, unspecified: Secondary | ICD-10-CM | POA: Diagnosis not present

## 2023-05-17 DIAGNOSIS — I639 Cerebral infarction, unspecified: Secondary | ICD-10-CM | POA: Diagnosis not present

## 2023-05-17 DIAGNOSIS — I10 Essential (primary) hypertension: Secondary | ICD-10-CM | POA: Diagnosis not present

## 2023-05-17 DIAGNOSIS — R41841 Cognitive communication deficit: Secondary | ICD-10-CM | POA: Diagnosis not present

## 2023-05-17 DIAGNOSIS — R4189 Other symptoms and signs involving cognitive functions and awareness: Secondary | ICD-10-CM | POA: Diagnosis not present

## 2023-05-17 DIAGNOSIS — E785 Hyperlipidemia, unspecified: Secondary | ICD-10-CM | POA: Diagnosis not present

## 2023-05-17 DIAGNOSIS — G309 Alzheimer's disease, unspecified: Secondary | ICD-10-CM | POA: Diagnosis not present

## 2023-05-17 DIAGNOSIS — G319 Degenerative disease of nervous system, unspecified: Secondary | ICD-10-CM | POA: Diagnosis not present

## 2023-05-19 DIAGNOSIS — R41841 Cognitive communication deficit: Secondary | ICD-10-CM | POA: Diagnosis not present

## 2023-05-19 DIAGNOSIS — I639 Cerebral infarction, unspecified: Secondary | ICD-10-CM | POA: Diagnosis not present

## 2023-05-19 DIAGNOSIS — R4189 Other symptoms and signs involving cognitive functions and awareness: Secondary | ICD-10-CM | POA: Diagnosis not present

## 2023-05-19 DIAGNOSIS — G319 Degenerative disease of nervous system, unspecified: Secondary | ICD-10-CM | POA: Diagnosis not present

## 2023-05-23 DIAGNOSIS — R4189 Other symptoms and signs involving cognitive functions and awareness: Secondary | ICD-10-CM | POA: Diagnosis not present

## 2023-05-23 DIAGNOSIS — I639 Cerebral infarction, unspecified: Secondary | ICD-10-CM | POA: Diagnosis not present

## 2023-05-23 DIAGNOSIS — R41841 Cognitive communication deficit: Secondary | ICD-10-CM | POA: Diagnosis not present

## 2023-05-23 DIAGNOSIS — G319 Degenerative disease of nervous system, unspecified: Secondary | ICD-10-CM | POA: Diagnosis not present

## 2023-05-30 ENCOUNTER — Ambulatory Visit: Payer: Medicare Other

## 2023-05-31 DIAGNOSIS — R4189 Other symptoms and signs involving cognitive functions and awareness: Secondary | ICD-10-CM | POA: Diagnosis not present

## 2023-05-31 DIAGNOSIS — R41841 Cognitive communication deficit: Secondary | ICD-10-CM | POA: Diagnosis not present

## 2023-05-31 DIAGNOSIS — I639 Cerebral infarction, unspecified: Secondary | ICD-10-CM | POA: Diagnosis not present

## 2023-05-31 DIAGNOSIS — G319 Degenerative disease of nervous system, unspecified: Secondary | ICD-10-CM | POA: Diagnosis not present

## 2023-06-01 DIAGNOSIS — F5104 Psychophysiologic insomnia: Secondary | ICD-10-CM | POA: Diagnosis not present

## 2023-06-01 DIAGNOSIS — Z79899 Other long term (current) drug therapy: Secondary | ICD-10-CM | POA: Diagnosis not present

## 2023-06-01 DIAGNOSIS — Z9181 History of falling: Secondary | ICD-10-CM | POA: Diagnosis not present

## 2023-06-01 DIAGNOSIS — F03A0 Unspecified dementia, mild, without behavioral disturbance, psychotic disturbance, mood disturbance, and anxiety: Secondary | ICD-10-CM | POA: Diagnosis not present

## 2023-06-01 DIAGNOSIS — R630 Anorexia: Secondary | ICD-10-CM | POA: Diagnosis not present

## 2023-06-01 DIAGNOSIS — F430 Acute stress reaction: Secondary | ICD-10-CM | POA: Diagnosis not present

## 2023-06-02 DIAGNOSIS — E785 Hyperlipidemia, unspecified: Secondary | ICD-10-CM | POA: Diagnosis not present

## 2023-06-02 DIAGNOSIS — R4189 Other symptoms and signs involving cognitive functions and awareness: Secondary | ICD-10-CM | POA: Diagnosis not present

## 2023-06-02 DIAGNOSIS — E559 Vitamin D deficiency, unspecified: Secondary | ICD-10-CM | POA: Diagnosis not present

## 2023-06-02 DIAGNOSIS — R41841 Cognitive communication deficit: Secondary | ICD-10-CM | POA: Diagnosis not present

## 2023-06-02 DIAGNOSIS — I10 Essential (primary) hypertension: Secondary | ICD-10-CM | POA: Diagnosis not present

## 2023-06-02 DIAGNOSIS — I639 Cerebral infarction, unspecified: Secondary | ICD-10-CM | POA: Diagnosis not present

## 2023-06-02 DIAGNOSIS — G319 Degenerative disease of nervous system, unspecified: Secondary | ICD-10-CM | POA: Diagnosis not present

## 2023-06-06 DIAGNOSIS — I639 Cerebral infarction, unspecified: Secondary | ICD-10-CM | POA: Diagnosis not present

## 2023-06-06 DIAGNOSIS — R41841 Cognitive communication deficit: Secondary | ICD-10-CM | POA: Diagnosis not present

## 2023-06-06 DIAGNOSIS — G319 Degenerative disease of nervous system, unspecified: Secondary | ICD-10-CM | POA: Diagnosis not present

## 2023-06-06 DIAGNOSIS — R4189 Other symptoms and signs involving cognitive functions and awareness: Secondary | ICD-10-CM | POA: Diagnosis not present

## 2023-06-07 DIAGNOSIS — R41841 Cognitive communication deficit: Secondary | ICD-10-CM | POA: Diagnosis not present

## 2023-06-07 DIAGNOSIS — I639 Cerebral infarction, unspecified: Secondary | ICD-10-CM | POA: Diagnosis not present

## 2023-06-07 DIAGNOSIS — R4189 Other symptoms and signs involving cognitive functions and awareness: Secondary | ICD-10-CM | POA: Diagnosis not present

## 2023-06-07 DIAGNOSIS — G319 Degenerative disease of nervous system, unspecified: Secondary | ICD-10-CM | POA: Diagnosis not present

## 2023-06-10 DIAGNOSIS — F03A0 Unspecified dementia, mild, without behavioral disturbance, psychotic disturbance, mood disturbance, and anxiety: Secondary | ICD-10-CM | POA: Diagnosis not present

## 2023-06-10 DIAGNOSIS — Z79899 Other long term (current) drug therapy: Secondary | ICD-10-CM | POA: Diagnosis not present

## 2023-06-10 DIAGNOSIS — F430 Acute stress reaction: Secondary | ICD-10-CM | POA: Diagnosis not present

## 2023-06-10 DIAGNOSIS — F5104 Psychophysiologic insomnia: Secondary | ICD-10-CM | POA: Diagnosis not present

## 2023-06-10 DIAGNOSIS — R630 Anorexia: Secondary | ICD-10-CM | POA: Diagnosis not present

## 2023-06-10 DIAGNOSIS — Z9181 History of falling: Secondary | ICD-10-CM | POA: Diagnosis not present

## 2023-06-29 DIAGNOSIS — F5104 Psychophysiologic insomnia: Secondary | ICD-10-CM | POA: Diagnosis not present

## 2023-06-29 DIAGNOSIS — F03A0 Unspecified dementia, mild, without behavioral disturbance, psychotic disturbance, mood disturbance, and anxiety: Secondary | ICD-10-CM | POA: Diagnosis not present

## 2023-06-29 DIAGNOSIS — Z9181 History of falling: Secondary | ICD-10-CM | POA: Diagnosis not present

## 2023-06-29 DIAGNOSIS — F430 Acute stress reaction: Secondary | ICD-10-CM | POA: Diagnosis not present

## 2023-06-29 DIAGNOSIS — R630 Anorexia: Secondary | ICD-10-CM | POA: Diagnosis not present

## 2023-06-29 DIAGNOSIS — Z79899 Other long term (current) drug therapy: Secondary | ICD-10-CM | POA: Diagnosis not present

## 2023-07-01 DIAGNOSIS — R3915 Urgency of urination: Secondary | ICD-10-CM | POA: Diagnosis not present

## 2023-07-01 DIAGNOSIS — R350 Frequency of micturition: Secondary | ICD-10-CM | POA: Diagnosis not present

## 2023-07-01 DIAGNOSIS — R4182 Altered mental status, unspecified: Secondary | ICD-10-CM | POA: Diagnosis not present

## 2023-07-04 ENCOUNTER — Ambulatory Visit: Payer: Medicare Other

## 2023-07-05 DIAGNOSIS — G309 Alzheimer's disease, unspecified: Secondary | ICD-10-CM | POA: Diagnosis not present

## 2023-07-05 DIAGNOSIS — I10 Essential (primary) hypertension: Secondary | ICD-10-CM | POA: Diagnosis not present

## 2023-07-05 DIAGNOSIS — F331 Major depressive disorder, recurrent, moderate: Secondary | ICD-10-CM | POA: Diagnosis not present

## 2023-07-06 DIAGNOSIS — R630 Anorexia: Secondary | ICD-10-CM | POA: Diagnosis not present

## 2023-07-06 DIAGNOSIS — Z79899 Other long term (current) drug therapy: Secondary | ICD-10-CM | POA: Diagnosis not present

## 2023-07-06 DIAGNOSIS — F5104 Psychophysiologic insomnia: Secondary | ICD-10-CM | POA: Diagnosis not present

## 2023-07-06 DIAGNOSIS — Z9181 History of falling: Secondary | ICD-10-CM | POA: Diagnosis not present

## 2023-07-06 DIAGNOSIS — F430 Acute stress reaction: Secondary | ICD-10-CM | POA: Diagnosis not present

## 2023-07-06 DIAGNOSIS — F03A0 Unspecified dementia, mild, without behavioral disturbance, psychotic disturbance, mood disturbance, and anxiety: Secondary | ICD-10-CM | POA: Diagnosis not present

## 2023-07-08 DIAGNOSIS — F430 Acute stress reaction: Secondary | ICD-10-CM | POA: Diagnosis not present

## 2023-07-08 DIAGNOSIS — F03A0 Unspecified dementia, mild, without behavioral disturbance, psychotic disturbance, mood disturbance, and anxiety: Secondary | ICD-10-CM | POA: Diagnosis not present

## 2023-07-08 DIAGNOSIS — R630 Anorexia: Secondary | ICD-10-CM | POA: Diagnosis not present

## 2023-07-08 DIAGNOSIS — F5104 Psychophysiologic insomnia: Secondary | ICD-10-CM | POA: Diagnosis not present

## 2023-07-08 DIAGNOSIS — Z9181 History of falling: Secondary | ICD-10-CM | POA: Diagnosis not present

## 2023-07-08 DIAGNOSIS — Z79899 Other long term (current) drug therapy: Secondary | ICD-10-CM | POA: Diagnosis not present

## 2023-07-27 DIAGNOSIS — Z9181 History of falling: Secondary | ICD-10-CM | POA: Diagnosis not present

## 2023-07-27 DIAGNOSIS — R630 Anorexia: Secondary | ICD-10-CM | POA: Diagnosis not present

## 2023-07-27 DIAGNOSIS — F430 Acute stress reaction: Secondary | ICD-10-CM | POA: Diagnosis not present

## 2023-07-27 DIAGNOSIS — Z79899 Other long term (current) drug therapy: Secondary | ICD-10-CM | POA: Diagnosis not present

## 2023-07-27 DIAGNOSIS — F03A0 Unspecified dementia, mild, without behavioral disturbance, psychotic disturbance, mood disturbance, and anxiety: Secondary | ICD-10-CM | POA: Diagnosis not present

## 2023-07-27 DIAGNOSIS — F5104 Psychophysiologic insomnia: Secondary | ICD-10-CM | POA: Diagnosis not present

## 2023-08-08 ENCOUNTER — Ambulatory Visit: Payer: Medicare Other

## 2023-08-08 DIAGNOSIS — Z9181 History of falling: Secondary | ICD-10-CM | POA: Diagnosis not present

## 2023-08-08 DIAGNOSIS — Z79899 Other long term (current) drug therapy: Secondary | ICD-10-CM | POA: Diagnosis not present

## 2023-08-08 DIAGNOSIS — R630 Anorexia: Secondary | ICD-10-CM | POA: Diagnosis not present

## 2023-08-08 DIAGNOSIS — F430 Acute stress reaction: Secondary | ICD-10-CM | POA: Diagnosis not present

## 2023-08-08 DIAGNOSIS — F5104 Psychophysiologic insomnia: Secondary | ICD-10-CM | POA: Diagnosis not present

## 2023-08-08 DIAGNOSIS — F03A0 Unspecified dementia, mild, without behavioral disturbance, psychotic disturbance, mood disturbance, and anxiety: Secondary | ICD-10-CM | POA: Diagnosis not present

## 2023-08-09 DIAGNOSIS — F331 Major depressive disorder, recurrent, moderate: Secondary | ICD-10-CM | POA: Diagnosis not present

## 2023-08-09 DIAGNOSIS — M8580 Other specified disorders of bone density and structure, unspecified site: Secondary | ICD-10-CM | POA: Diagnosis not present

## 2023-08-09 DIAGNOSIS — G309 Alzheimer's disease, unspecified: Secondary | ICD-10-CM | POA: Diagnosis not present

## 2023-08-09 DIAGNOSIS — I10 Essential (primary) hypertension: Secondary | ICD-10-CM | POA: Diagnosis not present

## 2023-08-24 DIAGNOSIS — R630 Anorexia: Secondary | ICD-10-CM | POA: Diagnosis not present

## 2023-08-24 DIAGNOSIS — F03A0 Unspecified dementia, mild, without behavioral disturbance, psychotic disturbance, mood disturbance, and anxiety: Secondary | ICD-10-CM | POA: Diagnosis not present

## 2023-08-24 DIAGNOSIS — Z79899 Other long term (current) drug therapy: Secondary | ICD-10-CM | POA: Diagnosis not present

## 2023-08-24 DIAGNOSIS — Z9181 History of falling: Secondary | ICD-10-CM | POA: Diagnosis not present

## 2023-08-24 DIAGNOSIS — F5104 Psychophysiologic insomnia: Secondary | ICD-10-CM | POA: Diagnosis not present

## 2023-08-24 DIAGNOSIS — F430 Acute stress reaction: Secondary | ICD-10-CM | POA: Diagnosis not present

## 2023-09-07 DIAGNOSIS — Z79899 Other long term (current) drug therapy: Secondary | ICD-10-CM | POA: Diagnosis not present

## 2023-09-07 DIAGNOSIS — F03A0 Unspecified dementia, mild, without behavioral disturbance, psychotic disturbance, mood disturbance, and anxiety: Secondary | ICD-10-CM | POA: Diagnosis not present

## 2023-09-07 DIAGNOSIS — F430 Acute stress reaction: Secondary | ICD-10-CM | POA: Diagnosis not present

## 2023-09-07 DIAGNOSIS — R630 Anorexia: Secondary | ICD-10-CM | POA: Diagnosis not present

## 2023-09-07 DIAGNOSIS — Z9181 History of falling: Secondary | ICD-10-CM | POA: Diagnosis not present

## 2023-09-07 DIAGNOSIS — F5104 Psychophysiologic insomnia: Secondary | ICD-10-CM | POA: Diagnosis not present

## 2023-09-12 ENCOUNTER — Ambulatory Visit: Payer: Medicare Other

## 2023-09-21 DIAGNOSIS — R630 Anorexia: Secondary | ICD-10-CM | POA: Diagnosis not present

## 2023-09-21 DIAGNOSIS — F03A0 Unspecified dementia, mild, without behavioral disturbance, psychotic disturbance, mood disturbance, and anxiety: Secondary | ICD-10-CM | POA: Diagnosis not present

## 2023-09-21 DIAGNOSIS — Z9181 History of falling: Secondary | ICD-10-CM | POA: Diagnosis not present

## 2023-09-21 DIAGNOSIS — F5104 Psychophysiologic insomnia: Secondary | ICD-10-CM | POA: Diagnosis not present

## 2023-09-21 DIAGNOSIS — F430 Acute stress reaction: Secondary | ICD-10-CM | POA: Diagnosis not present

## 2023-09-21 DIAGNOSIS — Z79899 Other long term (current) drug therapy: Secondary | ICD-10-CM | POA: Diagnosis not present

## 2023-10-05 DIAGNOSIS — Z9181 History of falling: Secondary | ICD-10-CM | POA: Diagnosis not present

## 2023-10-05 DIAGNOSIS — Z79899 Other long term (current) drug therapy: Secondary | ICD-10-CM | POA: Diagnosis not present

## 2023-10-05 DIAGNOSIS — F03A0 Unspecified dementia, mild, without behavioral disturbance, psychotic disturbance, mood disturbance, and anxiety: Secondary | ICD-10-CM | POA: Diagnosis not present

## 2023-10-05 DIAGNOSIS — F5104 Psychophysiologic insomnia: Secondary | ICD-10-CM | POA: Diagnosis not present

## 2023-10-05 DIAGNOSIS — R630 Anorexia: Secondary | ICD-10-CM | POA: Diagnosis not present

## 2023-10-05 DIAGNOSIS — F430 Acute stress reaction: Secondary | ICD-10-CM | POA: Diagnosis not present

## 2023-10-17 ENCOUNTER — Ambulatory Visit: Payer: Medicare Other

## 2023-10-19 DIAGNOSIS — F430 Acute stress reaction: Secondary | ICD-10-CM | POA: Diagnosis not present

## 2023-10-19 DIAGNOSIS — F03A0 Unspecified dementia, mild, without behavioral disturbance, psychotic disturbance, mood disturbance, and anxiety: Secondary | ICD-10-CM | POA: Diagnosis not present

## 2023-10-19 DIAGNOSIS — Z9181 History of falling: Secondary | ICD-10-CM | POA: Diagnosis not present

## 2023-10-19 DIAGNOSIS — R630 Anorexia: Secondary | ICD-10-CM | POA: Diagnosis not present

## 2023-10-19 DIAGNOSIS — F5104 Psychophysiologic insomnia: Secondary | ICD-10-CM | POA: Diagnosis not present

## 2023-10-19 DIAGNOSIS — Z79899 Other long term (current) drug therapy: Secondary | ICD-10-CM | POA: Diagnosis not present

## 2023-11-07 DIAGNOSIS — F5104 Psychophysiologic insomnia: Secondary | ICD-10-CM | POA: Diagnosis not present

## 2023-11-07 DIAGNOSIS — R630 Anorexia: Secondary | ICD-10-CM | POA: Diagnosis not present

## 2023-11-07 DIAGNOSIS — Z9181 History of falling: Secondary | ICD-10-CM | POA: Diagnosis not present

## 2023-11-07 DIAGNOSIS — F430 Acute stress reaction: Secondary | ICD-10-CM | POA: Diagnosis not present

## 2023-11-07 DIAGNOSIS — Z79899 Other long term (current) drug therapy: Secondary | ICD-10-CM | POA: Diagnosis not present

## 2023-11-07 DIAGNOSIS — F03A0 Unspecified dementia, mild, without behavioral disturbance, psychotic disturbance, mood disturbance, and anxiety: Secondary | ICD-10-CM | POA: Diagnosis not present

## 2023-11-15 DIAGNOSIS — G309 Alzheimer's disease, unspecified: Secondary | ICD-10-CM | POA: Diagnosis not present

## 2023-11-15 DIAGNOSIS — I10 Essential (primary) hypertension: Secondary | ICD-10-CM | POA: Diagnosis not present

## 2023-11-15 DIAGNOSIS — E785 Hyperlipidemia, unspecified: Secondary | ICD-10-CM | POA: Diagnosis not present

## 2023-11-15 DIAGNOSIS — M8580 Other specified disorders of bone density and structure, unspecified site: Secondary | ICD-10-CM | POA: Diagnosis not present

## 2023-11-15 DIAGNOSIS — F331 Major depressive disorder, recurrent, moderate: Secondary | ICD-10-CM | POA: Diagnosis not present

## 2023-11-15 DIAGNOSIS — I639 Cerebral infarction, unspecified: Secondary | ICD-10-CM | POA: Diagnosis not present

## 2023-11-17 ENCOUNTER — Ambulatory Visit

## 2023-11-21 ENCOUNTER — Ambulatory Visit: Payer: Medicare Other

## 2023-11-23 DIAGNOSIS — F03A0 Unspecified dementia, mild, without behavioral disturbance, psychotic disturbance, mood disturbance, and anxiety: Secondary | ICD-10-CM | POA: Diagnosis not present

## 2023-11-23 DIAGNOSIS — F5104 Psychophysiologic insomnia: Secondary | ICD-10-CM | POA: Diagnosis not present

## 2023-11-23 DIAGNOSIS — Z9181 History of falling: Secondary | ICD-10-CM | POA: Diagnosis not present

## 2023-11-23 DIAGNOSIS — F430 Acute stress reaction: Secondary | ICD-10-CM | POA: Diagnosis not present

## 2023-11-23 DIAGNOSIS — Z79899 Other long term (current) drug therapy: Secondary | ICD-10-CM | POA: Diagnosis not present

## 2023-11-23 DIAGNOSIS — R630 Anorexia: Secondary | ICD-10-CM | POA: Diagnosis not present

## 2023-11-24 DIAGNOSIS — F331 Major depressive disorder, recurrent, moderate: Secondary | ICD-10-CM | POA: Diagnosis not present

## 2023-11-24 DIAGNOSIS — E785 Hyperlipidemia, unspecified: Secondary | ICD-10-CM | POA: Diagnosis not present

## 2023-11-24 DIAGNOSIS — E559 Vitamin D deficiency, unspecified: Secondary | ICD-10-CM | POA: Diagnosis not present

## 2023-11-24 DIAGNOSIS — I10 Essential (primary) hypertension: Secondary | ICD-10-CM | POA: Diagnosis not present

## 2023-11-24 DIAGNOSIS — G309 Alzheimer's disease, unspecified: Secondary | ICD-10-CM | POA: Diagnosis not present

## 2023-11-25 DIAGNOSIS — E559 Vitamin D deficiency, unspecified: Secondary | ICD-10-CM | POA: Diagnosis not present

## 2023-11-25 DIAGNOSIS — R4182 Altered mental status, unspecified: Secondary | ICD-10-CM | POA: Diagnosis not present

## 2023-12-05 DIAGNOSIS — G309 Alzheimer's disease, unspecified: Secondary | ICD-10-CM | POA: Diagnosis not present

## 2023-12-05 DIAGNOSIS — R2681 Unsteadiness on feet: Secondary | ICD-10-CM | POA: Diagnosis not present

## 2023-12-05 DIAGNOSIS — M6281 Muscle weakness (generalized): Secondary | ICD-10-CM | POA: Diagnosis not present

## 2023-12-06 DIAGNOSIS — I10 Essential (primary) hypertension: Secondary | ICD-10-CM | POA: Diagnosis not present

## 2023-12-06 DIAGNOSIS — F331 Major depressive disorder, recurrent, moderate: Secondary | ICD-10-CM | POA: Diagnosis not present

## 2023-12-06 DIAGNOSIS — N390 Urinary tract infection, site not specified: Secondary | ICD-10-CM | POA: Diagnosis not present

## 2023-12-06 DIAGNOSIS — G309 Alzheimer's disease, unspecified: Secondary | ICD-10-CM | POA: Diagnosis not present

## 2023-12-07 DIAGNOSIS — M6281 Muscle weakness (generalized): Secondary | ICD-10-CM | POA: Diagnosis not present

## 2023-12-07 DIAGNOSIS — R2681 Unsteadiness on feet: Secondary | ICD-10-CM | POA: Diagnosis not present

## 2023-12-07 DIAGNOSIS — G309 Alzheimer's disease, unspecified: Secondary | ICD-10-CM | POA: Diagnosis not present

## 2023-12-09 DIAGNOSIS — G309 Alzheimer's disease, unspecified: Secondary | ICD-10-CM | POA: Diagnosis not present

## 2023-12-09 DIAGNOSIS — M6281 Muscle weakness (generalized): Secondary | ICD-10-CM | POA: Diagnosis not present

## 2023-12-09 DIAGNOSIS — R2681 Unsteadiness on feet: Secondary | ICD-10-CM | POA: Diagnosis not present

## 2023-12-12 DIAGNOSIS — G309 Alzheimer's disease, unspecified: Secondary | ICD-10-CM | POA: Diagnosis not present

## 2023-12-12 DIAGNOSIS — M6281 Muscle weakness (generalized): Secondary | ICD-10-CM | POA: Diagnosis not present

## 2023-12-12 DIAGNOSIS — R2681 Unsteadiness on feet: Secondary | ICD-10-CM | POA: Diagnosis not present

## 2023-12-15 DIAGNOSIS — M6281 Muscle weakness (generalized): Secondary | ICD-10-CM | POA: Diagnosis not present

## 2023-12-15 DIAGNOSIS — G309 Alzheimer's disease, unspecified: Secondary | ICD-10-CM | POA: Diagnosis not present

## 2023-12-15 DIAGNOSIS — R2681 Unsteadiness on feet: Secondary | ICD-10-CM | POA: Diagnosis not present

## 2023-12-16 DIAGNOSIS — G309 Alzheimer's disease, unspecified: Secondary | ICD-10-CM | POA: Diagnosis not present

## 2023-12-16 DIAGNOSIS — M6281 Muscle weakness (generalized): Secondary | ICD-10-CM | POA: Diagnosis not present

## 2023-12-16 DIAGNOSIS — R2681 Unsteadiness on feet: Secondary | ICD-10-CM | POA: Diagnosis not present

## 2023-12-18 DIAGNOSIS — F03A0 Unspecified dementia, mild, without behavioral disturbance, psychotic disturbance, mood disturbance, and anxiety: Secondary | ICD-10-CM | POA: Diagnosis not present

## 2023-12-18 DIAGNOSIS — Z9181 History of falling: Secondary | ICD-10-CM | POA: Diagnosis not present

## 2023-12-18 DIAGNOSIS — Z79899 Other long term (current) drug therapy: Secondary | ICD-10-CM | POA: Diagnosis not present

## 2023-12-18 DIAGNOSIS — F5104 Psychophysiologic insomnia: Secondary | ICD-10-CM | POA: Diagnosis not present

## 2023-12-18 DIAGNOSIS — R630 Anorexia: Secondary | ICD-10-CM | POA: Diagnosis not present

## 2023-12-18 DIAGNOSIS — F430 Acute stress reaction: Secondary | ICD-10-CM | POA: Diagnosis not present

## 2023-12-19 ENCOUNTER — Ambulatory Visit

## 2023-12-19 DIAGNOSIS — S199XXA Unspecified injury of neck, initial encounter: Secondary | ICD-10-CM | POA: Diagnosis not present

## 2023-12-19 DIAGNOSIS — Z9181 History of falling: Secondary | ICD-10-CM | POA: Diagnosis not present

## 2023-12-19 DIAGNOSIS — N3000 Acute cystitis without hematuria: Secondary | ICD-10-CM | POA: Diagnosis not present

## 2023-12-19 DIAGNOSIS — R2681 Unsteadiness on feet: Secondary | ICD-10-CM | POA: Diagnosis not present

## 2023-12-19 DIAGNOSIS — G309 Alzheimer's disease, unspecified: Secondary | ICD-10-CM | POA: Diagnosis not present

## 2023-12-19 DIAGNOSIS — S2239XA Fracture of one rib, unspecified side, initial encounter for closed fracture: Secondary | ICD-10-CM | POA: Diagnosis not present

## 2023-12-19 DIAGNOSIS — F419 Anxiety disorder, unspecified: Secondary | ICD-10-CM | POA: Diagnosis not present

## 2023-12-19 DIAGNOSIS — R451 Restlessness and agitation: Secondary | ICD-10-CM | POA: Diagnosis not present

## 2023-12-19 DIAGNOSIS — E785 Hyperlipidemia, unspecified: Secondary | ICD-10-CM | POA: Diagnosis not present

## 2023-12-19 DIAGNOSIS — S2232XA Fracture of one rib, left side, initial encounter for closed fracture: Secondary | ICD-10-CM | POA: Diagnosis not present

## 2023-12-19 DIAGNOSIS — Z9842 Cataract extraction status, left eye: Secondary | ICD-10-CM | POA: Diagnosis not present

## 2023-12-19 DIAGNOSIS — F32A Depression, unspecified: Secondary | ICD-10-CM | POA: Diagnosis not present

## 2023-12-19 DIAGNOSIS — F02811 Dementia in other diseases classified elsewhere, unspecified severity, with agitation: Secondary | ICD-10-CM | POA: Diagnosis not present

## 2023-12-19 DIAGNOSIS — Z7902 Long term (current) use of antithrombotics/antiplatelets: Secondary | ICD-10-CM | POA: Diagnosis not present

## 2023-12-19 DIAGNOSIS — Z7983 Long term (current) use of bisphosphonates: Secondary | ICD-10-CM | POA: Diagnosis not present

## 2023-12-19 DIAGNOSIS — R410 Disorientation, unspecified: Secondary | ICD-10-CM | POA: Diagnosis not present

## 2023-12-19 DIAGNOSIS — R531 Weakness: Secondary | ICD-10-CM | POA: Diagnosis not present

## 2023-12-19 DIAGNOSIS — Z961 Presence of intraocular lens: Secondary | ICD-10-CM | POA: Diagnosis not present

## 2023-12-19 DIAGNOSIS — N390 Urinary tract infection, site not specified: Secondary | ICD-10-CM | POA: Diagnosis not present

## 2023-12-19 DIAGNOSIS — Z8673 Personal history of transient ischemic attack (TIA), and cerebral infarction without residual deficits: Secondary | ICD-10-CM | POA: Diagnosis not present

## 2023-12-19 DIAGNOSIS — R4182 Altered mental status, unspecified: Secondary | ICD-10-CM | POA: Diagnosis not present

## 2023-12-19 DIAGNOSIS — F0284 Dementia in other diseases classified elsewhere, unspecified severity, with anxiety: Secondary | ICD-10-CM | POA: Diagnosis not present

## 2023-12-19 DIAGNOSIS — M6281 Muscle weakness (generalized): Secondary | ICD-10-CM | POA: Diagnosis not present

## 2023-12-19 DIAGNOSIS — G928 Other toxic encephalopathy: Secondary | ICD-10-CM | POA: Diagnosis not present

## 2023-12-19 DIAGNOSIS — Z9841 Cataract extraction status, right eye: Secondary | ICD-10-CM | POA: Diagnosis not present

## 2023-12-20 DIAGNOSIS — N3000 Acute cystitis without hematuria: Secondary | ICD-10-CM | POA: Diagnosis not present

## 2023-12-20 DIAGNOSIS — R410 Disorientation, unspecified: Secondary | ICD-10-CM | POA: Diagnosis not present

## 2023-12-20 DIAGNOSIS — F419 Anxiety disorder, unspecified: Secondary | ICD-10-CM | POA: Diagnosis not present

## 2023-12-20 DIAGNOSIS — R531 Weakness: Secondary | ICD-10-CM | POA: Diagnosis not present

## 2023-12-21 DIAGNOSIS — N3000 Acute cystitis without hematuria: Secondary | ICD-10-CM | POA: Diagnosis not present

## 2023-12-21 DIAGNOSIS — R4182 Altered mental status, unspecified: Secondary | ICD-10-CM | POA: Diagnosis not present

## 2023-12-22 DIAGNOSIS — R531 Weakness: Secondary | ICD-10-CM | POA: Diagnosis not present

## 2023-12-23 DIAGNOSIS — G309 Alzheimer's disease, unspecified: Secondary | ICD-10-CM | POA: Diagnosis not present

## 2023-12-23 DIAGNOSIS — R531 Weakness: Secondary | ICD-10-CM | POA: Diagnosis not present

## 2023-12-23 DIAGNOSIS — R2681 Unsteadiness on feet: Secondary | ICD-10-CM | POA: Diagnosis not present

## 2023-12-23 DIAGNOSIS — M6281 Muscle weakness (generalized): Secondary | ICD-10-CM | POA: Diagnosis not present

## 2023-12-26 ENCOUNTER — Ambulatory Visit: Payer: Medicare Other

## 2023-12-26 DIAGNOSIS — R2681 Unsteadiness on feet: Secondary | ICD-10-CM | POA: Diagnosis not present

## 2023-12-26 DIAGNOSIS — G309 Alzheimer's disease, unspecified: Secondary | ICD-10-CM | POA: Diagnosis not present

## 2023-12-26 DIAGNOSIS — M6281 Muscle weakness (generalized): Secondary | ICD-10-CM | POA: Diagnosis not present

## 2023-12-27 DIAGNOSIS — R2681 Unsteadiness on feet: Secondary | ICD-10-CM | POA: Diagnosis not present

## 2023-12-27 DIAGNOSIS — I10 Essential (primary) hypertension: Secondary | ICD-10-CM | POA: Diagnosis not present

## 2023-12-27 DIAGNOSIS — M1712 Unilateral primary osteoarthritis, left knee: Secondary | ICD-10-CM | POA: Diagnosis not present

## 2023-12-27 DIAGNOSIS — G309 Alzheimer's disease, unspecified: Secondary | ICD-10-CM | POA: Diagnosis not present

## 2023-12-27 DIAGNOSIS — M6281 Muscle weakness (generalized): Secondary | ICD-10-CM | POA: Diagnosis not present

## 2023-12-27 DIAGNOSIS — F331 Major depressive disorder, recurrent, moderate: Secondary | ICD-10-CM | POA: Diagnosis not present

## 2023-12-27 DIAGNOSIS — K5900 Constipation, unspecified: Secondary | ICD-10-CM | POA: Diagnosis not present

## 2023-12-28 DIAGNOSIS — R2681 Unsteadiness on feet: Secondary | ICD-10-CM | POA: Diagnosis not present

## 2023-12-28 DIAGNOSIS — M6281 Muscle weakness (generalized): Secondary | ICD-10-CM | POA: Diagnosis not present

## 2023-12-28 DIAGNOSIS — G309 Alzheimer's disease, unspecified: Secondary | ICD-10-CM | POA: Diagnosis not present

## 2023-12-29 DIAGNOSIS — R41841 Cognitive communication deficit: Secondary | ICD-10-CM | POA: Diagnosis not present

## 2023-12-29 DIAGNOSIS — R4189 Other symptoms and signs involving cognitive functions and awareness: Secondary | ICD-10-CM | POA: Diagnosis not present

## 2023-12-29 DIAGNOSIS — R4182 Altered mental status, unspecified: Secondary | ICD-10-CM | POA: Diagnosis not present

## 2023-12-29 DIAGNOSIS — F03918 Unspecified dementia, unspecified severity, with other behavioral disturbance: Secondary | ICD-10-CM | POA: Diagnosis not present

## 2023-12-31 DIAGNOSIS — Z79899 Other long term (current) drug therapy: Secondary | ICD-10-CM | POA: Diagnosis not present

## 2023-12-31 DIAGNOSIS — F430 Acute stress reaction: Secondary | ICD-10-CM | POA: Diagnosis not present

## 2023-12-31 DIAGNOSIS — R630 Anorexia: Secondary | ICD-10-CM | POA: Diagnosis not present

## 2023-12-31 DIAGNOSIS — F5104 Psychophysiologic insomnia: Secondary | ICD-10-CM | POA: Diagnosis not present

## 2023-12-31 DIAGNOSIS — F03A0 Unspecified dementia, mild, without behavioral disturbance, psychotic disturbance, mood disturbance, and anxiety: Secondary | ICD-10-CM | POA: Diagnosis not present

## 2023-12-31 DIAGNOSIS — Z9181 History of falling: Secondary | ICD-10-CM | POA: Diagnosis not present

## 2024-01-02 DIAGNOSIS — R4189 Other symptoms and signs involving cognitive functions and awareness: Secondary | ICD-10-CM | POA: Diagnosis not present

## 2024-01-02 DIAGNOSIS — F03918 Unspecified dementia, unspecified severity, with other behavioral disturbance: Secondary | ICD-10-CM | POA: Diagnosis not present

## 2024-01-02 DIAGNOSIS — R2681 Unsteadiness on feet: Secondary | ICD-10-CM | POA: Diagnosis not present

## 2024-01-02 DIAGNOSIS — G309 Alzheimer's disease, unspecified: Secondary | ICD-10-CM | POA: Diagnosis not present

## 2024-01-02 DIAGNOSIS — R41841 Cognitive communication deficit: Secondary | ICD-10-CM | POA: Diagnosis not present

## 2024-01-02 DIAGNOSIS — M6281 Muscle weakness (generalized): Secondary | ICD-10-CM | POA: Diagnosis not present

## 2024-01-02 DIAGNOSIS — R4182 Altered mental status, unspecified: Secondary | ICD-10-CM | POA: Diagnosis not present

## 2024-01-03 DIAGNOSIS — R4182 Altered mental status, unspecified: Secondary | ICD-10-CM | POA: Diagnosis not present

## 2024-01-03 DIAGNOSIS — F03918 Unspecified dementia, unspecified severity, with other behavioral disturbance: Secondary | ICD-10-CM | POA: Diagnosis not present

## 2024-01-03 DIAGNOSIS — R4189 Other symptoms and signs involving cognitive functions and awareness: Secondary | ICD-10-CM | POA: Diagnosis not present

## 2024-01-03 DIAGNOSIS — R41841 Cognitive communication deficit: Secondary | ICD-10-CM | POA: Diagnosis not present

## 2024-01-04 DIAGNOSIS — M6281 Muscle weakness (generalized): Secondary | ICD-10-CM | POA: Diagnosis not present

## 2024-01-04 DIAGNOSIS — R2681 Unsteadiness on feet: Secondary | ICD-10-CM | POA: Diagnosis not present

## 2024-01-04 DIAGNOSIS — F430 Acute stress reaction: Secondary | ICD-10-CM | POA: Diagnosis not present

## 2024-01-04 DIAGNOSIS — R630 Anorexia: Secondary | ICD-10-CM | POA: Diagnosis not present

## 2024-01-04 DIAGNOSIS — G309 Alzheimer's disease, unspecified: Secondary | ICD-10-CM | POA: Diagnosis not present

## 2024-01-04 DIAGNOSIS — F03A0 Unspecified dementia, mild, without behavioral disturbance, psychotic disturbance, mood disturbance, and anxiety: Secondary | ICD-10-CM | POA: Diagnosis not present

## 2024-01-04 DIAGNOSIS — Z79899 Other long term (current) drug therapy: Secondary | ICD-10-CM | POA: Diagnosis not present

## 2024-01-04 DIAGNOSIS — Z9181 History of falling: Secondary | ICD-10-CM | POA: Diagnosis not present

## 2024-01-04 DIAGNOSIS — F5104 Psychophysiologic insomnia: Secondary | ICD-10-CM | POA: Diagnosis not present

## 2024-01-05 DIAGNOSIS — R41841 Cognitive communication deficit: Secondary | ICD-10-CM | POA: Diagnosis not present

## 2024-01-05 DIAGNOSIS — R4189 Other symptoms and signs involving cognitive functions and awareness: Secondary | ICD-10-CM | POA: Diagnosis not present

## 2024-01-05 DIAGNOSIS — N3946 Mixed incontinence: Secondary | ICD-10-CM | POA: Diagnosis not present

## 2024-01-05 DIAGNOSIS — F03918 Unspecified dementia, unspecified severity, with other behavioral disturbance: Secondary | ICD-10-CM | POA: Diagnosis not present

## 2024-01-05 DIAGNOSIS — M6281 Muscle weakness (generalized): Secondary | ICD-10-CM | POA: Diagnosis not present

## 2024-01-05 DIAGNOSIS — R4182 Altered mental status, unspecified: Secondary | ICD-10-CM | POA: Diagnosis not present

## 2024-01-05 DIAGNOSIS — Z9181 History of falling: Secondary | ICD-10-CM | POA: Diagnosis not present

## 2024-01-05 DIAGNOSIS — G309 Alzheimer's disease, unspecified: Secondary | ICD-10-CM | POA: Diagnosis not present

## 2024-01-06 DIAGNOSIS — N3946 Mixed incontinence: Secondary | ICD-10-CM | POA: Diagnosis not present

## 2024-01-06 DIAGNOSIS — Z9181 History of falling: Secondary | ICD-10-CM | POA: Diagnosis not present

## 2024-01-06 DIAGNOSIS — M6281 Muscle weakness (generalized): Secondary | ICD-10-CM | POA: Diagnosis not present

## 2024-01-06 DIAGNOSIS — G309 Alzheimer's disease, unspecified: Secondary | ICD-10-CM | POA: Diagnosis not present

## 2024-01-10 DIAGNOSIS — M6281 Muscle weakness (generalized): Secondary | ICD-10-CM | POA: Diagnosis not present

## 2024-01-10 DIAGNOSIS — N3946 Mixed incontinence: Secondary | ICD-10-CM | POA: Diagnosis not present

## 2024-01-10 DIAGNOSIS — F03918 Unspecified dementia, unspecified severity, with other behavioral disturbance: Secondary | ICD-10-CM | POA: Diagnosis not present

## 2024-01-10 DIAGNOSIS — R4189 Other symptoms and signs involving cognitive functions and awareness: Secondary | ICD-10-CM | POA: Diagnosis not present

## 2024-01-10 DIAGNOSIS — R4182 Altered mental status, unspecified: Secondary | ICD-10-CM | POA: Diagnosis not present

## 2024-01-10 DIAGNOSIS — R41841 Cognitive communication deficit: Secondary | ICD-10-CM | POA: Diagnosis not present

## 2024-01-10 DIAGNOSIS — Z9181 History of falling: Secondary | ICD-10-CM | POA: Diagnosis not present

## 2024-01-10 DIAGNOSIS — R2681 Unsteadiness on feet: Secondary | ICD-10-CM | POA: Diagnosis not present

## 2024-01-10 DIAGNOSIS — G309 Alzheimer's disease, unspecified: Secondary | ICD-10-CM | POA: Diagnosis not present

## 2024-01-11 DIAGNOSIS — M6281 Muscle weakness (generalized): Secondary | ICD-10-CM | POA: Diagnosis not present

## 2024-01-11 DIAGNOSIS — R2681 Unsteadiness on feet: Secondary | ICD-10-CM | POA: Diagnosis not present

## 2024-01-11 DIAGNOSIS — G309 Alzheimer's disease, unspecified: Secondary | ICD-10-CM | POA: Diagnosis not present

## 2024-01-12 DIAGNOSIS — R2681 Unsteadiness on feet: Secondary | ICD-10-CM | POA: Diagnosis not present

## 2024-01-12 DIAGNOSIS — R4189 Other symptoms and signs involving cognitive functions and awareness: Secondary | ICD-10-CM | POA: Diagnosis not present

## 2024-01-12 DIAGNOSIS — G309 Alzheimer's disease, unspecified: Secondary | ICD-10-CM | POA: Diagnosis not present

## 2024-01-12 DIAGNOSIS — M6281 Muscle weakness (generalized): Secondary | ICD-10-CM | POA: Diagnosis not present

## 2024-01-12 DIAGNOSIS — R41841 Cognitive communication deficit: Secondary | ICD-10-CM | POA: Diagnosis not present

## 2024-01-12 DIAGNOSIS — R4182 Altered mental status, unspecified: Secondary | ICD-10-CM | POA: Diagnosis not present

## 2024-01-12 DIAGNOSIS — F03918 Unspecified dementia, unspecified severity, with other behavioral disturbance: Secondary | ICD-10-CM | POA: Diagnosis not present

## 2024-01-12 DIAGNOSIS — N3946 Mixed incontinence: Secondary | ICD-10-CM | POA: Diagnosis not present

## 2024-01-12 DIAGNOSIS — Z9181 History of falling: Secondary | ICD-10-CM | POA: Diagnosis not present

## 2024-01-13 DIAGNOSIS — N3946 Mixed incontinence: Secondary | ICD-10-CM | POA: Diagnosis not present

## 2024-01-13 DIAGNOSIS — F03918 Unspecified dementia, unspecified severity, with other behavioral disturbance: Secondary | ICD-10-CM | POA: Diagnosis not present

## 2024-01-13 DIAGNOSIS — G309 Alzheimer's disease, unspecified: Secondary | ICD-10-CM | POA: Diagnosis not present

## 2024-01-13 DIAGNOSIS — R4182 Altered mental status, unspecified: Secondary | ICD-10-CM | POA: Diagnosis not present

## 2024-01-13 DIAGNOSIS — R41841 Cognitive communication deficit: Secondary | ICD-10-CM | POA: Diagnosis not present

## 2024-01-13 DIAGNOSIS — R4189 Other symptoms and signs involving cognitive functions and awareness: Secondary | ICD-10-CM | POA: Diagnosis not present

## 2024-01-13 DIAGNOSIS — Z9181 History of falling: Secondary | ICD-10-CM | POA: Diagnosis not present

## 2024-01-13 DIAGNOSIS — M6281 Muscle weakness (generalized): Secondary | ICD-10-CM | POA: Diagnosis not present

## 2024-01-16 DIAGNOSIS — R4182 Altered mental status, unspecified: Secondary | ICD-10-CM | POA: Diagnosis not present

## 2024-01-16 DIAGNOSIS — F03A0 Unspecified dementia, mild, without behavioral disturbance, psychotic disturbance, mood disturbance, and anxiety: Secondary | ICD-10-CM | POA: Diagnosis not present

## 2024-01-16 DIAGNOSIS — R630 Anorexia: Secondary | ICD-10-CM | POA: Diagnosis not present

## 2024-01-16 DIAGNOSIS — Z9181 History of falling: Secondary | ICD-10-CM | POA: Diagnosis not present

## 2024-01-16 DIAGNOSIS — Z79899 Other long term (current) drug therapy: Secondary | ICD-10-CM | POA: Diagnosis not present

## 2024-01-16 DIAGNOSIS — N3946 Mixed incontinence: Secondary | ICD-10-CM | POA: Diagnosis not present

## 2024-01-16 DIAGNOSIS — G309 Alzheimer's disease, unspecified: Secondary | ICD-10-CM | POA: Diagnosis not present

## 2024-01-16 DIAGNOSIS — R41841 Cognitive communication deficit: Secondary | ICD-10-CM | POA: Diagnosis not present

## 2024-01-16 DIAGNOSIS — F430 Acute stress reaction: Secondary | ICD-10-CM | POA: Diagnosis not present

## 2024-01-16 DIAGNOSIS — R2681 Unsteadiness on feet: Secondary | ICD-10-CM | POA: Diagnosis not present

## 2024-01-16 DIAGNOSIS — M6281 Muscle weakness (generalized): Secondary | ICD-10-CM | POA: Diagnosis not present

## 2024-01-16 DIAGNOSIS — F5104 Psychophysiologic insomnia: Secondary | ICD-10-CM | POA: Diagnosis not present

## 2024-01-16 DIAGNOSIS — R4189 Other symptoms and signs involving cognitive functions and awareness: Secondary | ICD-10-CM | POA: Diagnosis not present

## 2024-01-16 DIAGNOSIS — F03918 Unspecified dementia, unspecified severity, with other behavioral disturbance: Secondary | ICD-10-CM | POA: Diagnosis not present

## 2024-01-17 DIAGNOSIS — F03918 Unspecified dementia, unspecified severity, with other behavioral disturbance: Secondary | ICD-10-CM | POA: Diagnosis not present

## 2024-01-17 DIAGNOSIS — N3946 Mixed incontinence: Secondary | ICD-10-CM | POA: Diagnosis not present

## 2024-01-17 DIAGNOSIS — R2681 Unsteadiness on feet: Secondary | ICD-10-CM | POA: Diagnosis not present

## 2024-01-17 DIAGNOSIS — Z9181 History of falling: Secondary | ICD-10-CM | POA: Diagnosis not present

## 2024-01-17 DIAGNOSIS — R4182 Altered mental status, unspecified: Secondary | ICD-10-CM | POA: Diagnosis not present

## 2024-01-17 DIAGNOSIS — G309 Alzheimer's disease, unspecified: Secondary | ICD-10-CM | POA: Diagnosis not present

## 2024-01-17 DIAGNOSIS — R41841 Cognitive communication deficit: Secondary | ICD-10-CM | POA: Diagnosis not present

## 2024-01-17 DIAGNOSIS — R4189 Other symptoms and signs involving cognitive functions and awareness: Secondary | ICD-10-CM | POA: Diagnosis not present

## 2024-01-17 DIAGNOSIS — M6281 Muscle weakness (generalized): Secondary | ICD-10-CM | POA: Diagnosis not present

## 2024-01-19 DIAGNOSIS — F03918 Unspecified dementia, unspecified severity, with other behavioral disturbance: Secondary | ICD-10-CM | POA: Diagnosis not present

## 2024-01-19 DIAGNOSIS — G309 Alzheimer's disease, unspecified: Secondary | ICD-10-CM | POA: Diagnosis not present

## 2024-01-19 DIAGNOSIS — Z9181 History of falling: Secondary | ICD-10-CM | POA: Diagnosis not present

## 2024-01-19 DIAGNOSIS — R41841 Cognitive communication deficit: Secondary | ICD-10-CM | POA: Diagnosis not present

## 2024-01-19 DIAGNOSIS — R4182 Altered mental status, unspecified: Secondary | ICD-10-CM | POA: Diagnosis not present

## 2024-01-19 DIAGNOSIS — R4189 Other symptoms and signs involving cognitive functions and awareness: Secondary | ICD-10-CM | POA: Diagnosis not present

## 2024-01-19 DIAGNOSIS — N3946 Mixed incontinence: Secondary | ICD-10-CM | POA: Diagnosis not present

## 2024-01-19 DIAGNOSIS — M6281 Muscle weakness (generalized): Secondary | ICD-10-CM | POA: Diagnosis not present

## 2024-01-20 DIAGNOSIS — F430 Acute stress reaction: Secondary | ICD-10-CM | POA: Diagnosis not present

## 2024-01-20 DIAGNOSIS — F03B3 Unspecified dementia, moderate, with mood disturbance: Secondary | ICD-10-CM | POA: Diagnosis not present

## 2024-01-20 DIAGNOSIS — F5104 Psychophysiologic insomnia: Secondary | ICD-10-CM | POA: Diagnosis not present

## 2024-01-20 DIAGNOSIS — R630 Anorexia: Secondary | ICD-10-CM | POA: Diagnosis not present

## 2024-01-20 DIAGNOSIS — R2681 Unsteadiness on feet: Secondary | ICD-10-CM | POA: Diagnosis not present

## 2024-01-20 DIAGNOSIS — Z9181 History of falling: Secondary | ICD-10-CM | POA: Diagnosis not present

## 2024-01-20 DIAGNOSIS — M6281 Muscle weakness (generalized): Secondary | ICD-10-CM | POA: Diagnosis not present

## 2024-01-20 DIAGNOSIS — Z79899 Other long term (current) drug therapy: Secondary | ICD-10-CM | POA: Diagnosis not present

## 2024-01-20 DIAGNOSIS — G309 Alzheimer's disease, unspecified: Secondary | ICD-10-CM | POA: Diagnosis not present

## 2024-01-23 DIAGNOSIS — G309 Alzheimer's disease, unspecified: Secondary | ICD-10-CM | POA: Diagnosis not present

## 2024-01-23 DIAGNOSIS — M6281 Muscle weakness (generalized): Secondary | ICD-10-CM | POA: Diagnosis not present

## 2024-01-23 DIAGNOSIS — R2681 Unsteadiness on feet: Secondary | ICD-10-CM | POA: Diagnosis not present

## 2024-01-24 DIAGNOSIS — I10 Essential (primary) hypertension: Secondary | ICD-10-CM | POA: Diagnosis not present

## 2024-01-24 DIAGNOSIS — M6281 Muscle weakness (generalized): Secondary | ICD-10-CM | POA: Diagnosis not present

## 2024-01-24 DIAGNOSIS — Z9181 History of falling: Secondary | ICD-10-CM | POA: Diagnosis not present

## 2024-01-24 DIAGNOSIS — M25562 Pain in left knee: Secondary | ICD-10-CM | POA: Diagnosis not present

## 2024-01-24 DIAGNOSIS — F02B0 Dementia in other diseases classified elsewhere, moderate, without behavioral disturbance, psychotic disturbance, mood disturbance, and anxiety: Secondary | ICD-10-CM | POA: Diagnosis not present

## 2024-01-24 DIAGNOSIS — G309 Alzheimer's disease, unspecified: Secondary | ICD-10-CM | POA: Diagnosis not present

## 2024-01-24 DIAGNOSIS — R41841 Cognitive communication deficit: Secondary | ICD-10-CM | POA: Diagnosis not present

## 2024-01-24 DIAGNOSIS — R4182 Altered mental status, unspecified: Secondary | ICD-10-CM | POA: Diagnosis not present

## 2024-01-24 DIAGNOSIS — R4189 Other symptoms and signs involving cognitive functions and awareness: Secondary | ICD-10-CM | POA: Diagnosis not present

## 2024-01-24 DIAGNOSIS — F02B4 Dementia in other diseases classified elsewhere, moderate, with anxiety: Secondary | ICD-10-CM | POA: Diagnosis not present

## 2024-01-24 DIAGNOSIS — Z043 Encounter for examination and observation following other accident: Secondary | ICD-10-CM | POA: Diagnosis not present

## 2024-01-24 DIAGNOSIS — M47812 Spondylosis without myelopathy or radiculopathy, cervical region: Secondary | ICD-10-CM | POA: Diagnosis not present

## 2024-01-24 DIAGNOSIS — F0280 Dementia in other diseases classified elsewhere without behavioral disturbance: Secondary | ICD-10-CM | POA: Diagnosis not present

## 2024-01-24 DIAGNOSIS — M4800 Spinal stenosis, site unspecified: Secondary | ICD-10-CM | POA: Diagnosis not present

## 2024-01-24 DIAGNOSIS — N3000 Acute cystitis without hematuria: Secondary | ICD-10-CM | POA: Diagnosis not present

## 2024-01-24 DIAGNOSIS — Z8673 Personal history of transient ischemic attack (TIA), and cerebral infarction without residual deficits: Secondary | ICD-10-CM | POA: Diagnosis not present

## 2024-01-24 DIAGNOSIS — R2681 Unsteadiness on feet: Secondary | ICD-10-CM | POA: Diagnosis not present

## 2024-01-24 DIAGNOSIS — F03918 Unspecified dementia, unspecified severity, with other behavioral disturbance: Secondary | ICD-10-CM | POA: Diagnosis not present

## 2024-01-24 DIAGNOSIS — R001 Bradycardia, unspecified: Secondary | ICD-10-CM | POA: Diagnosis not present

## 2024-01-24 DIAGNOSIS — M25561 Pain in right knee: Secondary | ICD-10-CM | POA: Diagnosis not present

## 2024-01-24 DIAGNOSIS — N3946 Mixed incontinence: Secondary | ICD-10-CM | POA: Diagnosis not present

## 2024-01-25 DIAGNOSIS — M6281 Muscle weakness (generalized): Secondary | ICD-10-CM | POA: Diagnosis not present

## 2024-01-25 DIAGNOSIS — G309 Alzheimer's disease, unspecified: Secondary | ICD-10-CM | POA: Diagnosis not present

## 2024-01-25 DIAGNOSIS — Z7401 Bed confinement status: Secondary | ICD-10-CM | POA: Diagnosis not present

## 2024-01-25 DIAGNOSIS — N3946 Mixed incontinence: Secondary | ICD-10-CM | POA: Diagnosis not present

## 2024-01-25 DIAGNOSIS — Z9181 History of falling: Secondary | ICD-10-CM | POA: Diagnosis not present

## 2024-01-25 DIAGNOSIS — R4182 Altered mental status, unspecified: Secondary | ICD-10-CM | POA: Diagnosis not present

## 2024-01-26 DIAGNOSIS — F03918 Unspecified dementia, unspecified severity, with other behavioral disturbance: Secondary | ICD-10-CM | POA: Diagnosis not present

## 2024-01-26 DIAGNOSIS — R41841 Cognitive communication deficit: Secondary | ICD-10-CM | POA: Diagnosis not present

## 2024-01-26 DIAGNOSIS — R4182 Altered mental status, unspecified: Secondary | ICD-10-CM | POA: Diagnosis not present

## 2024-01-26 DIAGNOSIS — R4189 Other symptoms and signs involving cognitive functions and awareness: Secondary | ICD-10-CM | POA: Diagnosis not present

## 2024-01-27 DIAGNOSIS — N3946 Mixed incontinence: Secondary | ICD-10-CM | POA: Diagnosis not present

## 2024-01-27 DIAGNOSIS — M6281 Muscle weakness (generalized): Secondary | ICD-10-CM | POA: Diagnosis not present

## 2024-01-27 DIAGNOSIS — Z9181 History of falling: Secondary | ICD-10-CM | POA: Diagnosis not present

## 2024-01-27 DIAGNOSIS — R41841 Cognitive communication deficit: Secondary | ICD-10-CM | POA: Diagnosis not present

## 2024-01-27 DIAGNOSIS — G309 Alzheimer's disease, unspecified: Secondary | ICD-10-CM | POA: Diagnosis not present

## 2024-01-27 DIAGNOSIS — F03918 Unspecified dementia, unspecified severity, with other behavioral disturbance: Secondary | ICD-10-CM | POA: Diagnosis not present

## 2024-01-27 DIAGNOSIS — R4182 Altered mental status, unspecified: Secondary | ICD-10-CM | POA: Diagnosis not present

## 2024-01-27 DIAGNOSIS — R4189 Other symptoms and signs involving cognitive functions and awareness: Secondary | ICD-10-CM | POA: Diagnosis not present

## 2024-01-30 DIAGNOSIS — R4182 Altered mental status, unspecified: Secondary | ICD-10-CM | POA: Diagnosis not present

## 2024-01-30 DIAGNOSIS — R41841 Cognitive communication deficit: Secondary | ICD-10-CM | POA: Diagnosis not present

## 2024-01-30 DIAGNOSIS — G309 Alzheimer's disease, unspecified: Secondary | ICD-10-CM | POA: Diagnosis not present

## 2024-01-30 DIAGNOSIS — M6281 Muscle weakness (generalized): Secondary | ICD-10-CM | POA: Diagnosis not present

## 2024-01-30 DIAGNOSIS — Z9181 History of falling: Secondary | ICD-10-CM | POA: Diagnosis not present

## 2024-01-30 DIAGNOSIS — F03918 Unspecified dementia, unspecified severity, with other behavioral disturbance: Secondary | ICD-10-CM | POA: Diagnosis not present

## 2024-01-30 DIAGNOSIS — N3946 Mixed incontinence: Secondary | ICD-10-CM | POA: Diagnosis not present

## 2024-01-30 DIAGNOSIS — R4189 Other symptoms and signs involving cognitive functions and awareness: Secondary | ICD-10-CM | POA: Diagnosis not present

## 2024-01-31 DIAGNOSIS — G309 Alzheimer's disease, unspecified: Secondary | ICD-10-CM | POA: Diagnosis not present

## 2024-01-31 DIAGNOSIS — N3946 Mixed incontinence: Secondary | ICD-10-CM | POA: Diagnosis not present

## 2024-01-31 DIAGNOSIS — F03918 Unspecified dementia, unspecified severity, with other behavioral disturbance: Secondary | ICD-10-CM | POA: Diagnosis not present

## 2024-01-31 DIAGNOSIS — R4182 Altered mental status, unspecified: Secondary | ICD-10-CM | POA: Diagnosis not present

## 2024-01-31 DIAGNOSIS — R4189 Other symptoms and signs involving cognitive functions and awareness: Secondary | ICD-10-CM | POA: Diagnosis not present

## 2024-01-31 DIAGNOSIS — Z9181 History of falling: Secondary | ICD-10-CM | POA: Diagnosis not present

## 2024-01-31 DIAGNOSIS — M6281 Muscle weakness (generalized): Secondary | ICD-10-CM | POA: Diagnosis not present

## 2024-01-31 DIAGNOSIS — R41841 Cognitive communication deficit: Secondary | ICD-10-CM | POA: Diagnosis not present

## 2024-02-02 DIAGNOSIS — R4189 Other symptoms and signs involving cognitive functions and awareness: Secondary | ICD-10-CM | POA: Diagnosis not present

## 2024-02-02 DIAGNOSIS — F03918 Unspecified dementia, unspecified severity, with other behavioral disturbance: Secondary | ICD-10-CM | POA: Diagnosis not present

## 2024-02-02 DIAGNOSIS — R41841 Cognitive communication deficit: Secondary | ICD-10-CM | POA: Diagnosis not present

## 2024-02-02 DIAGNOSIS — Z9181 History of falling: Secondary | ICD-10-CM | POA: Diagnosis not present

## 2024-02-02 DIAGNOSIS — R4182 Altered mental status, unspecified: Secondary | ICD-10-CM | POA: Diagnosis not present

## 2024-02-02 DIAGNOSIS — Z79899 Other long term (current) drug therapy: Secondary | ICD-10-CM | POA: Diagnosis not present

## 2024-02-02 DIAGNOSIS — R4583 Excessive crying of child, adolescent or adult: Secondary | ICD-10-CM | POA: Diagnosis not present

## 2024-02-02 DIAGNOSIS — F5104 Psychophysiologic insomnia: Secondary | ICD-10-CM | POA: Diagnosis not present

## 2024-02-02 DIAGNOSIS — F430 Acute stress reaction: Secondary | ICD-10-CM | POA: Diagnosis not present

## 2024-02-02 DIAGNOSIS — R630 Anorexia: Secondary | ICD-10-CM | POA: Diagnosis not present

## 2024-02-02 DIAGNOSIS — F03B3 Unspecified dementia, moderate, with mood disturbance: Secondary | ICD-10-CM | POA: Diagnosis not present

## 2024-02-03 DIAGNOSIS — M6281 Muscle weakness (generalized): Secondary | ICD-10-CM | POA: Diagnosis not present

## 2024-02-03 DIAGNOSIS — N3946 Mixed incontinence: Secondary | ICD-10-CM | POA: Diagnosis not present

## 2024-02-03 DIAGNOSIS — G309 Alzheimer's disease, unspecified: Secondary | ICD-10-CM | POA: Diagnosis not present

## 2024-02-03 DIAGNOSIS — Z9181 History of falling: Secondary | ICD-10-CM | POA: Diagnosis not present

## 2024-02-05 DIAGNOSIS — G309 Alzheimer's disease, unspecified: Secondary | ICD-10-CM | POA: Diagnosis not present

## 2024-02-05 DIAGNOSIS — R2681 Unsteadiness on feet: Secondary | ICD-10-CM | POA: Diagnosis not present

## 2024-02-05 DIAGNOSIS — M6281 Muscle weakness (generalized): Secondary | ICD-10-CM | POA: Diagnosis not present

## 2024-02-06 DIAGNOSIS — R4182 Altered mental status, unspecified: Secondary | ICD-10-CM | POA: Diagnosis not present

## 2024-02-06 DIAGNOSIS — R41841 Cognitive communication deficit: Secondary | ICD-10-CM | POA: Diagnosis not present

## 2024-02-06 DIAGNOSIS — R4189 Other symptoms and signs involving cognitive functions and awareness: Secondary | ICD-10-CM | POA: Diagnosis not present

## 2024-02-06 DIAGNOSIS — F03918 Unspecified dementia, unspecified severity, with other behavioral disturbance: Secondary | ICD-10-CM | POA: Diagnosis not present

## 2024-02-07 DIAGNOSIS — R4189 Other symptoms and signs involving cognitive functions and awareness: Secondary | ICD-10-CM | POA: Diagnosis not present

## 2024-02-07 DIAGNOSIS — Z9181 History of falling: Secondary | ICD-10-CM | POA: Diagnosis not present

## 2024-02-07 DIAGNOSIS — R41841 Cognitive communication deficit: Secondary | ICD-10-CM | POA: Diagnosis not present

## 2024-02-07 DIAGNOSIS — G309 Alzheimer's disease, unspecified: Secondary | ICD-10-CM | POA: Diagnosis not present

## 2024-02-07 DIAGNOSIS — M6281 Muscle weakness (generalized): Secondary | ICD-10-CM | POA: Diagnosis not present

## 2024-02-07 DIAGNOSIS — F03918 Unspecified dementia, unspecified severity, with other behavioral disturbance: Secondary | ICD-10-CM | POA: Diagnosis not present

## 2024-02-07 DIAGNOSIS — R4182 Altered mental status, unspecified: Secondary | ICD-10-CM | POA: Diagnosis not present

## 2024-02-07 DIAGNOSIS — N3946 Mixed incontinence: Secondary | ICD-10-CM | POA: Diagnosis not present

## 2024-02-08 DIAGNOSIS — R159 Full incontinence of feces: Secondary | ICD-10-CM | POA: Diagnosis not present

## 2024-02-08 DIAGNOSIS — M810 Age-related osteoporosis without current pathological fracture: Secondary | ICD-10-CM | POA: Diagnosis not present

## 2024-02-08 DIAGNOSIS — Z66 Do not resuscitate: Secondary | ICD-10-CM | POA: Diagnosis not present

## 2024-02-08 DIAGNOSIS — F02811 Dementia in other diseases classified elsewhere, unspecified severity, with agitation: Secondary | ICD-10-CM | POA: Diagnosis not present

## 2024-02-08 DIAGNOSIS — R32 Unspecified urinary incontinence: Secondary | ICD-10-CM | POA: Diagnosis not present

## 2024-02-08 DIAGNOSIS — Z8673 Personal history of transient ischemic attack (TIA), and cerebral infarction without residual deficits: Secondary | ICD-10-CM | POA: Diagnosis not present

## 2024-02-08 DIAGNOSIS — F32A Depression, unspecified: Secondary | ICD-10-CM | POA: Diagnosis not present

## 2024-02-08 DIAGNOSIS — N3946 Mixed incontinence: Secondary | ICD-10-CM | POA: Diagnosis not present

## 2024-02-08 DIAGNOSIS — E785 Hyperlipidemia, unspecified: Secondary | ICD-10-CM | POA: Diagnosis not present

## 2024-02-08 DIAGNOSIS — Z87440 Personal history of urinary (tract) infections: Secondary | ICD-10-CM | POA: Diagnosis not present

## 2024-02-08 DIAGNOSIS — F419 Anxiety disorder, unspecified: Secondary | ICD-10-CM | POA: Diagnosis not present

## 2024-02-08 DIAGNOSIS — Z87891 Personal history of nicotine dependence: Secondary | ICD-10-CM | POA: Diagnosis not present

## 2024-02-08 DIAGNOSIS — R296 Repeated falls: Secondary | ICD-10-CM | POA: Diagnosis not present

## 2024-02-08 DIAGNOSIS — Z9181 History of falling: Secondary | ICD-10-CM | POA: Diagnosis not present

## 2024-02-08 DIAGNOSIS — G309 Alzheimer's disease, unspecified: Secondary | ICD-10-CM | POA: Diagnosis not present

## 2024-02-08 DIAGNOSIS — M6281 Muscle weakness (generalized): Secondary | ICD-10-CM | POA: Diagnosis not present

## 2024-02-08 DIAGNOSIS — I10 Essential (primary) hypertension: Secondary | ICD-10-CM | POA: Diagnosis not present

## 2024-02-08 DIAGNOSIS — Z993 Dependence on wheelchair: Secondary | ICD-10-CM | POA: Diagnosis not present

## 2024-02-09 DIAGNOSIS — R4189 Other symptoms and signs involving cognitive functions and awareness: Secondary | ICD-10-CM | POA: Diagnosis not present

## 2024-02-09 DIAGNOSIS — F03918 Unspecified dementia, unspecified severity, with other behavioral disturbance: Secondary | ICD-10-CM | POA: Diagnosis not present

## 2024-02-09 DIAGNOSIS — G309 Alzheimer's disease, unspecified: Secondary | ICD-10-CM | POA: Diagnosis not present

## 2024-02-09 DIAGNOSIS — F419 Anxiety disorder, unspecified: Secondary | ICD-10-CM | POA: Diagnosis not present

## 2024-02-09 DIAGNOSIS — F32A Depression, unspecified: Secondary | ICD-10-CM | POA: Diagnosis not present

## 2024-02-09 DIAGNOSIS — F02811 Dementia in other diseases classified elsewhere, unspecified severity, with agitation: Secondary | ICD-10-CM | POA: Diagnosis not present

## 2024-02-09 DIAGNOSIS — R4182 Altered mental status, unspecified: Secondary | ICD-10-CM | POA: Diagnosis not present

## 2024-02-09 DIAGNOSIS — R159 Full incontinence of feces: Secondary | ICD-10-CM | POA: Diagnosis not present

## 2024-02-09 DIAGNOSIS — R41841 Cognitive communication deficit: Secondary | ICD-10-CM | POA: Diagnosis not present

## 2024-02-09 DIAGNOSIS — R32 Unspecified urinary incontinence: Secondary | ICD-10-CM | POA: Diagnosis not present

## 2024-02-10 DIAGNOSIS — R32 Unspecified urinary incontinence: Secondary | ICD-10-CM | POA: Diagnosis not present

## 2024-02-10 DIAGNOSIS — F32A Depression, unspecified: Secondary | ICD-10-CM | POA: Diagnosis not present

## 2024-02-10 DIAGNOSIS — F419 Anxiety disorder, unspecified: Secondary | ICD-10-CM | POA: Diagnosis not present

## 2024-02-10 DIAGNOSIS — R159 Full incontinence of feces: Secondary | ICD-10-CM | POA: Diagnosis not present

## 2024-02-10 DIAGNOSIS — G309 Alzheimer's disease, unspecified: Secondary | ICD-10-CM | POA: Diagnosis not present

## 2024-02-10 DIAGNOSIS — F02811 Dementia in other diseases classified elsewhere, unspecified severity, with agitation: Secondary | ICD-10-CM | POA: Diagnosis not present

## 2024-02-13 DIAGNOSIS — F02811 Dementia in other diseases classified elsewhere, unspecified severity, with agitation: Secondary | ICD-10-CM | POA: Diagnosis not present

## 2024-02-13 DIAGNOSIS — F32A Depression, unspecified: Secondary | ICD-10-CM | POA: Diagnosis not present

## 2024-02-13 DIAGNOSIS — G309 Alzheimer's disease, unspecified: Secondary | ICD-10-CM | POA: Diagnosis not present

## 2024-02-13 DIAGNOSIS — R159 Full incontinence of feces: Secondary | ICD-10-CM | POA: Diagnosis not present

## 2024-02-13 DIAGNOSIS — F419 Anxiety disorder, unspecified: Secondary | ICD-10-CM | POA: Diagnosis not present

## 2024-02-13 DIAGNOSIS — R32 Unspecified urinary incontinence: Secondary | ICD-10-CM | POA: Diagnosis not present

## 2024-02-14 DIAGNOSIS — R32 Unspecified urinary incontinence: Secondary | ICD-10-CM | POA: Diagnosis not present

## 2024-02-14 DIAGNOSIS — F02811 Dementia in other diseases classified elsewhere, unspecified severity, with agitation: Secondary | ICD-10-CM | POA: Diagnosis not present

## 2024-02-14 DIAGNOSIS — F419 Anxiety disorder, unspecified: Secondary | ICD-10-CM | POA: Diagnosis not present

## 2024-02-14 DIAGNOSIS — F32A Depression, unspecified: Secondary | ICD-10-CM | POA: Diagnosis not present

## 2024-02-14 DIAGNOSIS — G309 Alzheimer's disease, unspecified: Secondary | ICD-10-CM | POA: Diagnosis not present

## 2024-02-14 DIAGNOSIS — R159 Full incontinence of feces: Secondary | ICD-10-CM | POA: Diagnosis not present

## 2024-02-17 DIAGNOSIS — R159 Full incontinence of feces: Secondary | ICD-10-CM | POA: Diagnosis not present

## 2024-02-17 DIAGNOSIS — F02811 Dementia in other diseases classified elsewhere, unspecified severity, with agitation: Secondary | ICD-10-CM | POA: Diagnosis not present

## 2024-02-17 DIAGNOSIS — G309 Alzheimer's disease, unspecified: Secondary | ICD-10-CM | POA: Diagnosis not present

## 2024-02-17 DIAGNOSIS — R32 Unspecified urinary incontinence: Secondary | ICD-10-CM | POA: Diagnosis not present

## 2024-02-17 DIAGNOSIS — F419 Anxiety disorder, unspecified: Secondary | ICD-10-CM | POA: Diagnosis not present

## 2024-02-17 DIAGNOSIS — F32A Depression, unspecified: Secondary | ICD-10-CM | POA: Diagnosis not present

## 2024-02-20 ENCOUNTER — Encounter

## 2024-02-20 DIAGNOSIS — F02811 Dementia in other diseases classified elsewhere, unspecified severity, with agitation: Secondary | ICD-10-CM | POA: Diagnosis not present

## 2024-02-20 DIAGNOSIS — R159 Full incontinence of feces: Secondary | ICD-10-CM | POA: Diagnosis not present

## 2024-02-20 DIAGNOSIS — G309 Alzheimer's disease, unspecified: Secondary | ICD-10-CM | POA: Diagnosis not present

## 2024-02-20 DIAGNOSIS — R32 Unspecified urinary incontinence: Secondary | ICD-10-CM | POA: Diagnosis not present

## 2024-02-20 DIAGNOSIS — F419 Anxiety disorder, unspecified: Secondary | ICD-10-CM | POA: Diagnosis not present

## 2024-02-20 DIAGNOSIS — F32A Depression, unspecified: Secondary | ICD-10-CM | POA: Diagnosis not present

## 2024-02-21 DIAGNOSIS — R159 Full incontinence of feces: Secondary | ICD-10-CM | POA: Diagnosis not present

## 2024-02-21 DIAGNOSIS — F02811 Dementia in other diseases classified elsewhere, unspecified severity, with agitation: Secondary | ICD-10-CM | POA: Diagnosis not present

## 2024-02-21 DIAGNOSIS — G309 Alzheimer's disease, unspecified: Secondary | ICD-10-CM | POA: Diagnosis not present

## 2024-02-21 DIAGNOSIS — F32A Depression, unspecified: Secondary | ICD-10-CM | POA: Diagnosis not present

## 2024-02-21 DIAGNOSIS — F419 Anxiety disorder, unspecified: Secondary | ICD-10-CM | POA: Diagnosis not present

## 2024-02-21 DIAGNOSIS — R32 Unspecified urinary incontinence: Secondary | ICD-10-CM | POA: Diagnosis not present

## 2024-02-24 DIAGNOSIS — F02811 Dementia in other diseases classified elsewhere, unspecified severity, with agitation: Secondary | ICD-10-CM | POA: Diagnosis not present

## 2024-02-24 DIAGNOSIS — F419 Anxiety disorder, unspecified: Secondary | ICD-10-CM | POA: Diagnosis not present

## 2024-02-24 DIAGNOSIS — R32 Unspecified urinary incontinence: Secondary | ICD-10-CM | POA: Diagnosis not present

## 2024-02-24 DIAGNOSIS — F32A Depression, unspecified: Secondary | ICD-10-CM | POA: Diagnosis not present

## 2024-02-24 DIAGNOSIS — G309 Alzheimer's disease, unspecified: Secondary | ICD-10-CM | POA: Diagnosis not present

## 2024-02-24 DIAGNOSIS — R159 Full incontinence of feces: Secondary | ICD-10-CM | POA: Diagnosis not present

## 2024-02-27 DIAGNOSIS — G309 Alzheimer's disease, unspecified: Secondary | ICD-10-CM | POA: Diagnosis not present

## 2024-02-27 DIAGNOSIS — F419 Anxiety disorder, unspecified: Secondary | ICD-10-CM | POA: Diagnosis not present

## 2024-02-27 DIAGNOSIS — F02811 Dementia in other diseases classified elsewhere, unspecified severity, with agitation: Secondary | ICD-10-CM | POA: Diagnosis not present

## 2024-02-27 DIAGNOSIS — R32 Unspecified urinary incontinence: Secondary | ICD-10-CM | POA: Diagnosis not present

## 2024-02-27 DIAGNOSIS — F32A Depression, unspecified: Secondary | ICD-10-CM | POA: Diagnosis not present

## 2024-02-27 DIAGNOSIS — R159 Full incontinence of feces: Secondary | ICD-10-CM | POA: Diagnosis not present

## 2024-02-28 DIAGNOSIS — R159 Full incontinence of feces: Secondary | ICD-10-CM | POA: Diagnosis not present

## 2024-02-28 DIAGNOSIS — R32 Unspecified urinary incontinence: Secondary | ICD-10-CM | POA: Diagnosis not present

## 2024-02-28 DIAGNOSIS — F419 Anxiety disorder, unspecified: Secondary | ICD-10-CM | POA: Diagnosis not present

## 2024-02-28 DIAGNOSIS — F02811 Dementia in other diseases classified elsewhere, unspecified severity, with agitation: Secondary | ICD-10-CM | POA: Diagnosis not present

## 2024-02-28 DIAGNOSIS — F32A Depression, unspecified: Secondary | ICD-10-CM | POA: Diagnosis not present

## 2024-02-28 DIAGNOSIS — G309 Alzheimer's disease, unspecified: Secondary | ICD-10-CM | POA: Diagnosis not present

## 2024-02-29 DIAGNOSIS — G309 Alzheimer's disease, unspecified: Secondary | ICD-10-CM | POA: Diagnosis not present

## 2024-02-29 DIAGNOSIS — F32A Depression, unspecified: Secondary | ICD-10-CM | POA: Diagnosis not present

## 2024-02-29 DIAGNOSIS — F02811 Dementia in other diseases classified elsewhere, unspecified severity, with agitation: Secondary | ICD-10-CM | POA: Diagnosis not present

## 2024-02-29 DIAGNOSIS — R32 Unspecified urinary incontinence: Secondary | ICD-10-CM | POA: Diagnosis not present

## 2024-02-29 DIAGNOSIS — F419 Anxiety disorder, unspecified: Secondary | ICD-10-CM | POA: Diagnosis not present

## 2024-02-29 DIAGNOSIS — R159 Full incontinence of feces: Secondary | ICD-10-CM | POA: Diagnosis not present

## 2024-03-05 DIAGNOSIS — R159 Full incontinence of feces: Secondary | ICD-10-CM | POA: Diagnosis not present

## 2024-03-05 DIAGNOSIS — G309 Alzheimer's disease, unspecified: Secondary | ICD-10-CM | POA: Diagnosis not present

## 2024-03-05 DIAGNOSIS — F419 Anxiety disorder, unspecified: Secondary | ICD-10-CM | POA: Diagnosis not present

## 2024-03-05 DIAGNOSIS — F32A Depression, unspecified: Secondary | ICD-10-CM | POA: Diagnosis not present

## 2024-03-05 DIAGNOSIS — F02811 Dementia in other diseases classified elsewhere, unspecified severity, with agitation: Secondary | ICD-10-CM | POA: Diagnosis not present

## 2024-03-05 DIAGNOSIS — R32 Unspecified urinary incontinence: Secondary | ICD-10-CM | POA: Diagnosis not present

## 2024-03-06 DIAGNOSIS — F32A Depression, unspecified: Secondary | ICD-10-CM | POA: Diagnosis not present

## 2024-03-06 DIAGNOSIS — G309 Alzheimer's disease, unspecified: Secondary | ICD-10-CM | POA: Diagnosis not present

## 2024-03-06 DIAGNOSIS — F02811 Dementia in other diseases classified elsewhere, unspecified severity, with agitation: Secondary | ICD-10-CM | POA: Diagnosis not present

## 2024-03-06 DIAGNOSIS — R32 Unspecified urinary incontinence: Secondary | ICD-10-CM | POA: Diagnosis not present

## 2024-03-06 DIAGNOSIS — F419 Anxiety disorder, unspecified: Secondary | ICD-10-CM | POA: Diagnosis not present

## 2024-03-06 DIAGNOSIS — R159 Full incontinence of feces: Secondary | ICD-10-CM | POA: Diagnosis not present

## 2024-03-09 DIAGNOSIS — F32A Depression, unspecified: Secondary | ICD-10-CM | POA: Diagnosis not present

## 2024-03-09 DIAGNOSIS — F419 Anxiety disorder, unspecified: Secondary | ICD-10-CM | POA: Diagnosis not present

## 2024-03-09 DIAGNOSIS — R159 Full incontinence of feces: Secondary | ICD-10-CM | POA: Diagnosis not present

## 2024-03-09 DIAGNOSIS — F02811 Dementia in other diseases classified elsewhere, unspecified severity, with agitation: Secondary | ICD-10-CM | POA: Diagnosis not present

## 2024-03-09 DIAGNOSIS — R32 Unspecified urinary incontinence: Secondary | ICD-10-CM | POA: Diagnosis not present

## 2024-03-09 DIAGNOSIS — G309 Alzheimer's disease, unspecified: Secondary | ICD-10-CM | POA: Diagnosis not present

## 2024-03-10 DIAGNOSIS — Z66 Do not resuscitate: Secondary | ICD-10-CM | POA: Diagnosis not present

## 2024-03-10 DIAGNOSIS — I10 Essential (primary) hypertension: Secondary | ICD-10-CM | POA: Diagnosis not present

## 2024-03-10 DIAGNOSIS — M810 Age-related osteoporosis without current pathological fracture: Secondary | ICD-10-CM | POA: Diagnosis not present

## 2024-03-10 DIAGNOSIS — F32A Depression, unspecified: Secondary | ICD-10-CM | POA: Diagnosis not present

## 2024-03-10 DIAGNOSIS — Z87891 Personal history of nicotine dependence: Secondary | ICD-10-CM | POA: Diagnosis not present

## 2024-03-10 DIAGNOSIS — R296 Repeated falls: Secondary | ICD-10-CM | POA: Diagnosis not present

## 2024-03-10 DIAGNOSIS — F419 Anxiety disorder, unspecified: Secondary | ICD-10-CM | POA: Diagnosis not present

## 2024-03-10 DIAGNOSIS — Z993 Dependence on wheelchair: Secondary | ICD-10-CM | POA: Diagnosis not present

## 2024-03-10 DIAGNOSIS — Z87440 Personal history of urinary (tract) infections: Secondary | ICD-10-CM | POA: Diagnosis not present

## 2024-03-10 DIAGNOSIS — F02811 Dementia in other diseases classified elsewhere, unspecified severity, with agitation: Secondary | ICD-10-CM | POA: Diagnosis not present

## 2024-03-10 DIAGNOSIS — R32 Unspecified urinary incontinence: Secondary | ICD-10-CM | POA: Diagnosis not present

## 2024-03-10 DIAGNOSIS — Z8673 Personal history of transient ischemic attack (TIA), and cerebral infarction without residual deficits: Secondary | ICD-10-CM | POA: Diagnosis not present

## 2024-03-10 DIAGNOSIS — G309 Alzheimer's disease, unspecified: Secondary | ICD-10-CM | POA: Diagnosis not present

## 2024-03-10 DIAGNOSIS — E785 Hyperlipidemia, unspecified: Secondary | ICD-10-CM | POA: Diagnosis not present

## 2024-03-10 DIAGNOSIS — R159 Full incontinence of feces: Secondary | ICD-10-CM | POA: Diagnosis not present

## 2024-03-12 DIAGNOSIS — F32A Depression, unspecified: Secondary | ICD-10-CM | POA: Diagnosis not present

## 2024-03-12 DIAGNOSIS — R159 Full incontinence of feces: Secondary | ICD-10-CM | POA: Diagnosis not present

## 2024-03-12 DIAGNOSIS — F419 Anxiety disorder, unspecified: Secondary | ICD-10-CM | POA: Diagnosis not present

## 2024-03-12 DIAGNOSIS — R32 Unspecified urinary incontinence: Secondary | ICD-10-CM | POA: Diagnosis not present

## 2024-03-12 DIAGNOSIS — F02811 Dementia in other diseases classified elsewhere, unspecified severity, with agitation: Secondary | ICD-10-CM | POA: Diagnosis not present

## 2024-03-12 DIAGNOSIS — G309 Alzheimer's disease, unspecified: Secondary | ICD-10-CM | POA: Diagnosis not present

## 2024-03-13 DIAGNOSIS — R159 Full incontinence of feces: Secondary | ICD-10-CM | POA: Diagnosis not present

## 2024-03-13 DIAGNOSIS — F419 Anxiety disorder, unspecified: Secondary | ICD-10-CM | POA: Diagnosis not present

## 2024-03-13 DIAGNOSIS — F02811 Dementia in other diseases classified elsewhere, unspecified severity, with agitation: Secondary | ICD-10-CM | POA: Diagnosis not present

## 2024-03-13 DIAGNOSIS — R32 Unspecified urinary incontinence: Secondary | ICD-10-CM | POA: Diagnosis not present

## 2024-03-13 DIAGNOSIS — F32A Depression, unspecified: Secondary | ICD-10-CM | POA: Diagnosis not present

## 2024-03-13 DIAGNOSIS — G309 Alzheimer's disease, unspecified: Secondary | ICD-10-CM | POA: Diagnosis not present

## 2024-03-16 DIAGNOSIS — G309 Alzheimer's disease, unspecified: Secondary | ICD-10-CM | POA: Diagnosis not present

## 2024-03-16 DIAGNOSIS — R159 Full incontinence of feces: Secondary | ICD-10-CM | POA: Diagnosis not present

## 2024-03-16 DIAGNOSIS — F32A Depression, unspecified: Secondary | ICD-10-CM | POA: Diagnosis not present

## 2024-03-16 DIAGNOSIS — F419 Anxiety disorder, unspecified: Secondary | ICD-10-CM | POA: Diagnosis not present

## 2024-03-16 DIAGNOSIS — F02811 Dementia in other diseases classified elsewhere, unspecified severity, with agitation: Secondary | ICD-10-CM | POA: Diagnosis not present

## 2024-03-16 DIAGNOSIS — R32 Unspecified urinary incontinence: Secondary | ICD-10-CM | POA: Diagnosis not present

## 2024-03-19 DIAGNOSIS — F419 Anxiety disorder, unspecified: Secondary | ICD-10-CM | POA: Diagnosis not present

## 2024-03-19 DIAGNOSIS — F02811 Dementia in other diseases classified elsewhere, unspecified severity, with agitation: Secondary | ICD-10-CM | POA: Diagnosis not present

## 2024-03-19 DIAGNOSIS — G309 Alzheimer's disease, unspecified: Secondary | ICD-10-CM | POA: Diagnosis not present

## 2024-03-19 DIAGNOSIS — F32A Depression, unspecified: Secondary | ICD-10-CM | POA: Diagnosis not present

## 2024-03-19 DIAGNOSIS — R159 Full incontinence of feces: Secondary | ICD-10-CM | POA: Diagnosis not present

## 2024-03-19 DIAGNOSIS — R32 Unspecified urinary incontinence: Secondary | ICD-10-CM | POA: Diagnosis not present

## 2024-03-20 DIAGNOSIS — G309 Alzheimer's disease, unspecified: Secondary | ICD-10-CM | POA: Diagnosis not present

## 2024-03-20 DIAGNOSIS — R32 Unspecified urinary incontinence: Secondary | ICD-10-CM | POA: Diagnosis not present

## 2024-03-20 DIAGNOSIS — F419 Anxiety disorder, unspecified: Secondary | ICD-10-CM | POA: Diagnosis not present

## 2024-03-20 DIAGNOSIS — F32A Depression, unspecified: Secondary | ICD-10-CM | POA: Diagnosis not present

## 2024-03-20 DIAGNOSIS — R159 Full incontinence of feces: Secondary | ICD-10-CM | POA: Diagnosis not present

## 2024-03-20 DIAGNOSIS — F02811 Dementia in other diseases classified elsewhere, unspecified severity, with agitation: Secondary | ICD-10-CM | POA: Diagnosis not present

## 2024-03-22 ENCOUNTER — Encounter

## 2024-03-23 DIAGNOSIS — F02811 Dementia in other diseases classified elsewhere, unspecified severity, with agitation: Secondary | ICD-10-CM | POA: Diagnosis not present

## 2024-03-23 DIAGNOSIS — R32 Unspecified urinary incontinence: Secondary | ICD-10-CM | POA: Diagnosis not present

## 2024-03-23 DIAGNOSIS — F419 Anxiety disorder, unspecified: Secondary | ICD-10-CM | POA: Diagnosis not present

## 2024-03-23 DIAGNOSIS — R159 Full incontinence of feces: Secondary | ICD-10-CM | POA: Diagnosis not present

## 2024-03-23 DIAGNOSIS — F32A Depression, unspecified: Secondary | ICD-10-CM | POA: Diagnosis not present

## 2024-03-23 DIAGNOSIS — G309 Alzheimer's disease, unspecified: Secondary | ICD-10-CM | POA: Diagnosis not present

## 2024-03-26 DIAGNOSIS — G309 Alzheimer's disease, unspecified: Secondary | ICD-10-CM | POA: Diagnosis not present

## 2024-03-26 DIAGNOSIS — F32A Depression, unspecified: Secondary | ICD-10-CM | POA: Diagnosis not present

## 2024-03-26 DIAGNOSIS — R32 Unspecified urinary incontinence: Secondary | ICD-10-CM | POA: Diagnosis not present

## 2024-03-26 DIAGNOSIS — F419 Anxiety disorder, unspecified: Secondary | ICD-10-CM | POA: Diagnosis not present

## 2024-03-26 DIAGNOSIS — F02811 Dementia in other diseases classified elsewhere, unspecified severity, with agitation: Secondary | ICD-10-CM | POA: Diagnosis not present

## 2024-03-26 DIAGNOSIS — R159 Full incontinence of feces: Secondary | ICD-10-CM | POA: Diagnosis not present

## 2024-03-27 DIAGNOSIS — R159 Full incontinence of feces: Secondary | ICD-10-CM | POA: Diagnosis not present

## 2024-03-27 DIAGNOSIS — F419 Anxiety disorder, unspecified: Secondary | ICD-10-CM | POA: Diagnosis not present

## 2024-03-27 DIAGNOSIS — R32 Unspecified urinary incontinence: Secondary | ICD-10-CM | POA: Diagnosis not present

## 2024-03-27 DIAGNOSIS — F32A Depression, unspecified: Secondary | ICD-10-CM | POA: Diagnosis not present

## 2024-03-27 DIAGNOSIS — F02811 Dementia in other diseases classified elsewhere, unspecified severity, with agitation: Secondary | ICD-10-CM | POA: Diagnosis not present

## 2024-03-27 DIAGNOSIS — G309 Alzheimer's disease, unspecified: Secondary | ICD-10-CM | POA: Diagnosis not present

## 2024-03-30 DIAGNOSIS — F32A Depression, unspecified: Secondary | ICD-10-CM | POA: Diagnosis not present

## 2024-03-30 DIAGNOSIS — G309 Alzheimer's disease, unspecified: Secondary | ICD-10-CM | POA: Diagnosis not present

## 2024-03-30 DIAGNOSIS — F419 Anxiety disorder, unspecified: Secondary | ICD-10-CM | POA: Diagnosis not present

## 2024-03-30 DIAGNOSIS — R159 Full incontinence of feces: Secondary | ICD-10-CM | POA: Diagnosis not present

## 2024-03-30 DIAGNOSIS — F02811 Dementia in other diseases classified elsewhere, unspecified severity, with agitation: Secondary | ICD-10-CM | POA: Diagnosis not present

## 2024-03-30 DIAGNOSIS — R32 Unspecified urinary incontinence: Secondary | ICD-10-CM | POA: Diagnosis not present

## 2024-04-02 DIAGNOSIS — R32 Unspecified urinary incontinence: Secondary | ICD-10-CM | POA: Diagnosis not present

## 2024-04-02 DIAGNOSIS — F02811 Dementia in other diseases classified elsewhere, unspecified severity, with agitation: Secondary | ICD-10-CM | POA: Diagnosis not present

## 2024-04-02 DIAGNOSIS — R159 Full incontinence of feces: Secondary | ICD-10-CM | POA: Diagnosis not present

## 2024-04-02 DIAGNOSIS — F419 Anxiety disorder, unspecified: Secondary | ICD-10-CM | POA: Diagnosis not present

## 2024-04-02 DIAGNOSIS — G309 Alzheimer's disease, unspecified: Secondary | ICD-10-CM | POA: Diagnosis not present

## 2024-04-02 DIAGNOSIS — F32A Depression, unspecified: Secondary | ICD-10-CM | POA: Diagnosis not present

## 2024-04-03 DIAGNOSIS — F419 Anxiety disorder, unspecified: Secondary | ICD-10-CM | POA: Diagnosis not present

## 2024-04-03 DIAGNOSIS — R159 Full incontinence of feces: Secondary | ICD-10-CM | POA: Diagnosis not present

## 2024-04-03 DIAGNOSIS — R32 Unspecified urinary incontinence: Secondary | ICD-10-CM | POA: Diagnosis not present

## 2024-04-03 DIAGNOSIS — G309 Alzheimer's disease, unspecified: Secondary | ICD-10-CM | POA: Diagnosis not present

## 2024-04-03 DIAGNOSIS — F02811 Dementia in other diseases classified elsewhere, unspecified severity, with agitation: Secondary | ICD-10-CM | POA: Diagnosis not present

## 2024-04-03 DIAGNOSIS — F32A Depression, unspecified: Secondary | ICD-10-CM | POA: Diagnosis not present

## 2024-04-04 DIAGNOSIS — R159 Full incontinence of feces: Secondary | ICD-10-CM | POA: Diagnosis not present

## 2024-04-04 DIAGNOSIS — F02811 Dementia in other diseases classified elsewhere, unspecified severity, with agitation: Secondary | ICD-10-CM | POA: Diagnosis not present

## 2024-04-04 DIAGNOSIS — R32 Unspecified urinary incontinence: Secondary | ICD-10-CM | POA: Diagnosis not present

## 2024-04-04 DIAGNOSIS — F419 Anxiety disorder, unspecified: Secondary | ICD-10-CM | POA: Diagnosis not present

## 2024-04-04 DIAGNOSIS — F32A Depression, unspecified: Secondary | ICD-10-CM | POA: Diagnosis not present

## 2024-04-04 DIAGNOSIS — G309 Alzheimer's disease, unspecified: Secondary | ICD-10-CM | POA: Diagnosis not present

## 2024-04-08 DIAGNOSIS — F02811 Dementia in other diseases classified elsewhere, unspecified severity, with agitation: Secondary | ICD-10-CM | POA: Diagnosis not present

## 2024-04-08 DIAGNOSIS — F419 Anxiety disorder, unspecified: Secondary | ICD-10-CM | POA: Diagnosis not present

## 2024-04-08 DIAGNOSIS — R159 Full incontinence of feces: Secondary | ICD-10-CM | POA: Diagnosis not present

## 2024-04-08 DIAGNOSIS — G309 Alzheimer's disease, unspecified: Secondary | ICD-10-CM | POA: Diagnosis not present

## 2024-04-08 DIAGNOSIS — F32A Depression, unspecified: Secondary | ICD-10-CM | POA: Diagnosis not present

## 2024-04-08 DIAGNOSIS — R32 Unspecified urinary incontinence: Secondary | ICD-10-CM | POA: Diagnosis not present

## 2024-04-22 ENCOUNTER — Encounter

## 2024-04-23 ENCOUNTER — Encounter

## 2024-05-23 ENCOUNTER — Encounter

## 2024-05-24 ENCOUNTER — Encounter

## 2024-06-23 ENCOUNTER — Ambulatory Visit

## 2024-07-24 ENCOUNTER — Ambulatory Visit

## 2024-08-24 ENCOUNTER — Ambulatory Visit

## 2024-09-24 ENCOUNTER — Ambulatory Visit

## 2024-10-25 ENCOUNTER — Ambulatory Visit
